# Patient Record
Sex: Female | Born: 1945
Health system: Southern US, Community
[De-identification: ages and names within clinical notes are randomized; demographics above are authoritative.]

## PROBLEM LIST (undated history)

## (undated) DIAGNOSIS — M542 Cervicalgia: Secondary | ICD-10-CM

## (undated) DIAGNOSIS — J449 Chronic obstructive pulmonary disease, unspecified: Secondary | ICD-10-CM

## (undated) DIAGNOSIS — F419 Anxiety disorder, unspecified: Secondary | ICD-10-CM

## (undated) DIAGNOSIS — I2510 Atherosclerotic heart disease of native coronary artery without angina pectoris: Secondary | ICD-10-CM

## (undated) DIAGNOSIS — I639 Cerebral infarction, unspecified: Secondary | ICD-10-CM

## (undated) DIAGNOSIS — R7303 Prediabetes: Secondary | ICD-10-CM

## (undated) DIAGNOSIS — E7800 Pure hypercholesterolemia, unspecified: Secondary | ICD-10-CM

## (undated) DIAGNOSIS — F17200 Nicotine dependence, unspecified, uncomplicated: Secondary | ICD-10-CM

## (undated) DIAGNOSIS — K5909 Other constipation: Secondary | ICD-10-CM

## (undated) DIAGNOSIS — R0902 Hypoxemia: Secondary | ICD-10-CM

## (undated) DIAGNOSIS — Z8673 Personal history of transient ischemic attack (TIA), and cerebral infarction without residual deficits: Secondary | ICD-10-CM

## (undated) DIAGNOSIS — J439 Emphysema, unspecified: Secondary | ICD-10-CM

## (undated) DIAGNOSIS — K219 Gastro-esophageal reflux disease without esophagitis: Secondary | ICD-10-CM

## (undated) DIAGNOSIS — E119 Type 2 diabetes mellitus without complications: Secondary | ICD-10-CM

## (undated) DIAGNOSIS — Z860100 Personal history of colon polyps, unspecified: Secondary | ICD-10-CM

## (undated) DIAGNOSIS — M549 Dorsalgia, unspecified: Secondary | ICD-10-CM

## (undated) DIAGNOSIS — D689 Coagulation defect, unspecified: Secondary | ICD-10-CM

## (undated) DIAGNOSIS — H409 Unspecified glaucoma: Secondary | ICD-10-CM

## (undated) DIAGNOSIS — M810 Age-related osteoporosis without current pathological fracture: Secondary | ICD-10-CM

## (undated) DIAGNOSIS — G709 Myoneural disorder, unspecified: Secondary | ICD-10-CM

## (undated) DIAGNOSIS — K5730 Diverticulosis of large intestine without perforation or abscess without bleeding: Secondary | ICD-10-CM

## (undated) DIAGNOSIS — C3490 Malignant neoplasm of unspecified part of unspecified bronchus or lung: Secondary | ICD-10-CM

## (undated) DIAGNOSIS — M1990 Unspecified osteoarthritis, unspecified site: Secondary | ICD-10-CM

## (undated) DIAGNOSIS — D735 Infarction of spleen: Secondary | ICD-10-CM

## (undated) DIAGNOSIS — H269 Unspecified cataract: Secondary | ICD-10-CM

## (undated) DIAGNOSIS — R0789 Other chest pain: Secondary | ICD-10-CM

## (undated) DIAGNOSIS — K224 Dyskinesia of esophagus: Secondary | ICD-10-CM

## (undated) DIAGNOSIS — Z8619 Personal history of other infectious and parasitic diseases: Secondary | ICD-10-CM

## (undated) HISTORY — DX: Personal history of other infectious and parasitic diseases: Z86.19

## (undated) HISTORY — DX: Atherosclerotic heart disease of native coronary artery without angina pectoris: I25.10

## (undated) HISTORY — DX: Type 2 diabetes mellitus without complications: E11.9

## (undated) HISTORY — PX: TUBAL LIGATION: SHX77

## (undated) HISTORY — DX: Coagulation defect, unspecified: D68.9

## (undated) HISTORY — DX: Personal history of colon polyps, unspecified: Z86.0100

## (undated) HISTORY — DX: Cerebral infarction, unspecified: I63.9

## (undated) HISTORY — DX: Unspecified cataract: H26.9

## (undated) HISTORY — DX: Personal history of colonic polyps: Z86.010

## (undated) HISTORY — DX: Infarction of spleen: D73.5

## (undated) HISTORY — DX: Dorsalgia, unspecified: M54.9

## (undated) HISTORY — DX: Unspecified glaucoma: H40.9

## (undated) HISTORY — DX: Pure hypercholesterolemia, unspecified: E78.00

## (undated) HISTORY — PX: CHOLECYSTECTOMY: SHX55

## (undated) HISTORY — DX: Cervicalgia: M54.2

## (undated) HISTORY — DX: Chronic obstructive pulmonary disease, unspecified: J44.9

## (undated) HISTORY — PX: BACK SURGERY: SHX140

## (undated) HISTORY — DX: Unspecified osteoarthritis, unspecified site: M19.90

## (undated) HISTORY — DX: Malignant neoplasm of unspecified part of unspecified bronchus or lung: C34.90

## (undated) HISTORY — DX: Gastro-esophageal reflux disease without esophagitis: K21.9

## (undated) HISTORY — DX: Age-related osteoporosis without current pathological fracture: M81.0

## (undated) HISTORY — DX: Nicotine dependence, unspecified, uncomplicated: F17.200

## (undated) HISTORY — DX: Myoneural disorder, unspecified: G70.9

## (undated) HISTORY — DX: Anxiety disorder, unspecified: F41.9

## (undated) HISTORY — DX: Emphysema, unspecified: J43.9

## (undated) HISTORY — DX: Hypoxemia: R09.02

## (undated) HISTORY — DX: Personal history of transient ischemic attack (TIA), and cerebral infarction without residual deficits: Z86.73

## (undated) HISTORY — DX: Other chest pain: R07.89

## (undated) HISTORY — DX: Diverticulosis of large intestine without perforation or abscess without bleeding: K57.30

## (undated) HISTORY — DX: Dyskinesia of esophagus: K22.4

## (undated) HISTORY — DX: Other constipation: K59.09

## (undated) HISTORY — DX: Prediabetes: R73.03

---

## 2007-07-28 ENCOUNTER — Observation Stay (HOSPITAL_COMMUNITY): Admission: RE | Admit: 2007-07-28 | Discharge: 2007-07-29 | Payer: Self-pay | Admitting: Neurosurgery

## 2008-05-09 ENCOUNTER — Emergency Department (HOSPITAL_COMMUNITY): Admission: EM | Admit: 2008-05-09 | Discharge: 2008-05-09 | Payer: Self-pay | Admitting: Emergency Medicine

## 2008-05-09 ENCOUNTER — Ambulatory Visit: Payer: Self-pay | Admitting: Vascular Surgery

## 2008-05-17 ENCOUNTER — Ambulatory Visit: Payer: Self-pay | Admitting: Vascular Surgery

## 2008-06-11 ENCOUNTER — Ambulatory Visit (HOSPITAL_COMMUNITY): Admission: RE | Admit: 2008-06-11 | Discharge: 2008-06-11 | Payer: Self-pay | Admitting: Family Medicine

## 2008-06-26 ENCOUNTER — Ambulatory Visit: Payer: Self-pay | Admitting: Vascular Surgery

## 2008-07-02 ENCOUNTER — Ambulatory Visit: Payer: Self-pay | Admitting: Vascular Surgery

## 2008-07-02 ENCOUNTER — Ambulatory Visit (HOSPITAL_COMMUNITY): Admission: RE | Admit: 2008-07-02 | Discharge: 2008-07-02 | Payer: Self-pay | Admitting: Vascular Surgery

## 2010-08-26 LAB — POCT I-STAT, CHEM 8
Chloride: 110 mEq/L (ref 96–112)
Glucose, Bld: 102 mg/dL — ABNORMAL HIGH (ref 70–99)
HCT: 46 % (ref 36.0–46.0)
Hemoglobin: 15.6 g/dL — ABNORMAL HIGH (ref 12.0–15.0)
Potassium: 3.9 mEq/L (ref 3.5–5.1)
Sodium: 142 mEq/L (ref 135–145)

## 2010-09-23 NOTE — Op Note (Signed)
Bailey Nguyen, Bailey Nguyen              ACCOUNT NO.:  [id number]   MEDICAL RECORD NO.:  [id number]          PATIENT TYPE:  AMB   LOCATION:  SDS                          FACILITY:  [facility]   PHYSICIAN:  Di Kindle. Edilia Bo, M.D.DATE OF BIRTH:  1945/07/11   DATE OF PROCEDURE:  07/02/2008  DATE OF DISCHARGE:  07/02/2008                               OPERATIVE REPORT   PREOPERATIVE DIAGNOSIS:  Pain in the left foot.   POSTOPERATIVE DIAGNOSIS:  Pain in the left foot.   PROCEDURE:  1. Ultrasound-guided access to the right common femoral artery.  2. Thoracic aortogram.  3. Aortogram with bilateral iliac arteriogram and bilateral lower      extremity runoff.  4. Selective catheterization of the left external iliac artery.   SURGEON:  Di Kindle. Edilia Bo, MD   ANESTHESIA:  Local with sedation.   INDICATIONS:  This is a pleasant 65 year old woman who developed fairly  sudden onset of left calf pain and then subsequently pain in the dorsum  of her foot in late December 2009.  She had no history of atrial  fibrillation or myocardial infarction or other potential source of  embolization.  She later had presented to the emergency department  because of persistent pain and at that time had good Doppler flow in the  anterior tibial and posterior tibial positions.  Her pain persisted and  she is brought in for diagnostic arteriography to assess for possible  embolic disease to the left leg.  Of note, in January, 2010, she had  ABIs of 100% bilaterally with triphasic Doppler signals noted in the  posterior tibial and anterior tibial positions on the right and biphasic  signals noted on the left.   TECHNIQUE:  The patient was taken to the [facility] and sedated with a  milligram of Versed and 50 mcg of fentanyl.  Both groins were prepped  and draped in the usual sterile fashion.  After the skin was infiltrated  with 1% lidocaine and under ultrasound guidance, the right common  femoral artery was  cannulated and guidewire was introduced into the  infrarenal aorta under fluoroscopic control.  A 5-French sheath was  introduced over the wire.  Pigtail catheter was positioned at the L1  vertebral body and flush aortogram was obtained.  The catheter was then  positioned above the aortic bifurcation after a lateral projection was  obtained and oblique iliac projections were obtained.  Next, the pigtail  catheter was exchanged for an IMA catheter which was positioned into the  left common iliac artery.  The wire was advanced to the common femoral  artery and then the IMA catheter as exchanged for an end-hole catheter  which was advanced down into the external iliac artery on the left.  Selective left external iliac arteriogram was obtained and left lower  extremity runoff.  Next, the end-hole catheter was exchanged for a  pigtail catheter which was positioned in the proximal descending  thoracic aorta and thoracic aortogram was obtained.  The pigtail  catheter was then removed over the wire.  Additional right lower  extremity films were obtained through the  right femoral sheath.   FINDINGS:  The thoracic aorta is widely patent without evidence of  atherosclerotic disease and no evidence of the source for embolization.  There are single renal arteries bilaterally with no significant renal  artery stenosis identified.  The infrarenal aorta, bilateral common  iliac arteries, bilateral hypogastric arteries, and bilateral external  iliac arteries are widely patent without evidence of atherosclerosis.  On the right side, the common femoral, deep femoral, superficial  femoropopliteal, anterior tib, posterior tibial, and peroneal arteries  are all patent with no significant infrainguinal arterial occlusive  disease.   On the left side, the common femoral, superficial femoral, and deep  femoropopliteal vessels are all patent without evidence of  atherosclerotic disease or potential source of  embolization.  The  anterior tibial artery is occluded at the ankle.  The posterior tibial  artery is occluded just above the ankle.  There are collaterals in the  foot.   CONCLUSIONS:  No evidence of potential source for atheroembolic disease  from the descending thoracic aorta down to the ankle on the left.  Abrupt occlusion of the dorsalis pedis artery and posterior tibial  artery at the ankle on the left.  No significant infrainguinal arterial  occlusive disease on the right.  This patient appears to have evidence  of atheroembolic disease to the distal vessels on the left with no  arterial source identified.  A 2-D echo or transesophageal echo would be  recommended to evaluate for potential embolic source.  If this was  unremarkable, the patient should be considered for hypercoagulable  workup.      Di Kindle. Edilia Bo, M.D.  Electronically Signed     CSD/MEDQ  D:  07/02/2008  T:  07/02/2008  Job:  [id number]   cc:   Burnell Blanks, MD  Lonie Peak, PA-C

## 2010-09-23 NOTE — Consult Note (Signed)
Bailey Nguyen, COLTRAIN NO.:  [id number]   MEDICAL RECORD NO.:  [id number]          PATIENT TYPE:  EMS   LOCATION:  MAJO                         FACILITY:  [facility]   PHYSICIAN:  Di Kindle. Edilia Bo, M.D.DATE OF BIRTH:  Dec 19, 1945   DATE OF CONSULTATION:  DATE OF DISCHARGE:                                 CONSULTATION   REASON FOR CONSULTATION:  Left foot pain.   HISTORY:  This is a 65 year old woman who approximately a week ago  developed a fairly sudden onset of left calf pain and then subsequently  the pain migrated to the dorsum of her left foot.  Pain has been  persistent and got to the point where she could not put weight on the  foot.  She went to the emergency department in __________ and the  physician there had discussed the patient with me because he was  concerned he could not palpate pulses.  I asked that the patient be  transferred to [facility]; however, she had signed out against medical advice.  She then drove to [facility] on her own.  She did not want to come by  ambulance.   Prior to developing the sudden onset of pain a week ago, she denied any  history of claudication, rest pain, or previous nonhealing ulcers.  She  has had no history of previous DVT or phlebitis.  She has no history of  atrial fibrillation or recent myocardial infarction that suggest a  source for embolization.  She notes that when she developed the pain in  her left calf, she did not notice any discoloration of her foot or  coolness to the foot.  She did undergo a venous duplex scan 4 days ago  according to the patient and this was reportedly unremarkable.  She  states that they evaluated the thigh and behind the knee it sounds like  they did not evaluate the calf.  She does not remember any specific  injury to the left foot.  She has had no symptoms on the right leg.   Her past medical history is significant for COPD.  She denies any  history of diabetes, hypertension,  hypercholesterolemia, or history of  previous myocardial infarction.   PAST SURGICAL HISTORY:  She has had lumbar back surgery in March 2009.   MEDICATIONS:  1. Nexium 40 mg p.o. b.i.d.  2. Zantac 150 mg p.o. nightly.  3. She also takes eye drops, she did not know the name.   SOCIAL HISTORY:  She is married.  She has four living children.  She  smokes one and a half packs per day of cigarettes and is smoking since  she was 65 year old.   REVIEW OF SYSTEMS:  GENERAL:  She has had no recent weight loss or  weight gain, problem with her appetite or fever.  CARDIAC:  She has had  no chest pain, chest pressure, palpitations, or arrhythmias.  PULMONARY:  She has had no productive cough, bronchitis, asthma, or wheezing.  She  does have a history of COPD.  GI:  She has had no recent change in her  bowel habits and has no history of peptic ulcer disease.  GU:  She has  had no dysuria or frequency.  HEMATOLOGIC:  She does tell me that in  December 2006, she had a splenic infarct presumably embolic.  She has no  clotting disorders that she is aware of.  NEURO:  She had no dizziness,  blackouts, headaches, or seizures.  VASCULAR:  She has had no  claudication, rest pain, or nonhealing ulcers.  She has had no previous  history of DVT or phlebitis.  ENT:  She has had no recent change in her  eyesight or hearing.  PSYCHIATRIC:  She has had no nervousness or  depression.   PHYSICAL EXAMINATION:  GENERAL:  This is a pleasant 65 year old woman  who appears her stated age.  VITAL SIGNS:  Her heart rate is 70, blood pressure is 120/60.  NECK:  Supple.  There is no cervical lymphadenopathy.  I do not detect  any carotid bruits.  LUNGS:  Clear bilaterally to auscultation.  CARDIAC:  She has a regular rate and rhythm without murmur appreciated.  ABDOMEN:  Soft and nontender.  I cannot palpate an aneurysm.  She has  palpable femoral pulses and palpable popliteal pulses.  On the left side  which is the  symptomatic side, she has a palpable posterior tibial  pulse.  I cannot palpate a dorsalis pedis pulse.  She does have Doppler  signals in the left foot and dorsalis pedis, posterior tibial, and  peroneal position.  These are monophasic.  On the right side, she has a  palpable dorsalis pedis and posterior tibial pulse.  Both feet appear  warm and well perfused, are pink in color, with good capillary refill.  There is no modeling or discoloration.  There is no evidence of  atheroembolic disease.  There is some mild cellulitis on the dorsum of  the foot on the left.  There is no significant lower extremity swelling.   This patient presents with left foot pain of an known etiology.  She is  not aware of any trauma to the area.  We will obtain an x-ray to rule  out a fracture or arthritis.  This could simply be arthritis or gout.  However, I have recommended that she obtain a followup duplex scan next  week.  If she had a small tibial clot which was missed on her initial  study, this would allow [place] to reassess this and also be sure there was  no propagation of clot if there was tibial clot, which was missed.  Less  likely she has had an embolic event to her tibials; however, she has a  palpable popliteal and posterior tibial pulse on the left with good  Doppler flow in the peroneal and posterior and dorsalis pedis artery.  If her symptoms persist and no other etiology can be found and her  venous duplex is unremarkable, then we could consider arteriography.  We  discussed potentially keeping her in the hospital and doing at this  admission; however, I felt that the yield was quite low and clearly she  had good perfusion of the foot at this point with a palpable posterior  tibial pulse.  She will follow up with her primary care physician  tomorrow and have given her notes, so that she can arrange the followup  venous duplex and ABIs.  I have also instructed her to take two Aleve  tonight and  one in the morning.  I have also  given her my card, so that  she can call for symptoms persisted.  We could consider arteriography.      Di Kindle. Edilia Bo, M.D.  Electronically Signed     CSD/MEDQ  D:  05/09/2008  T:  05/10/2008  Job:  [id number]

## 2010-09-23 NOTE — Assessment & Plan Note (Signed)
OFFICE VISIT   Bailey Nguyen, Bailey Nguyen  DOB:  1945-07-15                                       06/26/2008  ZOXWR#:[id number]   The patient was last seen in December of 2009.  At that time she stated  that she had developed a fairly sudden onset of left calf pain and then  subsequently pain on the dorsum of her left foot that began in late  December.  The pain was persistent and she was having difficulty putting  weight on her foot.  She denied any history of claudication, rest pain  or history of nonhealing ulcers.  She has had no previous history of DVT  or phlebitis.  She has had no history of atrial fibrillation or recent  myocardial infarction to suggest a source of embolization.  She has had  a venous duplex scan after this pain developed which showed no evidence  of DVT.   The patient's pain had improved somewhat but never resolved completely.  She did have a nuclear medicine study that showed that there was no  evidence of stress fracture.  However, she does complain that the foot  becomes quite cold and numb at times and she is continuing to have pain  on the dorsum of her foot with no real calf pain.   PAST MEDICAL HISTORY:  Significant for COPD.  She denies any history of  diabetes, hypertension, hypercholesterolemia, or history of previous  myocardial infarction.   SOCIAL HISTORY:  She smokes one and a half packs per day of cigarettes  and has been smoking since she was 16.   REVIEW OF SYSTEMS:  She has had no recent chest pain, chest pressure,  palpitations or arrhythmias.  She has had no bronchitis, asthma or  wheezing.   PHYSICAL EXAMINATION:  General:  On physical examination this is a  pleasant 65 year old woman who appears her stated age.  Vital signs:  Blood pressure is 129/76, heart rate is 72.  Neck:  Is supple.  I do not  detect any carotid bruits.  She has palpable femoral and popliteal  pulses bilaterally.  On the right side she has a  palpable dorsalis pedis  and posterior tibial pulse.  On the left side I cannot palpate a  dorsalis pedis or posterior tibial pulse.  She has a monophasic  posterior tibial, anterior tibial and peroneal signal on the left with  the Doppler.  The left was cooler than the right.   Of note, she did have Doppler studies in our office in January on  05/17/2008 which showed ABIs of greater than 100% bilaterally with  triphasic Doppler signals in the posterior tibial and anterior tibial  position on the right and biphasic Doppler signals in the posterior  tibial and anterior tibial positions on the left.   Based on my current exam I think that her exam has changed compared to  her previous exam in December of 2009.  I could palpate a posterior  tibial pulse before which I cannot palpate now.  In addition, she has  monophasic signals in the left foot.  Given that there has been no clear  cut etiology for this pain which has been somewhat puzzling I have  recommended we proceed with arteriography to further assess this.  She  could potentially have distal atherosclerotic disease related to her  smoking or possibly embolic disease although she has had no good reason  to embolize unless there is a proximal lesion.  I have discussed the  indication for arteriography and the potential complications of the  procedure including but not limited to arterial injury, bleeding and  renal insufficiency.  All of her questions were answered.  She is  agreeable to proceed.  The procedure has been scheduled for 07/02/2008.  I explained that if we did find any stenosis amenable to angioplasty and  stenting this could potentially be addressed at the same time.  I will  make further recommendations pending these results.   Di Kindle. Edilia Bo, M.D.  Electronically Signed   CSD/MEDQ  D:  06/26/2008  T:  06/27/2008  Job:  [id number]   cc:   Lonie Peak

## 2010-09-23 NOTE — Op Note (Signed)
NAMEBERNADEAN, SALING NO.:  [id number]   MEDICAL RECORD NO.:  [id number]          PATIENT TYPE:  OBV   LOCATION:  3599                         FACILITY:  [facility]   PHYSICIAN:  Reinaldo Meeker, M.D. DATE OF BIRTH:  1946/01/23   DATE OF PROCEDURE:  07/28/2007  DATE OF DISCHARGE:                               OPERATIVE REPORT   PREOPERATIVE DIAGNOSIS:  Herniated disk L5-S1 left.   POSTOPERATIVE DIAGNOSIS:  Herniated disk L5-S1 left.   PROCEDURES:  1. Left L5-S1 interlaminar laminotomy for excision of herniated disk      with the operating microscope  2. Microdissection L5-S1 disk and S1 nerve root.   SURGEON:  Reinaldo Meeker, M.D.   ASSISTANT:  Donalee Citrin, M.D.   PROCEDURE IN DETAIL:  After being placed in the prone position, the  patient's back was prepped and draped in usual sterile fashion.  Localizing x-ray was taken prior to incision to identify the appropriate  level.  Midline incision was made over the spinous process of L5 and S1.  Using Bovie cutting current, the incision was carried down the spinous  processes.  Subperiosteal dissection was then carried out on left-sided  spinous processes and lamina and self retractor was placed for exposure.  X-ray showed approach of the appropriate level.  Using the high-speed  drill, the inferior half of the L5 lamina, medial third of facet joint  removed.  It was then used to remove the superior one third of the S1  lamina.  Residual bone, ligamentum flavum removed in a piecemeal  fashion.  Microscope was draped, brought in the field used and used  through the remainder of the case.  Using microdissection technique, the  lateral aspect of the thecal sac and S1 nerve root were identified.  Further coagulation was carried down the floor of the canal to identify  the L5-S1 disk which was found to be markedly herniated with the  superior fragment.  Fragment was then removed in the single piece.  The  disk space  entered with a 15 blade.  Using pituitary rongeurs and  curettes thorough disk space clean-out was carried out, while at the  same time great care was taken to avoid injury to the neural elements  and this was successfully done.  At this point, inspection was carried  out in all directions for any evidence of residual compression and none  could be identified.  Large amounts of irrigation was carried out.  Any  bleeding controlled with bipolar coagulation and Gelfoam.  The wound was  then closed in multiple layers of Vicryl on the muscle fascia,  subcutaneous and subcu tissues and staples were placed on the skin.  A  sterile dressing was then applied and the patient was extubated and  taken to the recovery room in stable condition.           ______________________________  Reinaldo Meeker, M.D.     ROK/MEDQ  D:  07/28/2007  T:  07/28/2007  Job:  [id number]

## 2010-09-26 NOTE — Letter (Signed)
August 21, 2008    Re:  Nguyen, Bailey                DOB:  [birth year]    Burnell Blanks, MD  [facility]  [address]  [address] [place], [place] [address]   Dear Dr. Nathanial Rancher,   This is a follow-up letter on Ms. Kittel.  I had seen her in consultation  concerning pain in the right foot, which was fairly sudden in onset.  We  felt that she could have potentially had some embolic disease, and she  underwent diagnostic arteriography on 07/02/08 to evaluate for a  potential of embolization.  There was no area of stenosis or  irregularity from the thoracic aorta down through the tibials  bilaterally to suggest a source of embolization; however, on the left  side where she was having pain, she did have an abrupt occlusion of the  posterior tibial and anterior tibial arteries at the level of the foot  with collaterals in the foot.   As no source of embolization could be found in her arteriogram, if she  has not had a 2D echo, I think this could potentially be useful.  I have  also previously had discussions with her about the importance of tobacco  cessation.  I plan on seeing her back p.r.n.   Di Kindle. Edilia Bo, M.D.  Electronically Signed   CSD/MEDQ  D:  08/21/2008  T:  08/22/2008  Job:  [id number]   cc:   Lonie Peak

## 2011-02-02 LAB — BASIC METABOLIC PANEL
Chloride: 107
GFR calc non Af Amer: >60
Potassium: 3.9
Sodium: 141

## 2011-02-02 LAB — CBC
HCT: 41.6
Hemoglobin: 14.5
MCV: 94.9
RBC: 4.39
WBC: 6.3

## 2011-12-02 ENCOUNTER — Ambulatory Visit: Payer: Self-pay | Admitting: Unknown Physician Specialty

## 2011-12-16 ENCOUNTER — Ambulatory Visit: Payer: Self-pay | Admitting: Unknown Physician Specialty

## 2012-02-25 ENCOUNTER — Other Ambulatory Visit: Payer: Self-pay | Admitting: Oncology

## 2012-06-10 DIAGNOSIS — Z79899 Other long term (current) drug therapy: Secondary | ICD-10-CM | POA: Diagnosis not present

## 2012-06-10 DIAGNOSIS — E7800 Pure hypercholesterolemia, unspecified: Secondary | ICD-10-CM | POA: Diagnosis not present

## 2012-06-10 DIAGNOSIS — M549 Dorsalgia, unspecified: Secondary | ICD-10-CM | POA: Diagnosis not present

## 2012-06-10 DIAGNOSIS — K5900 Constipation, unspecified: Secondary | ICD-10-CM | POA: Diagnosis not present

## 2012-06-28 DIAGNOSIS — Z1211 Encounter for screening for malignant neoplasm of colon: Secondary | ICD-10-CM | POA: Diagnosis not present

## 2012-06-28 DIAGNOSIS — K219 Gastro-esophageal reflux disease without esophagitis: Secondary | ICD-10-CM | POA: Diagnosis not present

## 2012-06-28 DIAGNOSIS — K5900 Constipation, unspecified: Secondary | ICD-10-CM | POA: Diagnosis not present

## 2012-08-09 DIAGNOSIS — N390 Urinary tract infection, site not specified: Secondary | ICD-10-CM | POA: Diagnosis not present

## 2012-08-09 DIAGNOSIS — Z6826 Body mass index (BMI) 26.0-26.9, adult: Secondary | ICD-10-CM | POA: Diagnosis not present

## 2012-08-12 DIAGNOSIS — I10 Essential (primary) hypertension: Secondary | ICD-10-CM | POA: Diagnosis not present

## 2012-08-12 DIAGNOSIS — R1013 Epigastric pain: Secondary | ICD-10-CM | POA: Diagnosis not present

## 2012-08-12 DIAGNOSIS — K5900 Constipation, unspecified: Secondary | ICD-10-CM | POA: Diagnosis not present

## 2012-08-12 DIAGNOSIS — R1012 Left upper quadrant pain: Secondary | ICD-10-CM | POA: Diagnosis not present

## 2012-08-12 DIAGNOSIS — R0602 Shortness of breath: Secondary | ICD-10-CM | POA: Diagnosis not present

## 2012-08-12 DIAGNOSIS — K219 Gastro-esophageal reflux disease without esophagitis: Secondary | ICD-10-CM | POA: Diagnosis not present

## 2012-08-12 DIAGNOSIS — Z79899 Other long term (current) drug therapy: Secondary | ICD-10-CM | POA: Diagnosis not present

## 2012-08-12 DIAGNOSIS — M549 Dorsalgia, unspecified: Secondary | ICD-10-CM | POA: Diagnosis not present

## 2012-08-16 DIAGNOSIS — R1012 Left upper quadrant pain: Secondary | ICD-10-CM | POA: Diagnosis not present

## 2012-08-16 DIAGNOSIS — M546 Pain in thoracic spine: Secondary | ICD-10-CM | POA: Diagnosis not present

## 2012-08-19 ENCOUNTER — Other Ambulatory Visit: Payer: Self-pay | Admitting: Family Medicine

## 2012-08-19 DIAGNOSIS — M546 Pain in thoracic spine: Secondary | ICD-10-CM

## 2012-08-20 ENCOUNTER — Ambulatory Visit
Admission: RE | Admit: 2012-08-20 | Discharge: 2012-08-20 | Disposition: A | Discharge status: Home / Self Care | Payer: Medicare Other | Source: Ambulatory Visit | Attending: Family Medicine | Admitting: Family Medicine

## 2012-08-20 DIAGNOSIS — M546 Pain in thoracic spine: Secondary | ICD-10-CM

## 2012-08-20 DIAGNOSIS — IMO0002 Reserved for concepts with insufficient information to code with codable children: Secondary | ICD-10-CM | POA: Diagnosis not present

## 2012-08-20 DIAGNOSIS — M5124 Other intervertebral disc displacement, thoracic region: Secondary | ICD-10-CM | POA: Diagnosis not present

## 2012-08-24 DIAGNOSIS — R1012 Left upper quadrant pain: Secondary | ICD-10-CM | POA: Diagnosis not present

## 2012-08-24 DIAGNOSIS — Z6826 Body mass index (BMI) 26.0-26.9, adult: Secondary | ICD-10-CM | POA: Diagnosis not present

## 2012-08-24 DIAGNOSIS — R1032 Left lower quadrant pain: Secondary | ICD-10-CM | POA: Diagnosis not present

## 2012-08-24 DIAGNOSIS — Z79899 Other long term (current) drug therapy: Secondary | ICD-10-CM | POA: Diagnosis not present

## 2012-11-21 DIAGNOSIS — J029 Acute pharyngitis, unspecified: Secondary | ICD-10-CM | POA: Diagnosis not present

## 2012-11-23 IMAGING — CT CT NECK WITH CONTRAST
2 series · 10 of 14 positions shown, 12 images · IV contrast (isovue)
Comparison: none

REASON FOR EXAM: abnormal US   cyst  seen on left of midline on neck
COMMENTS:

PROCEDURE:     KCT - KCT NECK WITH CONTRAST  - December 16, 2011 [DATE]
RESULT:     Comparison: Soft tissue ultrasound 12/02/2011
TECHNIQUE: Multiple sequential axial images from the apices of the lungs to
the level of the orbits obtained with 75 mL Isovue 370 IV contrast.

[Series 2: neck 3.0 3 · axial · 0.42mm/px · z∈[-253,-1]mm · 8 of 108 slices shown, 10 images]
[im 12/108  soft-tissue]
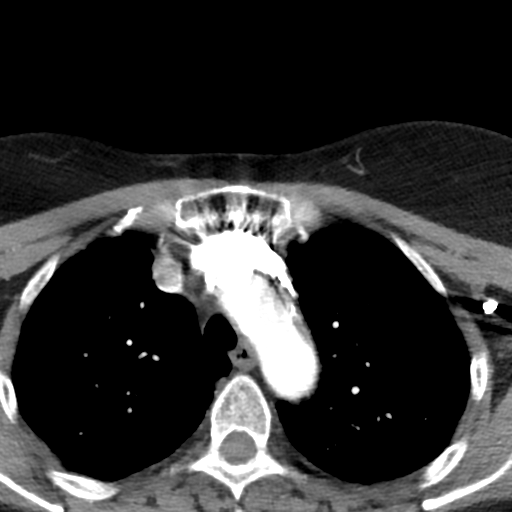
[im 12/108  bone]
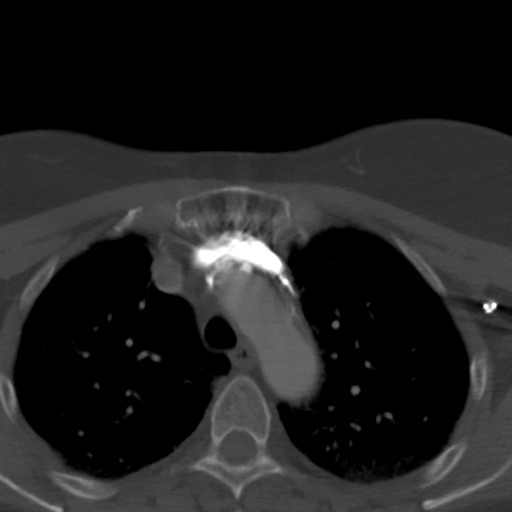
[im 24/108  bone]
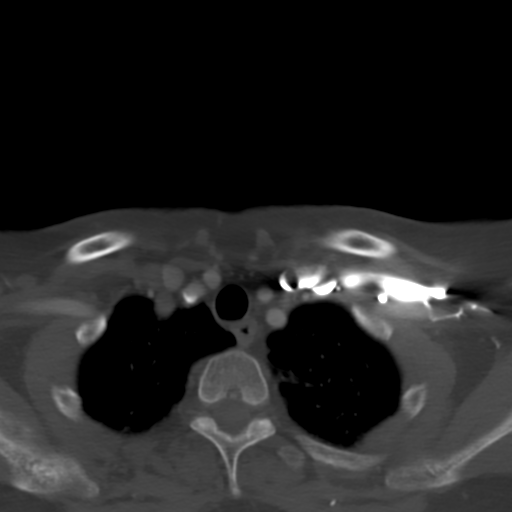
[im 36/108  bone]
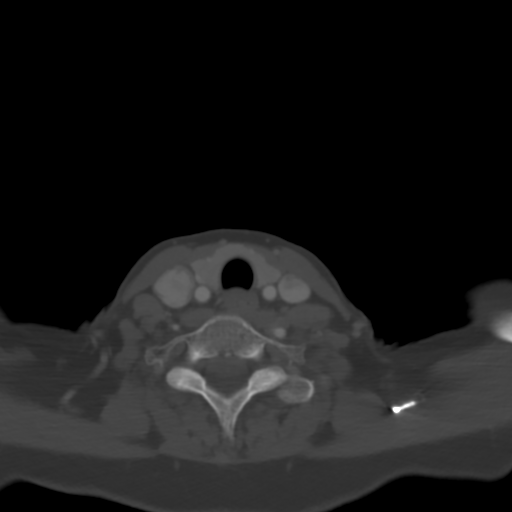
[im 48/108  bone]
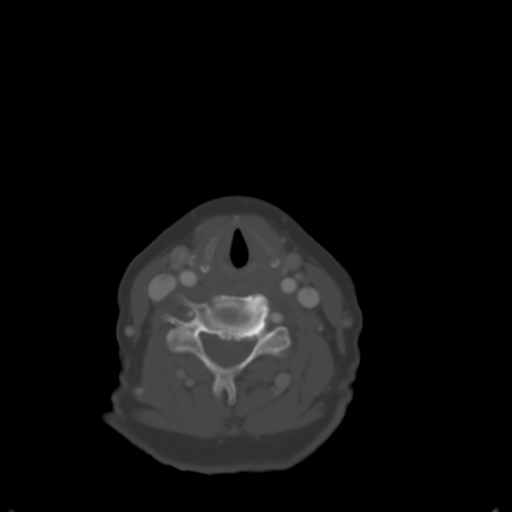
[im 60/108  soft-tissue]
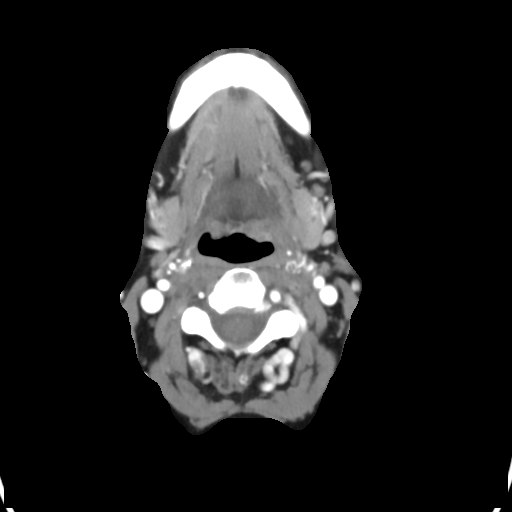
[im 60/108  bone]
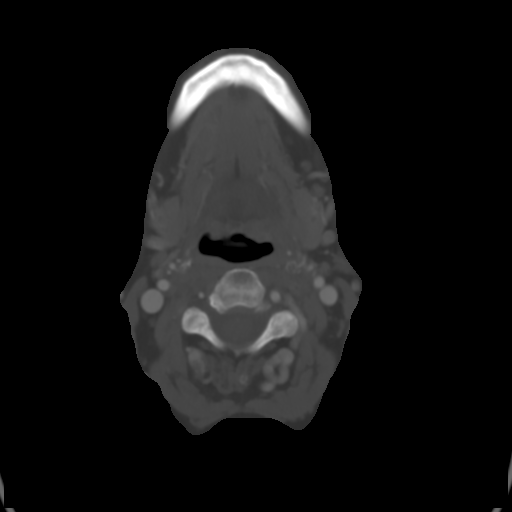
[im 72/108  bone]
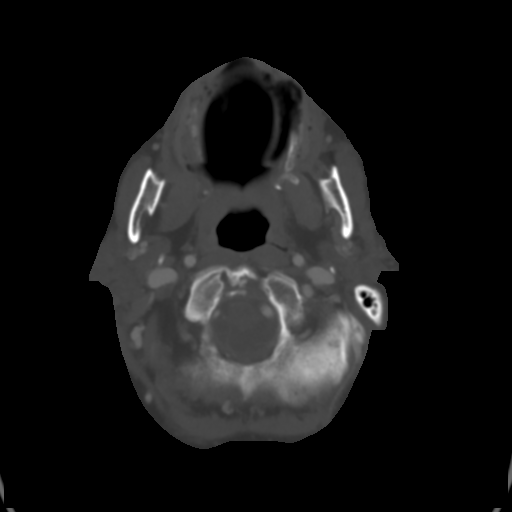
[im 84/108  bone]
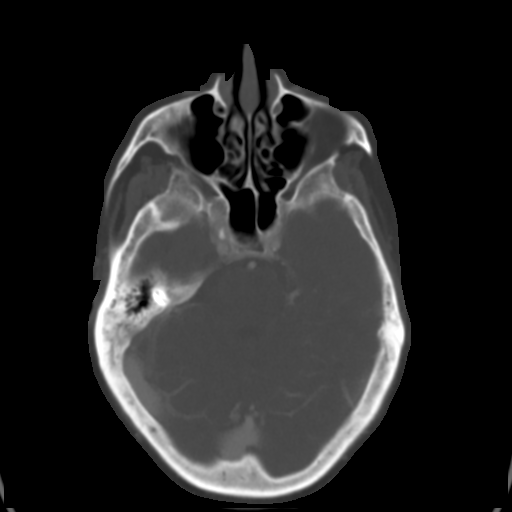
[im 96/108  bone]
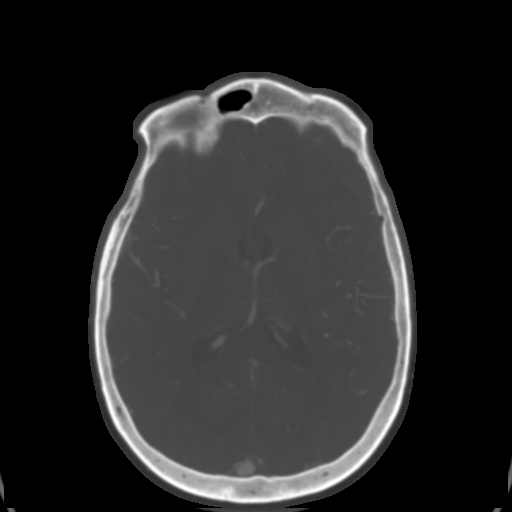

[Series 4: lung · axial · 0.60mm/px · z∈[-247,-208]mm · 2 of 40 slices shown]
[im 14/40  bone]
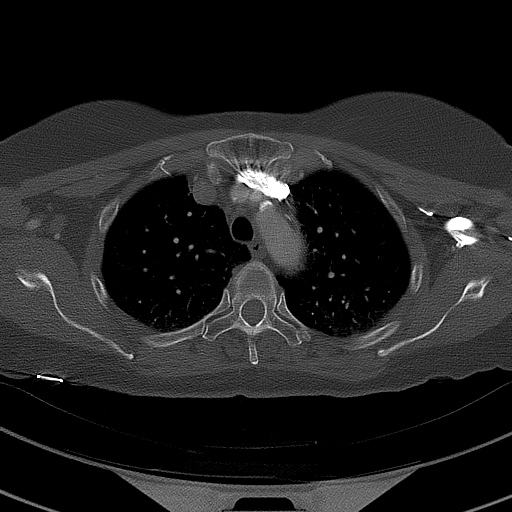
[im 27/40  bone]
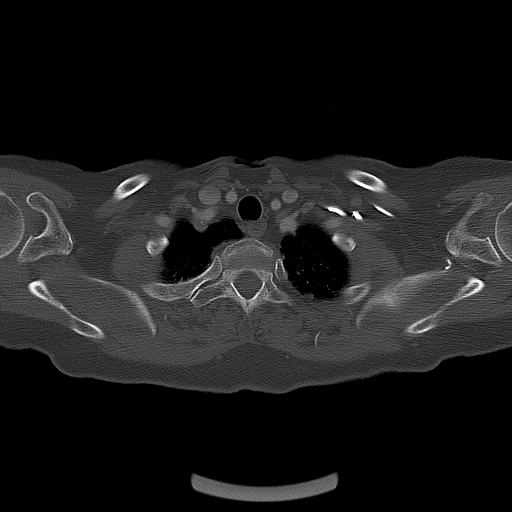

[10 of 14 positions shown; findings below may reference images not displayed]

FINDINGS: There is a small, cystic mass in the soft tissues just anterior to the
epiglottis to the left of midline, as seen on image 56. It measures 0.8 x
0.9 cm in greatest axial dimension. It is unclear if this is the cystic
structure seen on the prior ultrasound, though no other cystic mass is
identified inferior under the chin. The size measurements are similar to the
mass seen on the ultrasound.

No lymphadenopathy identified in the neck. The airway is patent. There are
several borderline enlarged mediastinal lymph nodes. The largest is a
precarinal lymph node which measures 0.9 x 0.5 cm. There is mild
centrilobular emphysema. Dependent ground glass opacities in the upper lobes
likely secondary to atelectasis. There is a small focal area of ground glass
opacity along the periphery of the right lung which measures approximately 9
mm in greatest dimension. This is seen on image 37. There is a 5 mm nodular
density in the medial left lung apex which is likely related to areas
scarring, image 16.

No aggressive lytic or sclerotic osseous lesions are identified.
IMPRESSION: 1. Indeterminate subcentimeter cystic mass just anterior to the base of the
epiglottis, to the left of midline. It is unclear if this is the cystic
structure seen on the prior ultrasound, though no other cystic mass is seen
and the measurements are similar. Further evaluation with direct
visualization is suggested.
2. Borderline enlarged mediastinal nodes which are nonspecific. There is a
small focal subpleural ground glass opacity in the periphery of the right
lung which may be related atelectasis or an infectious or inflammatory
process. Additionally, there is a 5 mm nodular density at the left lung apex
which may be related to scarring. However, a followup noncontrast chest CT
is recommended in 3 months to ensure stability/resolution of the
aforementioned findings.

## 2012-12-20 DIAGNOSIS — R05 Cough: Secondary | ICD-10-CM | POA: Diagnosis not present

## 2012-12-20 DIAGNOSIS — E7800 Pure hypercholesterolemia, unspecified: Secondary | ICD-10-CM | POA: Diagnosis not present

## 2012-12-20 DIAGNOSIS — K219 Gastro-esophageal reflux disease without esophagitis: Secondary | ICD-10-CM | POA: Diagnosis not present

## 2012-12-20 DIAGNOSIS — Z79899 Other long term (current) drug therapy: Secondary | ICD-10-CM | POA: Diagnosis not present

## 2012-12-20 DIAGNOSIS — M549 Dorsalgia, unspecified: Secondary | ICD-10-CM | POA: Diagnosis not present

## 2013-04-19 DIAGNOSIS — M549 Dorsalgia, unspecified: Secondary | ICD-10-CM | POA: Diagnosis not present

## 2013-04-19 DIAGNOSIS — Z79899 Other long term (current) drug therapy: Secondary | ICD-10-CM | POA: Diagnosis not present

## 2013-04-19 DIAGNOSIS — E7800 Pure hypercholesterolemia, unspecified: Secondary | ICD-10-CM | POA: Diagnosis not present

## 2013-04-19 DIAGNOSIS — K219 Gastro-esophageal reflux disease without esophagitis: Secondary | ICD-10-CM | POA: Diagnosis not present

## 2013-04-19 DIAGNOSIS — H6120 Impacted cerumen, unspecified ear: Secondary | ICD-10-CM | POA: Diagnosis not present

## 2013-04-19 DIAGNOSIS — J069 Acute upper respiratory infection, unspecified: Secondary | ICD-10-CM | POA: Diagnosis not present

## 2013-05-08 DIAGNOSIS — L820 Inflamed seborrheic keratosis: Secondary | ICD-10-CM | POA: Diagnosis not present

## 2013-05-08 DIAGNOSIS — L821 Other seborrheic keratosis: Secondary | ICD-10-CM | POA: Diagnosis not present

## 2013-05-24 DIAGNOSIS — T65291A Toxic effect of other tobacco and nicotine, accidental (unintentional), initial encounter: Secondary | ICD-10-CM | POA: Diagnosis not present

## 2013-05-24 DIAGNOSIS — K140 Glossitis: Secondary | ICD-10-CM | POA: Diagnosis not present

## 2013-05-24 DIAGNOSIS — K120 Recurrent oral aphthae: Secondary | ICD-10-CM | POA: Diagnosis not present

## 2013-05-24 DIAGNOSIS — L0201 Cutaneous abscess of face: Secondary | ICD-10-CM | POA: Diagnosis not present

## 2013-05-24 DIAGNOSIS — L03211 Cellulitis of face: Secondary | ICD-10-CM | POA: Diagnosis not present

## 2013-08-29 DIAGNOSIS — K5900 Constipation, unspecified: Secondary | ICD-10-CM | POA: Diagnosis not present

## 2013-08-29 DIAGNOSIS — Z1331 Encounter for screening for depression: Secondary | ICD-10-CM | POA: Diagnosis not present

## 2013-08-29 DIAGNOSIS — Z6825 Body mass index (BMI) 25.0-25.9, adult: Secondary | ICD-10-CM | POA: Diagnosis not present

## 2013-08-29 DIAGNOSIS — Z9181 History of falling: Secondary | ICD-10-CM | POA: Diagnosis not present

## 2013-08-29 DIAGNOSIS — R109 Unspecified abdominal pain: Secondary | ICD-10-CM | POA: Diagnosis not present

## 2013-09-12 DIAGNOSIS — K5730 Diverticulosis of large intestine without perforation or abscess without bleeding: Secondary | ICD-10-CM | POA: Diagnosis not present

## 2013-09-12 DIAGNOSIS — K5900 Constipation, unspecified: Secondary | ICD-10-CM | POA: Diagnosis not present

## 2013-09-12 DIAGNOSIS — R109 Unspecified abdominal pain: Secondary | ICD-10-CM | POA: Diagnosis not present

## 2013-09-12 DIAGNOSIS — R1012 Left upper quadrant pain: Secondary | ICD-10-CM | POA: Diagnosis not present

## 2013-09-12 DIAGNOSIS — R1032 Left lower quadrant pain: Secondary | ICD-10-CM | POA: Diagnosis not present

## 2013-09-22 DIAGNOSIS — K648 Other hemorrhoids: Secondary | ICD-10-CM | POA: Diagnosis not present

## 2013-09-22 DIAGNOSIS — K5730 Diverticulosis of large intestine without perforation or abscess without bleeding: Secondary | ICD-10-CM | POA: Diagnosis not present

## 2013-09-22 DIAGNOSIS — R109 Unspecified abdominal pain: Secondary | ICD-10-CM | POA: Diagnosis not present

## 2013-09-22 DIAGNOSIS — R1032 Left lower quadrant pain: Secondary | ICD-10-CM | POA: Diagnosis not present

## 2013-09-22 DIAGNOSIS — Z8719 Personal history of other diseases of the digestive system: Secondary | ICD-10-CM | POA: Diagnosis not present

## 2013-09-22 DIAGNOSIS — K5900 Constipation, unspecified: Secondary | ICD-10-CM | POA: Diagnosis not present

## 2013-09-22 HISTORY — PX: COLONOSCOPY: SHX174

## 2013-10-18 DIAGNOSIS — R21 Rash and other nonspecific skin eruption: Secondary | ICD-10-CM | POA: Diagnosis not present

## 2013-10-24 DIAGNOSIS — R7309 Other abnormal glucose: Secondary | ICD-10-CM | POA: Diagnosis not present

## 2013-10-24 DIAGNOSIS — E7800 Pure hypercholesterolemia, unspecified: Secondary | ICD-10-CM | POA: Diagnosis not present

## 2013-11-30 DIAGNOSIS — H409 Unspecified glaucoma: Secondary | ICD-10-CM | POA: Diagnosis not present

## 2013-11-30 DIAGNOSIS — H2510 Age-related nuclear cataract, unspecified eye: Secondary | ICD-10-CM | POA: Diagnosis not present

## 2013-12-18 DIAGNOSIS — H2589 Other age-related cataract: Secondary | ICD-10-CM | POA: Diagnosis not present

## 2013-12-18 DIAGNOSIS — H269 Unspecified cataract: Secondary | ICD-10-CM | POA: Diagnosis not present

## 2013-12-18 DIAGNOSIS — H409 Unspecified glaucoma: Secondary | ICD-10-CM | POA: Diagnosis not present

## 2013-12-18 DIAGNOSIS — H4011X0 Primary open-angle glaucoma, stage unspecified: Secondary | ICD-10-CM | POA: Diagnosis not present

## 2013-12-18 DIAGNOSIS — H2510 Age-related nuclear cataract, unspecified eye: Secondary | ICD-10-CM | POA: Diagnosis not present

## 2014-02-05 DIAGNOSIS — H409 Unspecified glaucoma: Secondary | ICD-10-CM | POA: Diagnosis not present

## 2014-02-05 DIAGNOSIS — H2510 Age-related nuclear cataract, unspecified eye: Secondary | ICD-10-CM | POA: Diagnosis not present

## 2014-02-05 DIAGNOSIS — H269 Unspecified cataract: Secondary | ICD-10-CM | POA: Diagnosis not present

## 2014-02-05 DIAGNOSIS — H2589 Other age-related cataract: Secondary | ICD-10-CM | POA: Diagnosis not present

## 2014-02-05 DIAGNOSIS — H4010X0 Unspecified open-angle glaucoma, stage unspecified: Secondary | ICD-10-CM | POA: Diagnosis not present

## 2014-03-20 DIAGNOSIS — M549 Dorsalgia, unspecified: Secondary | ICD-10-CM | POA: Diagnosis not present

## 2014-03-20 DIAGNOSIS — R634 Abnormal weight loss: Secondary | ICD-10-CM | POA: Diagnosis not present

## 2014-03-20 DIAGNOSIS — R143 Flatulence: Secondary | ICD-10-CM | POA: Diagnosis not present

## 2014-04-11 DIAGNOSIS — K121 Other forms of stomatitis: Secondary | ICD-10-CM | POA: Diagnosis not present

## 2014-04-11 DIAGNOSIS — H6121 Impacted cerumen, right ear: Secondary | ICD-10-CM | POA: Diagnosis not present

## 2014-04-11 DIAGNOSIS — F17200 Nicotine dependence, unspecified, uncomplicated: Secondary | ICD-10-CM | POA: Diagnosis not present

## 2014-04-11 DIAGNOSIS — K116 Mucocele of salivary gland: Secondary | ICD-10-CM | POA: Diagnosis not present

## 2014-04-17 DIAGNOSIS — M546 Pain in thoracic spine: Secondary | ICD-10-CM | POA: Diagnosis not present

## 2014-04-17 DIAGNOSIS — M549 Dorsalgia, unspecified: Secondary | ICD-10-CM | POA: Diagnosis not present

## 2014-05-03 DIAGNOSIS — R1032 Left lower quadrant pain: Secondary | ICD-10-CM | POA: Diagnosis not present

## 2014-05-03 DIAGNOSIS — M549 Dorsalgia, unspecified: Secondary | ICD-10-CM | POA: Diagnosis not present

## 2014-05-03 DIAGNOSIS — R3989 Other symptoms and signs involving the genitourinary system: Secondary | ICD-10-CM | POA: Diagnosis not present

## 2014-05-16 DIAGNOSIS — K121 Other forms of stomatitis: Secondary | ICD-10-CM | POA: Diagnosis not present

## 2014-05-16 DIAGNOSIS — H6122 Impacted cerumen, left ear: Secondary | ICD-10-CM | POA: Diagnosis not present

## 2014-05-16 DIAGNOSIS — F17200 Nicotine dependence, unspecified, uncomplicated: Secondary | ICD-10-CM | POA: Diagnosis not present

## 2014-05-22 DIAGNOSIS — M5124 Other intervertebral disc displacement, thoracic region: Secondary | ICD-10-CM | POA: Diagnosis not present

## 2014-05-22 DIAGNOSIS — M47814 Spondylosis without myelopathy or radiculopathy, thoracic region: Secondary | ICD-10-CM | POA: Diagnosis not present

## 2014-05-22 DIAGNOSIS — M5134 Other intervertebral disc degeneration, thoracic region: Secondary | ICD-10-CM | POA: Diagnosis not present

## 2014-05-28 DIAGNOSIS — R7309 Other abnormal glucose: Secondary | ICD-10-CM | POA: Diagnosis not present

## 2014-05-28 DIAGNOSIS — Z23 Encounter for immunization: Secondary | ICD-10-CM | POA: Diagnosis not present

## 2014-05-28 DIAGNOSIS — M461 Sacroiliitis, not elsewhere classified: Secondary | ICD-10-CM | POA: Diagnosis not present

## 2014-05-28 DIAGNOSIS — E780 Pure hypercholesterolemia: Secondary | ICD-10-CM | POA: Diagnosis not present

## 2014-06-05 DIAGNOSIS — R1310 Dysphagia, unspecified: Secondary | ICD-10-CM | POA: Diagnosis not present

## 2014-06-05 DIAGNOSIS — K219 Gastro-esophageal reflux disease without esophagitis: Secondary | ICD-10-CM | POA: Diagnosis not present

## 2014-06-07 DIAGNOSIS — K219 Gastro-esophageal reflux disease without esophagitis: Secondary | ICD-10-CM | POA: Diagnosis not present

## 2014-06-07 DIAGNOSIS — Z8619 Personal history of other infectious and parasitic diseases: Secondary | ICD-10-CM | POA: Diagnosis not present

## 2014-06-07 DIAGNOSIS — K209 Esophagitis, unspecified: Secondary | ICD-10-CM | POA: Diagnosis not present

## 2014-06-07 DIAGNOSIS — R1310 Dysphagia, unspecified: Secondary | ICD-10-CM | POA: Diagnosis not present

## 2014-06-07 DIAGNOSIS — K222 Esophageal obstruction: Secondary | ICD-10-CM | POA: Diagnosis not present

## 2014-06-07 DIAGNOSIS — Z79899 Other long term (current) drug therapy: Secondary | ICD-10-CM | POA: Diagnosis not present

## 2014-06-07 DIAGNOSIS — K589 Irritable bowel syndrome without diarrhea: Secondary | ICD-10-CM | POA: Diagnosis not present

## 2014-06-07 DIAGNOSIS — Z8719 Personal history of other diseases of the digestive system: Secondary | ICD-10-CM | POA: Diagnosis not present

## 2014-06-07 DIAGNOSIS — K449 Diaphragmatic hernia without obstruction or gangrene: Secondary | ICD-10-CM | POA: Diagnosis not present

## 2014-06-07 DIAGNOSIS — F17210 Nicotine dependence, cigarettes, uncomplicated: Secondary | ICD-10-CM | POA: Diagnosis not present

## 2014-06-08 DIAGNOSIS — M5134 Other intervertebral disc degeneration, thoracic region: Secondary | ICD-10-CM | POA: Diagnosis not present

## 2014-06-08 DIAGNOSIS — R1032 Left lower quadrant pain: Secondary | ICD-10-CM | POA: Diagnosis not present

## 2014-06-08 DIAGNOSIS — M47814 Spondylosis without myelopathy or radiculopathy, thoracic region: Secondary | ICD-10-CM | POA: Diagnosis not present

## 2014-06-08 DIAGNOSIS — M2578 Osteophyte, vertebrae: Secondary | ICD-10-CM | POA: Diagnosis not present

## 2014-06-13 DIAGNOSIS — H4011X2 Primary open-angle glaucoma, moderate stage: Secondary | ICD-10-CM | POA: Diagnosis not present

## 2014-06-15 DIAGNOSIS — H5710 Ocular pain, unspecified eye: Secondary | ICD-10-CM | POA: Diagnosis not present

## 2014-10-12 DIAGNOSIS — Z6823 Body mass index (BMI) 23.0-23.9, adult: Secondary | ICD-10-CM | POA: Diagnosis not present

## 2014-10-12 DIAGNOSIS — L255 Unspecified contact dermatitis due to plants, except food: Secondary | ICD-10-CM | POA: Diagnosis not present

## 2014-11-06 DIAGNOSIS — H4011X2 Primary open-angle glaucoma, moderate stage: Secondary | ICD-10-CM | POA: Diagnosis not present

## 2014-12-26 DIAGNOSIS — R05 Cough: Secondary | ICD-10-CM | POA: Diagnosis not present

## 2014-12-26 DIAGNOSIS — Z1231 Encounter for screening mammogram for malignant neoplasm of breast: Secondary | ICD-10-CM | POA: Diagnosis not present

## 2014-12-26 DIAGNOSIS — R7309 Other abnormal glucose: Secondary | ICD-10-CM | POA: Diagnosis not present

## 2014-12-26 DIAGNOSIS — E780 Pure hypercholesterolemia: Secondary | ICD-10-CM | POA: Diagnosis not present

## 2014-12-26 DIAGNOSIS — Z1389 Encounter for screening for other disorder: Secondary | ICD-10-CM | POA: Diagnosis not present

## 2014-12-26 DIAGNOSIS — M549 Dorsalgia, unspecified: Secondary | ICD-10-CM | POA: Diagnosis not present

## 2014-12-26 DIAGNOSIS — Z9181 History of falling: Secondary | ICD-10-CM | POA: Diagnosis not present

## 2014-12-26 DIAGNOSIS — Z6823 Body mass index (BMI) 23.0-23.9, adult: Secondary | ICD-10-CM | POA: Diagnosis not present

## 2014-12-27 DIAGNOSIS — I700 Atherosclerosis of aorta: Secondary | ICD-10-CM | POA: Diagnosis not present

## 2014-12-27 DIAGNOSIS — R079 Chest pain, unspecified: Secondary | ICD-10-CM | POA: Diagnosis not present

## 2014-12-27 DIAGNOSIS — J439 Emphysema, unspecified: Secondary | ICD-10-CM | POA: Diagnosis not present

## 2014-12-27 DIAGNOSIS — R05 Cough: Secondary | ICD-10-CM | POA: Diagnosis not present

## 2015-01-02 DIAGNOSIS — Z780 Asymptomatic menopausal state: Secondary | ICD-10-CM | POA: Diagnosis not present

## 2015-01-02 DIAGNOSIS — Z1231 Encounter for screening mammogram for malignant neoplasm of breast: Secondary | ICD-10-CM | POA: Diagnosis not present

## 2015-01-02 DIAGNOSIS — M810 Age-related osteoporosis without current pathological fracture: Secondary | ICD-10-CM | POA: Diagnosis not present

## 2015-01-03 DIAGNOSIS — R05 Cough: Secondary | ICD-10-CM | POA: Diagnosis not present

## 2015-01-03 DIAGNOSIS — E278 Other specified disorders of adrenal gland: Secondary | ICD-10-CM | POA: Diagnosis not present

## 2015-01-03 DIAGNOSIS — R911 Solitary pulmonary nodule: Secondary | ICD-10-CM | POA: Diagnosis not present

## 2015-01-03 DIAGNOSIS — J432 Centrilobular emphysema: Secondary | ICD-10-CM | POA: Diagnosis not present

## 2015-01-03 DIAGNOSIS — R0602 Shortness of breath: Secondary | ICD-10-CM | POA: Diagnosis not present

## 2015-01-03 DIAGNOSIS — Z87891 Personal history of nicotine dependence: Secondary | ICD-10-CM | POA: Diagnosis not present

## 2015-01-03 DIAGNOSIS — R918 Other nonspecific abnormal finding of lung field: Secondary | ICD-10-CM | POA: Diagnosis not present

## 2015-01-08 DIAGNOSIS — H4011X2 Primary open-angle glaucoma, moderate stage: Secondary | ICD-10-CM | POA: Diagnosis not present

## 2015-01-10 DIAGNOSIS — M810 Age-related osteoporosis without current pathological fracture: Secondary | ICD-10-CM | POA: Diagnosis not present

## 2015-01-10 DIAGNOSIS — Z6824 Body mass index (BMI) 24.0-24.9, adult: Secondary | ICD-10-CM | POA: Diagnosis not present

## 2015-01-10 DIAGNOSIS — R918 Other nonspecific abnormal finding of lung field: Secondary | ICD-10-CM | POA: Diagnosis not present

## 2015-01-10 DIAGNOSIS — R1013 Epigastric pain: Secondary | ICD-10-CM | POA: Diagnosis not present

## 2015-01-10 DIAGNOSIS — M549 Dorsalgia, unspecified: Secondary | ICD-10-CM | POA: Diagnosis not present

## 2015-01-10 HISTORY — PX: LUNG REMOVAL, PARTIAL: SHX233

## 2015-01-11 DIAGNOSIS — F17200 Nicotine dependence, unspecified, uncomplicated: Secondary | ICD-10-CM | POA: Diagnosis not present

## 2015-01-11 DIAGNOSIS — R918 Other nonspecific abnormal finding of lung field: Secondary | ICD-10-CM | POA: Diagnosis not present

## 2015-01-11 DIAGNOSIS — Z720 Tobacco use: Secondary | ICD-10-CM | POA: Diagnosis not present

## 2015-01-15 DIAGNOSIS — Z0183 Encounter for blood typing: Secondary | ICD-10-CM | POA: Diagnosis not present

## 2015-01-15 DIAGNOSIS — Z01812 Encounter for preprocedural laboratory examination: Secondary | ICD-10-CM | POA: Diagnosis not present

## 2015-01-15 DIAGNOSIS — R918 Other nonspecific abnormal finding of lung field: Secondary | ICD-10-CM | POA: Diagnosis not present

## 2015-01-16 DIAGNOSIS — R918 Other nonspecific abnormal finding of lung field: Secondary | ICD-10-CM | POA: Insufficient documentation

## 2015-01-17 ENCOUNTER — Encounter: Payer: Medicare Other | Admitting: Cardiothoracic Surgery

## 2015-01-17 DIAGNOSIS — K449 Diaphragmatic hernia without obstruction or gangrene: Secondary | ICD-10-CM | POA: Diagnosis not present

## 2015-01-17 DIAGNOSIS — K219 Gastro-esophageal reflux disease without esophagitis: Secondary | ICD-10-CM | POA: Diagnosis not present

## 2015-01-17 DIAGNOSIS — R1013 Epigastric pain: Secondary | ICD-10-CM | POA: Diagnosis not present

## 2015-01-22 DIAGNOSIS — R1310 Dysphagia, unspecified: Secondary | ICD-10-CM | POA: Diagnosis not present

## 2015-01-25 DIAGNOSIS — K2900 Acute gastritis without bleeding: Secondary | ICD-10-CM | POA: Diagnosis not present

## 2015-01-25 DIAGNOSIS — R1310 Dysphagia, unspecified: Secondary | ICD-10-CM | POA: Diagnosis not present

## 2015-01-25 DIAGNOSIS — K219 Gastro-esophageal reflux disease without esophagitis: Secondary | ICD-10-CM | POA: Diagnosis not present

## 2015-01-25 DIAGNOSIS — K449 Diaphragmatic hernia without obstruction or gangrene: Secondary | ICD-10-CM | POA: Diagnosis not present

## 2015-01-25 DIAGNOSIS — K2970 Gastritis, unspecified, without bleeding: Secondary | ICD-10-CM | POA: Diagnosis not present

## 2015-01-25 DIAGNOSIS — K228 Other specified diseases of esophagus: Secondary | ICD-10-CM | POA: Diagnosis not present

## 2015-02-01 DIAGNOSIS — F17210 Nicotine dependence, cigarettes, uncomplicated: Secondary | ICD-10-CM | POA: Diagnosis present

## 2015-02-01 DIAGNOSIS — J9811 Atelectasis: Secondary | ICD-10-CM | POA: Diagnosis not present

## 2015-02-01 DIAGNOSIS — J90 Pleural effusion, not elsewhere classified: Secondary | ICD-10-CM | POA: Diagnosis not present

## 2015-02-01 DIAGNOSIS — Z9889 Other specified postprocedural states: Secondary | ICD-10-CM | POA: Diagnosis not present

## 2015-02-01 DIAGNOSIS — Z79899 Other long term (current) drug therapy: Secondary | ICD-10-CM | POA: Diagnosis not present

## 2015-02-01 DIAGNOSIS — J939 Pneumothorax, unspecified: Secondary | ICD-10-CM | POA: Diagnosis not present

## 2015-02-01 DIAGNOSIS — C3491 Malignant neoplasm of unspecified part of right bronchus or lung: Secondary | ICD-10-CM | POA: Diagnosis not present

## 2015-02-01 DIAGNOSIS — Z4682 Encounter for fitting and adjustment of non-vascular catheter: Secondary | ICD-10-CM | POA: Diagnosis not present

## 2015-02-01 DIAGNOSIS — M549 Dorsalgia, unspecified: Secondary | ICD-10-CM | POA: Diagnosis not present

## 2015-02-01 DIAGNOSIS — C3411 Malignant neoplasm of upper lobe, right bronchus or lung: Secondary | ICD-10-CM | POA: Diagnosis not present

## 2015-02-01 DIAGNOSIS — R000 Tachycardia, unspecified: Secondary | ICD-10-CM | POA: Diagnosis not present

## 2015-02-01 DIAGNOSIS — J984 Other disorders of lung: Secondary | ICD-10-CM | POA: Diagnosis not present

## 2015-02-01 DIAGNOSIS — K219 Gastro-esophageal reflux disease without esophagitis: Secondary | ICD-10-CM | POA: Diagnosis present

## 2015-02-01 DIAGNOSIS — J449 Chronic obstructive pulmonary disease, unspecified: Secondary | ICD-10-CM | POA: Diagnosis present

## 2015-02-01 DIAGNOSIS — R918 Other nonspecific abnormal finding of lung field: Secondary | ICD-10-CM | POA: Diagnosis not present

## 2015-02-01 DIAGNOSIS — J9383 Other pneumothorax: Secondary | ICD-10-CM | POA: Diagnosis not present

## 2015-02-01 DIAGNOSIS — I10 Essential (primary) hypertension: Secondary | ICD-10-CM | POA: Diagnosis present

## 2015-02-13 DIAGNOSIS — T814XXA Infection following a procedure, initial encounter: Secondary | ICD-10-CM | POA: Diagnosis not present

## 2015-02-13 DIAGNOSIS — Z6823 Body mass index (BMI) 23.0-23.9, adult: Secondary | ICD-10-CM | POA: Diagnosis not present

## 2015-02-19 DIAGNOSIS — C3411 Malignant neoplasm of upper lobe, right bronchus or lung: Secondary | ICD-10-CM | POA: Diagnosis not present

## 2015-02-19 DIAGNOSIS — Z9889 Other specified postprocedural states: Secondary | ICD-10-CM | POA: Diagnosis not present

## 2015-02-19 DIAGNOSIS — J984 Other disorders of lung: Secondary | ICD-10-CM | POA: Diagnosis not present

## 2015-02-19 DIAGNOSIS — Z09 Encounter for follow-up examination after completed treatment for conditions other than malignant neoplasm: Secondary | ICD-10-CM | POA: Diagnosis not present

## 2015-02-19 DIAGNOSIS — Z902 Acquired absence of lung [part of]: Secondary | ICD-10-CM | POA: Diagnosis not present

## 2015-02-19 DIAGNOSIS — Z01818 Encounter for other preprocedural examination: Secondary | ICD-10-CM | POA: Diagnosis not present

## 2015-03-01 DIAGNOSIS — Z79899 Other long term (current) drug therapy: Secondary | ICD-10-CM | POA: Diagnosis not present

## 2015-03-01 DIAGNOSIS — Z4802 Encounter for removal of sutures: Secondary | ICD-10-CM | POA: Diagnosis not present

## 2015-03-01 DIAGNOSIS — T8130XA Disruption of wound, unspecified, initial encounter: Secondary | ICD-10-CM | POA: Diagnosis not present

## 2015-03-01 DIAGNOSIS — Z6823 Body mass index (BMI) 23.0-23.9, adult: Secondary | ICD-10-CM | POA: Diagnosis not present

## 2015-03-26 DIAGNOSIS — Z6823 Body mass index (BMI) 23.0-23.9, adult: Secondary | ICD-10-CM | POA: Diagnosis not present

## 2015-03-26 DIAGNOSIS — R0609 Other forms of dyspnea: Secondary | ICD-10-CM | POA: Diagnosis not present

## 2015-03-26 DIAGNOSIS — J449 Chronic obstructive pulmonary disease, unspecified: Secondary | ICD-10-CM | POA: Diagnosis not present

## 2015-03-26 DIAGNOSIS — K5900 Constipation, unspecified: Secondary | ICD-10-CM | POA: Diagnosis not present

## 2015-03-27 DIAGNOSIS — R938 Abnormal findings on diagnostic imaging of other specified body structures: Secondary | ICD-10-CM | POA: Diagnosis not present

## 2015-03-27 DIAGNOSIS — R0600 Dyspnea, unspecified: Secondary | ICD-10-CM | POA: Diagnosis not present

## 2015-03-27 DIAGNOSIS — J984 Other disorders of lung: Secondary | ICD-10-CM | POA: Diagnosis not present

## 2015-03-27 DIAGNOSIS — Z85118 Personal history of other malignant neoplasm of bronchus and lung: Secondary | ICD-10-CM | POA: Diagnosis not present

## 2015-05-15 DIAGNOSIS — H401132 Primary open-angle glaucoma, bilateral, moderate stage: Secondary | ICD-10-CM | POA: Diagnosis not present

## 2015-05-23 DIAGNOSIS — C3411 Malignant neoplasm of upper lobe, right bronchus or lung: Secondary | ICD-10-CM | POA: Diagnosis not present

## 2015-05-23 DIAGNOSIS — J439 Emphysema, unspecified: Secondary | ICD-10-CM | POA: Diagnosis not present

## 2015-05-23 DIAGNOSIS — Z85118 Personal history of other malignant neoplasm of bronchus and lung: Secondary | ICD-10-CM | POA: Diagnosis not present

## 2015-07-03 DIAGNOSIS — R7303 Prediabetes: Secondary | ICD-10-CM | POA: Diagnosis not present

## 2015-07-03 DIAGNOSIS — E7800 Pure hypercholesterolemia, unspecified: Secondary | ICD-10-CM | POA: Diagnosis not present

## 2015-07-03 DIAGNOSIS — M810 Age-related osteoporosis without current pathological fracture: Secondary | ICD-10-CM | POA: Diagnosis not present

## 2015-07-03 DIAGNOSIS — M549 Dorsalgia, unspecified: Secondary | ICD-10-CM | POA: Diagnosis not present

## 2015-07-03 DIAGNOSIS — J449 Chronic obstructive pulmonary disease, unspecified: Secondary | ICD-10-CM | POA: Diagnosis not present

## 2015-07-03 DIAGNOSIS — Z23 Encounter for immunization: Secondary | ICD-10-CM | POA: Diagnosis not present

## 2015-07-03 DIAGNOSIS — Z6824 Body mass index (BMI) 24.0-24.9, adult: Secondary | ICD-10-CM | POA: Diagnosis not present

## 2015-07-03 DIAGNOSIS — B370 Candidal stomatitis: Secondary | ICD-10-CM | POA: Diagnosis not present

## 2015-09-02 DIAGNOSIS — Z902 Acquired absence of lung [part of]: Secondary | ICD-10-CM | POA: Diagnosis not present

## 2015-09-02 DIAGNOSIS — Z85118 Personal history of other malignant neoplasm of bronchus and lung: Secondary | ICD-10-CM | POA: Diagnosis not present

## 2015-09-02 DIAGNOSIS — R911 Solitary pulmonary nodule: Secondary | ICD-10-CM | POA: Diagnosis not present

## 2015-09-02 DIAGNOSIS — I700 Atherosclerosis of aorta: Secondary | ICD-10-CM | POA: Diagnosis not present

## 2015-11-01 DIAGNOSIS — L02811 Cutaneous abscess of head [any part, except face]: Secondary | ICD-10-CM | POA: Diagnosis not present

## 2015-11-01 DIAGNOSIS — S0006XA Insect bite (nonvenomous) of scalp, initial encounter: Secondary | ICD-10-CM | POA: Diagnosis not present

## 2015-11-01 DIAGNOSIS — Z6825 Body mass index (BMI) 25.0-25.9, adult: Secondary | ICD-10-CM | POA: Diagnosis not present

## 2015-12-07 DIAGNOSIS — K219 Gastro-esophageal reflux disease without esophagitis: Secondary | ICD-10-CM | POA: Diagnosis not present

## 2015-12-07 DIAGNOSIS — L02214 Cutaneous abscess of groin: Secondary | ICD-10-CM | POA: Diagnosis not present

## 2015-12-07 DIAGNOSIS — Z5329 Procedure and treatment not carried out because of patient's decision for other reasons: Secondary | ICD-10-CM | POA: Diagnosis not present

## 2015-12-07 DIAGNOSIS — Z87891 Personal history of nicotine dependence: Secondary | ICD-10-CM | POA: Diagnosis not present

## 2015-12-07 DIAGNOSIS — I2510 Atherosclerotic heart disease of native coronary artery without angina pectoris: Secondary | ICD-10-CM | POA: Diagnosis not present

## 2015-12-07 DIAGNOSIS — J449 Chronic obstructive pulmonary disease, unspecified: Secondary | ICD-10-CM | POA: Diagnosis not present

## 2015-12-19 DIAGNOSIS — C3490 Malignant neoplasm of unspecified part of unspecified bronchus or lung: Secondary | ICD-10-CM | POA: Diagnosis not present

## 2015-12-19 DIAGNOSIS — E7800 Pure hypercholesterolemia, unspecified: Secondary | ICD-10-CM | POA: Diagnosis not present

## 2015-12-19 DIAGNOSIS — K219 Gastro-esophageal reflux disease without esophagitis: Secondary | ICD-10-CM | POA: Diagnosis not present

## 2015-12-19 DIAGNOSIS — R7303 Prediabetes: Secondary | ICD-10-CM | POA: Diagnosis not present

## 2015-12-19 DIAGNOSIS — M549 Dorsalgia, unspecified: Secondary | ICD-10-CM | POA: Diagnosis not present

## 2015-12-19 DIAGNOSIS — E559 Vitamin D deficiency, unspecified: Secondary | ICD-10-CM | POA: Diagnosis not present

## 2015-12-19 DIAGNOSIS — Z6824 Body mass index (BMI) 24.0-24.9, adult: Secondary | ICD-10-CM | POA: Diagnosis not present

## 2015-12-23 DIAGNOSIS — J439 Emphysema, unspecified: Secondary | ICD-10-CM | POA: Diagnosis not present

## 2015-12-23 DIAGNOSIS — R911 Solitary pulmonary nodule: Secondary | ICD-10-CM | POA: Diagnosis not present

## 2015-12-27 ENCOUNTER — Telehealth: Payer: Self-pay | Admitting: Cardiology

## 2015-12-27 NOTE — Telephone Encounter (Signed)
12/27/2015 Received faxed referral from [facility] for upcoming appointment with Dr. Martin[place].  Records given to [facility].  cbr

## 2015-12-31 ENCOUNTER — Encounter: Payer: Self-pay | Admitting: Cardiology

## 2016-01-01 ENCOUNTER — Encounter (INDEPENDENT_AMBULATORY_CARE_PROVIDER_SITE_OTHER): Payer: Self-pay

## 2016-01-01 ENCOUNTER — Encounter: Payer: Self-pay | Admitting: Cardiology

## 2016-01-01 ENCOUNTER — Ambulatory Visit (INDEPENDENT_AMBULATORY_CARE_PROVIDER_SITE_OTHER): Payer: Medicare Other | Admitting: Cardiology

## 2016-01-01 DIAGNOSIS — E7800 Pure hypercholesterolemia, unspecified: Secondary | ICD-10-CM | POA: Diagnosis not present

## 2016-01-01 DIAGNOSIS — R0789 Other chest pain: Secondary | ICD-10-CM | POA: Diagnosis not present

## 2016-01-01 DIAGNOSIS — I2510 Atherosclerotic heart disease of native coronary artery without angina pectoris: Secondary | ICD-10-CM

## 2016-01-01 DIAGNOSIS — Z720 Tobacco use: Secondary | ICD-10-CM | POA: Diagnosis not present

## 2016-01-01 HISTORY — DX: Atherosclerotic heart disease of native coronary artery without angina pectoris: I25.10

## 2016-01-01 NOTE — Addendum Note (Signed)
Addended by: Kathyrn Lass on: 01/01/2016 01:50 PM   Modules accepted: Orders

## 2016-01-01 NOTE — Patient Instructions (Signed)
We will schedule you for a nuclear stress test.  Continue your current therapy  No smoking

## 2016-01-01 NOTE — Progress Notes (Signed)
Cardiology Office Note    Date:  01/01/2016   ID:  Ronnette Rump, DOB 12-Jan-1946, MRN [id number]  PCP:  Leonides Sake, MD  Cardiologist:  Peter Martinique, MD    History of Present Illness:  Bailey Nguyen is a 70 y.o. female seen at the request of Dr. Lisbeth Ply for evaluation of atypical chest pain and abnormal coronary calcification on CT. She has a history of hypercholesterolemia, tobacco abuse and family history of premature CAD. She has a history of adenocarcinoma of the lung and is s/p right upper lobectomy in September. At that time she was noted on CT to have coronary calcifications. She reports she quit smoking for 5 months post op but now smokes an occasional cigarette. She does complain of intermittent pain in her left axilla and under her left breast. This is not exertional. She does note some SOB with exertion. She reports a remote stress test in the past. Also reports a remote splenic infarct and had an Echo done at that time. She is able to do her own housework but is fairly sedentary.    Past Medical History:  Diagnosis Date  . Anxiety   . Back ache   . Chest wall pain   . Chronic constipation   . COPD (chronic obstructive pulmonary disease) ([place])   . Coronary artery calcification seen on CT scan 01/01/2016  . Diverticula of intestine    sigmond  . Dyskinesia of esophagus   . GERD (gastroesophageal reflux disease)   . Glaucoma   . Hx of colonic polyps   . Hx of Lyme disease   . Hx of TIA (transient ischemic attack) and stroke   . Hypercholesteremia   . Neck pain   . Osteoporosis   . Prediabetes   . Splenic infarct   . Tobacco use disorder     Past Surgical History:  Procedure Laterality Date  . BACK SURGERY    . CHOLECYSTECTOMY    . LUNG REMOVAL, PARTIAL Right 01/2015  . TUBAL LIGATION      Current Medications: No outpatient prescriptions prior to visit.   No facility-administered medications prior to visit.      Allergies:   Review of patient's  allergies indicates no known allergies.   Social History   Social History  . Marital status: Married    Spouse name: N/A  . Number of children: 5  . Years of education: N/A   Social History Main Topics  . Smoking status: Former Smoker    Packs/day: 1.00    Types: Cigarettes    Quit date: 02/01/2015  . Smokeless tobacco: Never Used  . Alcohol use No  . Drug use: No  . Sexual activity: Not Asked   Other Topics Concern  . None   Social History Narrative  . None     Family History:  The patient's family history includes Cancer in her father; Coronary artery disease (age of onset: 30) in her mother; Diabetes in her brother, father, and sister; Lung cancer in her brother.   ROS:   Please see the history of present illness.    ROS All other systems reviewed and are negative.   PHYSICAL EXAM:   VS:  BP 110/62 (BP Location: Right Arm) Comment: 104/64 Left Arm  Pulse (!) 101   Ht '5\' 6"'$  (1.676 m)   Wt 152 lb 9.6 oz (69.2 kg)   BMI 24.63 kg/m    GEN: Well nourished, well developed, in no acute distress  HEENT: normal  Neck: no JVD, carotid bruits, or masses Cardiac: RRR; no murmurs, rubs, or gallops,no edema  Respiratory:  clear to auscultation bilaterally, normal work of breathing GI: soft, nontender, nondistended, + BS MS: no deformity or atrophy  Skin: warm and dry, no rash Neuro:  Alert and Oriented x 3, Strength and sensation are intact Psych: euthymic mood, full affect  Wt Readings from Last 3 Encounters:  01/01/16 152 lb 9.6 oz (69.2 kg)      Studies/Labs Reviewed:   EKG:  EKG is ordered today.  The ekg ordered today demonstrates NSR rate 101. Normal Ecg. I have personally reviewed and interpreted this study.   Recent Labs: No results found for requested labs within last 8760 hours.   Lipid Panel No results found for: CHOL, TRIG, HDL, CHOLHDL, VLDL, LDLCALC, LDLDIRECT  Additional studies/ records that were reviewed today include:  Office records from  primary care. Lipid panel showed cholesterol- 114, triglycerides 76, HDL 52, LDL 47. Multiple CT scans reviewed.     ASSESSMENT:    1. Coronary artery calcification seen on CT scan   2. Hypercholesterolemia   3. Tobacco abuse   4. Atypical chest pain      PLAN:  In order of problems listed above:  1. Given her atypical chest pain, multiple risk factors, and coronary calcification noted on CT I would recommend a Lexiscan myoview study. If normal I would continue risk factor modification with statin and complete smoking cessation. If abnormal would need to consider coronary angiography.    Medication Adjustments/Labs and Tests Ordered: Current medicines are reviewed at length with the patient today.  Concerns regarding medicines are outlined above.  Medication changes, Labs and Tests ordered today are listed in the Patient Instructions below. Patient Instructions  We will schedule you for a nuclear stress test.  Continue your current therapy  No smoking     Signed, Peter Martinique, MD  01/01/2016 1:09 PM    [facility] [address], [place], [place], [address] [phone]

## 2016-01-10 ENCOUNTER — Inpatient Hospital Stay (HOSPITAL_COMMUNITY): Admission: RE | Admit: 2016-01-10 | Payer: Medicare Other | Source: Ambulatory Visit

## 2016-01-15 ENCOUNTER — Telehealth (HOSPITAL_COMMUNITY): Payer: Self-pay

## 2016-01-15 NOTE — Telephone Encounter (Signed)
Encounter complete. 

## 2016-01-17 ENCOUNTER — Ambulatory Visit (HOSPITAL_COMMUNITY)
Admission: RE | Admit: 2016-01-17 | Discharge: 2016-01-17 | Disposition: A | Discharge status: Home / Self Care | Payer: Medicare Other | Source: Ambulatory Visit | Attending: Cardiology | Admitting: Cardiology

## 2016-01-17 DIAGNOSIS — R0789 Other chest pain: Secondary | ICD-10-CM

## 2016-01-17 DIAGNOSIS — Z720 Tobacco use: Secondary | ICD-10-CM

## 2016-01-17 DIAGNOSIS — J449 Chronic obstructive pulmonary disease, unspecified: Secondary | ICD-10-CM | POA: Insufficient documentation

## 2016-01-17 DIAGNOSIS — R5383 Other fatigue: Secondary | ICD-10-CM | POA: Diagnosis not present

## 2016-01-17 DIAGNOSIS — R0609 Other forms of dyspnea: Secondary | ICD-10-CM | POA: Diagnosis not present

## 2016-01-17 DIAGNOSIS — E7800 Pure hypercholesterolemia, unspecified: Secondary | ICD-10-CM | POA: Diagnosis not present

## 2016-01-17 DIAGNOSIS — Z8249 Family history of ischemic heart disease and other diseases of the circulatory system: Secondary | ICD-10-CM | POA: Insufficient documentation

## 2016-01-17 DIAGNOSIS — R0602 Shortness of breath: Secondary | ICD-10-CM | POA: Insufficient documentation

## 2016-01-17 DIAGNOSIS — I2510 Atherosclerotic heart disease of native coronary artery without angina pectoris: Secondary | ICD-10-CM

## 2016-01-17 LAB — MYOCARDIAL PERFUSION IMAGING
CHL CUP NUCLEAR SDS: 4
CHL CUP STRESS STAGE 1 DBP: 67 mmHg
CHL CUP STRESS STAGE 1 GRADE: 0 %
CHL CUP STRESS STAGE 1 HR: 71 {beats}/min
CHL CUP STRESS STAGE 1 SBP: 114 mmHg
CHL CUP STRESS STAGE 1 SPEED: 0 mph
CHL CUP STRESS STAGE 2 GRADE: 0 %
CHL CUP STRESS STAGE 2 HR: 71 {beats}/min
CHL CUP STRESS STAGE 3 DBP: 63 mmHg
CHL CUP STRESS STAGE 3 GRADE: 0 %
CHL CUP STRESS STAGE 3 HR: 95 {beats}/min
CHL CUP STRESS STAGE 3 SBP: 132 mmHg
CHL CUP STRESS STAGE 3 SPEED: 0 mph
CHL CUP STRESS STAGE 4 GRADE: 0 %
CHL CUP STRESS STAGE 4 HR: 77 {beats}/min
CHL CUP STRESS STAGE 4 SBP: 108 mmHg
CSEPEW: 1 METS
CSEPPMHR: 62 %
LV dias vol: 50 mL (ref 46–106)
LV sys vol: 14 mL
Peak BP: 132 mmHg
Peak HR: 95 {beats}/min
Rest HR: 68 {beats}/min
SRS: 1
SSS: 5
Stage 2 Speed: 0 mph
Stage 4 DBP: 63 mmHg
Stage 4 Speed: 0 mph
TID: 1.24

## 2016-01-17 MED ORDER — TECHNETIUM TC 99M TETROFOSMIN IV KIT
31.2000 | PACK | Freq: Once | INTRAVENOUS | Status: AC | PRN
  Administered 2016-01-17: 31.2 via INTRAVENOUS
  Filled 2016-01-17: qty 31

## 2016-01-17 MED ORDER — TECHNETIUM TC 99M TETROFOSMIN IV KIT
10.8000 | PACK | Freq: Once | INTRAVENOUS | Status: AC | PRN
  Administered 2016-01-17: 10.8 via INTRAVENOUS
  Filled 2016-01-17: qty 11

## 2016-01-17 MED ORDER — AMINOPHYLLINE 25 MG/ML IV SOLN
75.0000 mg | Freq: Once | INTRAVENOUS | Status: AC
  Administered 2016-01-17: 75 mg via INTRAVENOUS

## 2016-01-17 MED ORDER — REGADENOSON 0.4 MG/5ML IV SOLN
0.4000 mg | Freq: Once | INTRAVENOUS | Status: AC
  Administered 2016-01-17: 0.4 mg via INTRAVENOUS

## 2016-01-22 ENCOUNTER — Telehealth: Payer: Self-pay | Admitting: Cardiology

## 2016-01-22 NOTE — Telephone Encounter (Signed)
Returned call, results given - see result note documentation.

## 2016-01-22 NOTE — Telephone Encounter (Signed)
New Message  Pt call requesting to speak with RN about Myo results. Please call back to discuss

## 2016-03-17 DIAGNOSIS — C3490 Malignant neoplasm of unspecified part of unspecified bronchus or lung: Secondary | ICD-10-CM | POA: Diagnosis not present

## 2016-03-17 DIAGNOSIS — E7800 Pure hypercholesterolemia, unspecified: Secondary | ICD-10-CM | POA: Diagnosis not present

## 2016-03-17 DIAGNOSIS — E559 Vitamin D deficiency, unspecified: Secondary | ICD-10-CM | POA: Diagnosis not present

## 2016-03-17 DIAGNOSIS — R7303 Prediabetes: Secondary | ICD-10-CM | POA: Diagnosis not present

## 2016-03-17 DIAGNOSIS — E663 Overweight: Secondary | ICD-10-CM | POA: Diagnosis not present

## 2016-03-17 DIAGNOSIS — Z9181 History of falling: Secondary | ICD-10-CM | POA: Diagnosis not present

## 2016-03-17 DIAGNOSIS — Z1389 Encounter for screening for other disorder: Secondary | ICD-10-CM | POA: Diagnosis not present

## 2016-03-17 DIAGNOSIS — Z2821 Immunization not carried out because of patient refusal: Secondary | ICD-10-CM | POA: Diagnosis not present

## 2016-03-17 DIAGNOSIS — Z6825 Body mass index (BMI) 25.0-25.9, adult: Secondary | ICD-10-CM | POA: Diagnosis not present

## 2016-03-17 DIAGNOSIS — J449 Chronic obstructive pulmonary disease, unspecified: Secondary | ICD-10-CM | POA: Diagnosis not present

## 2016-03-24 DIAGNOSIS — C069 Malignant neoplasm of mouth, unspecified: Secondary | ICD-10-CM | POA: Diagnosis not present

## 2016-03-24 DIAGNOSIS — C3490 Malignant neoplasm of unspecified part of unspecified bronchus or lung: Secondary | ICD-10-CM | POA: Diagnosis not present

## 2016-05-18 DIAGNOSIS — K1120 Sialoadenitis, unspecified: Secondary | ICD-10-CM | POA: Diagnosis not present

## 2016-05-18 DIAGNOSIS — H6123 Impacted cerumen, bilateral: Secondary | ICD-10-CM | POA: Diagnosis not present

## 2016-06-05 DIAGNOSIS — R079 Chest pain, unspecified: Secondary | ICD-10-CM | POA: Diagnosis not present

## 2016-06-05 DIAGNOSIS — R509 Fever, unspecified: Secondary | ICD-10-CM | POA: Diagnosis not present

## 2016-06-05 DIAGNOSIS — I2510 Atherosclerotic heart disease of native coronary artery without angina pectoris: Secondary | ICD-10-CM | POA: Diagnosis not present

## 2016-06-05 DIAGNOSIS — Z79899 Other long term (current) drug therapy: Secondary | ICD-10-CM | POA: Diagnosis not present

## 2016-06-05 DIAGNOSIS — R0602 Shortness of breath: Secondary | ICD-10-CM | POA: Diagnosis not present

## 2016-06-05 DIAGNOSIS — R05 Cough: Secondary | ICD-10-CM | POA: Diagnosis not present

## 2016-06-05 DIAGNOSIS — K219 Gastro-esophageal reflux disease without esophagitis: Secondary | ICD-10-CM | POA: Diagnosis not present

## 2016-06-05 DIAGNOSIS — Z87891 Personal history of nicotine dependence: Secondary | ICD-10-CM | POA: Diagnosis not present

## 2016-06-05 DIAGNOSIS — J441 Chronic obstructive pulmonary disease with (acute) exacerbation: Secondary | ICD-10-CM | POA: Diagnosis not present

## 2016-06-05 DIAGNOSIS — R0982 Postnasal drip: Secondary | ICD-10-CM | POA: Diagnosis not present

## 2016-06-12 DIAGNOSIS — Z79899 Other long term (current) drug therapy: Secondary | ICD-10-CM | POA: Diagnosis not present

## 2016-06-12 DIAGNOSIS — D72829 Elevated white blood cell count, unspecified: Secondary | ICD-10-CM | POA: Diagnosis not present

## 2016-06-12 DIAGNOSIS — Z6825 Body mass index (BMI) 25.0-25.9, adult: Secondary | ICD-10-CM | POA: Diagnosis not present

## 2016-06-12 DIAGNOSIS — J441 Chronic obstructive pulmonary disease with (acute) exacerbation: Secondary | ICD-10-CM | POA: Diagnosis not present

## 2016-08-05 DIAGNOSIS — H401132 Primary open-angle glaucoma, bilateral, moderate stage: Secondary | ICD-10-CM | POA: Diagnosis not present

## 2016-08-05 DIAGNOSIS — H26499 Other secondary cataract, unspecified eye: Secondary | ICD-10-CM | POA: Diagnosis not present

## 2016-08-11 DIAGNOSIS — Z6825 Body mass index (BMI) 25.0-25.9, adult: Secondary | ICD-10-CM | POA: Diagnosis not present

## 2016-08-11 DIAGNOSIS — J449 Chronic obstructive pulmonary disease, unspecified: Secondary | ICD-10-CM | POA: Diagnosis not present

## 2016-08-11 DIAGNOSIS — K219 Gastro-esophageal reflux disease without esophagitis: Secondary | ICD-10-CM | POA: Diagnosis not present

## 2016-08-11 DIAGNOSIS — E7800 Pure hypercholesterolemia, unspecified: Secondary | ICD-10-CM | POA: Diagnosis not present

## 2016-08-11 DIAGNOSIS — M549 Dorsalgia, unspecified: Secondary | ICD-10-CM | POA: Diagnosis not present

## 2016-08-11 DIAGNOSIS — K5909 Other constipation: Secondary | ICD-10-CM | POA: Diagnosis not present

## 2016-08-11 DIAGNOSIS — E559 Vitamin D deficiency, unspecified: Secondary | ICD-10-CM | POA: Diagnosis not present

## 2016-08-11 DIAGNOSIS — L719 Rosacea, unspecified: Secondary | ICD-10-CM | POA: Diagnosis not present

## 2016-08-11 DIAGNOSIS — C3490 Malignant neoplasm of unspecified part of unspecified bronchus or lung: Secondary | ICD-10-CM | POA: Diagnosis not present

## 2016-09-10 DIAGNOSIS — J449 Chronic obstructive pulmonary disease, unspecified: Secondary | ICD-10-CM | POA: Diagnosis not present

## 2016-09-10 DIAGNOSIS — C3490 Malignant neoplasm of unspecified part of unspecified bronchus or lung: Secondary | ICD-10-CM | POA: Diagnosis not present

## 2016-10-21 DIAGNOSIS — H401132 Primary open-angle glaucoma, bilateral, moderate stage: Secondary | ICD-10-CM | POA: Diagnosis not present

## 2016-10-21 DIAGNOSIS — H16223 Keratoconjunctivitis sicca, not specified as Sjogren's, bilateral: Secondary | ICD-10-CM | POA: Diagnosis not present

## 2016-10-21 DIAGNOSIS — H26493 Other secondary cataract, bilateral: Secondary | ICD-10-CM | POA: Diagnosis not present

## 2016-11-27 DIAGNOSIS — H401132 Primary open-angle glaucoma, bilateral, moderate stage: Secondary | ICD-10-CM | POA: Diagnosis not present

## 2017-01-14 DIAGNOSIS — J441 Chronic obstructive pulmonary disease with (acute) exacerbation: Secondary | ICD-10-CM | POA: Diagnosis not present

## 2017-01-14 DIAGNOSIS — Z6825 Body mass index (BMI) 25.0-25.9, adult: Secondary | ICD-10-CM | POA: Diagnosis not present

## 2017-01-14 DIAGNOSIS — R0789 Other chest pain: Secondary | ICD-10-CM | POA: Diagnosis not present

## 2017-01-14 DIAGNOSIS — E663 Overweight: Secondary | ICD-10-CM | POA: Diagnosis not present

## 2017-01-27 DIAGNOSIS — Z1231 Encounter for screening mammogram for malignant neoplasm of breast: Secondary | ICD-10-CM | POA: Diagnosis not present

## 2017-02-23 DIAGNOSIS — Z1211 Encounter for screening for malignant neoplasm of colon: Secondary | ICD-10-CM | POA: Diagnosis not present

## 2017-02-23 DIAGNOSIS — R1310 Dysphagia, unspecified: Secondary | ICD-10-CM | POA: Diagnosis not present

## 2017-03-05 DIAGNOSIS — R1310 Dysphagia, unspecified: Secondary | ICD-10-CM | POA: Diagnosis not present

## 2017-03-05 DIAGNOSIS — K222 Esophageal obstruction: Secondary | ICD-10-CM | POA: Diagnosis not present

## 2017-03-05 DIAGNOSIS — K2900 Acute gastritis without bleeding: Secondary | ICD-10-CM | POA: Diagnosis not present

## 2017-03-05 DIAGNOSIS — K2940 Chronic atrophic gastritis without bleeding: Secondary | ICD-10-CM | POA: Diagnosis not present

## 2017-03-05 HISTORY — PX: ESOPHAGOGASTRODUODENOSCOPY: SHX1529

## 2017-03-08 DIAGNOSIS — I2510 Atherosclerotic heart disease of native coronary artery without angina pectoris: Secondary | ICD-10-CM | POA: Diagnosis not present

## 2017-03-08 DIAGNOSIS — C3411 Malignant neoplasm of upper lobe, right bronchus or lung: Secondary | ICD-10-CM | POA: Diagnosis not present

## 2017-03-08 DIAGNOSIS — C3490 Malignant neoplasm of unspecified part of unspecified bronchus or lung: Secondary | ICD-10-CM | POA: Diagnosis not present

## 2017-03-08 DIAGNOSIS — Z9889 Other specified postprocedural states: Secondary | ICD-10-CM | POA: Diagnosis not present

## 2017-03-08 DIAGNOSIS — I700 Atherosclerosis of aorta: Secondary | ICD-10-CM | POA: Diagnosis not present

## 2017-03-08 DIAGNOSIS — J439 Emphysema, unspecified: Secondary | ICD-10-CM | POA: Diagnosis not present

## 2017-03-10 DIAGNOSIS — Z2821 Immunization not carried out because of patient refusal: Secondary | ICD-10-CM | POA: Diagnosis not present

## 2017-03-10 DIAGNOSIS — E7800 Pure hypercholesterolemia, unspecified: Secondary | ICD-10-CM | POA: Diagnosis not present

## 2017-03-10 DIAGNOSIS — K219 Gastro-esophageal reflux disease without esophagitis: Secondary | ICD-10-CM | POA: Diagnosis not present

## 2017-03-10 DIAGNOSIS — E559 Vitamin D deficiency, unspecified: Secondary | ICD-10-CM | POA: Diagnosis not present

## 2017-03-10 DIAGNOSIS — J449 Chronic obstructive pulmonary disease, unspecified: Secondary | ICD-10-CM | POA: Diagnosis not present

## 2017-03-10 DIAGNOSIS — Z6825 Body mass index (BMI) 25.0-25.9, adult: Secondary | ICD-10-CM | POA: Diagnosis not present

## 2017-03-10 DIAGNOSIS — Z85118 Personal history of other malignant neoplasm of bronchus and lung: Secondary | ICD-10-CM | POA: Diagnosis not present

## 2017-03-10 DIAGNOSIS — K5909 Other constipation: Secondary | ICD-10-CM | POA: Diagnosis not present

## 2017-08-18 DIAGNOSIS — H401132 Primary open-angle glaucoma, bilateral, moderate stage: Secondary | ICD-10-CM | POA: Diagnosis not present

## 2017-08-18 DIAGNOSIS — H26493 Other secondary cataract, bilateral: Secondary | ICD-10-CM | POA: Diagnosis not present

## 2017-09-03 DIAGNOSIS — J439 Emphysema, unspecified: Secondary | ICD-10-CM | POA: Diagnosis not present

## 2017-09-03 DIAGNOSIS — Z85118 Personal history of other malignant neoplasm of bronchus and lung: Secondary | ICD-10-CM | POA: Diagnosis not present

## 2017-09-03 DIAGNOSIS — J432 Centrilobular emphysema: Secondary | ICD-10-CM | POA: Diagnosis not present

## 2017-09-03 DIAGNOSIS — I700 Atherosclerosis of aorta: Secondary | ICD-10-CM | POA: Diagnosis not present

## 2017-09-03 DIAGNOSIS — Z902 Acquired absence of lung [part of]: Secondary | ICD-10-CM | POA: Diagnosis not present

## 2017-09-07 ENCOUNTER — Encounter: Payer: Self-pay | Admitting: Gastroenterology

## 2017-09-08 DIAGNOSIS — M549 Dorsalgia, unspecified: Secondary | ICD-10-CM | POA: Diagnosis not present

## 2017-09-08 DIAGNOSIS — E559 Vitamin D deficiency, unspecified: Secondary | ICD-10-CM | POA: Diagnosis not present

## 2017-09-08 DIAGNOSIS — R7303 Prediabetes: Secondary | ICD-10-CM | POA: Diagnosis not present

## 2017-09-08 DIAGNOSIS — Z9181 History of falling: Secondary | ICD-10-CM | POA: Diagnosis not present

## 2017-09-08 DIAGNOSIS — J449 Chronic obstructive pulmonary disease, unspecified: Secondary | ICD-10-CM | POA: Diagnosis not present

## 2017-09-08 DIAGNOSIS — Z85118 Personal history of other malignant neoplasm of bronchus and lung: Secondary | ICD-10-CM | POA: Diagnosis not present

## 2017-09-08 DIAGNOSIS — Z1331 Encounter for screening for depression: Secondary | ICD-10-CM | POA: Diagnosis not present

## 2017-09-08 DIAGNOSIS — K219 Gastro-esophageal reflux disease without esophagitis: Secondary | ICD-10-CM | POA: Diagnosis not present

## 2017-09-08 DIAGNOSIS — E7800 Pure hypercholesterolemia, unspecified: Secondary | ICD-10-CM | POA: Diagnosis not present

## 2017-09-17 DIAGNOSIS — H401132 Primary open-angle glaucoma, bilateral, moderate stage: Secondary | ICD-10-CM | POA: Diagnosis not present

## 2017-09-30 DIAGNOSIS — H26493 Other secondary cataract, bilateral: Secondary | ICD-10-CM | POA: Diagnosis not present

## 2017-09-30 DIAGNOSIS — H401132 Primary open-angle glaucoma, bilateral, moderate stage: Secondary | ICD-10-CM | POA: Diagnosis not present

## 2017-10-08 ENCOUNTER — Ambulatory Visit: Payer: Medicare Other | Admitting: Gastroenterology

## 2017-10-28 DIAGNOSIS — H401132 Primary open-angle glaucoma, bilateral, moderate stage: Secondary | ICD-10-CM | POA: Diagnosis not present

## 2017-12-02 DIAGNOSIS — Z6825 Body mass index (BMI) 25.0-25.9, adult: Secondary | ICD-10-CM | POA: Diagnosis not present

## 2017-12-02 DIAGNOSIS — R1012 Left upper quadrant pain: Secondary | ICD-10-CM | POA: Diagnosis not present

## 2017-12-02 DIAGNOSIS — K449 Diaphragmatic hernia without obstruction or gangrene: Secondary | ICD-10-CM | POA: Diagnosis not present

## 2018-01-07 DIAGNOSIS — K5730 Diverticulosis of large intestine without perforation or abscess without bleeding: Secondary | ICD-10-CM | POA: Diagnosis not present

## 2018-01-07 DIAGNOSIS — R1012 Left upper quadrant pain: Secondary | ICD-10-CM | POA: Diagnosis not present

## 2018-02-10 DIAGNOSIS — I2510 Atherosclerotic heart disease of native coronary artery without angina pectoris: Secondary | ICD-10-CM | POA: Diagnosis not present

## 2018-02-10 DIAGNOSIS — R200 Anesthesia of skin: Secondary | ICD-10-CM | POA: Diagnosis not present

## 2018-02-10 DIAGNOSIS — J449 Chronic obstructive pulmonary disease, unspecified: Secondary | ICD-10-CM | POA: Diagnosis not present

## 2018-02-10 DIAGNOSIS — Z8673 Personal history of transient ischemic attack (TIA), and cerebral infarction without residual deficits: Secondary | ICD-10-CM | POA: Diagnosis not present

## 2018-02-19 DIAGNOSIS — M5412 Radiculopathy, cervical region: Secondary | ICD-10-CM | POA: Diagnosis not present

## 2018-02-19 DIAGNOSIS — M4802 Spinal stenosis, cervical region: Secondary | ICD-10-CM | POA: Diagnosis not present

## 2018-02-22 DIAGNOSIS — H401132 Primary open-angle glaucoma, bilateral, moderate stage: Secondary | ICD-10-CM | POA: Diagnosis not present

## 2018-03-05 DIAGNOSIS — J439 Emphysema, unspecified: Secondary | ICD-10-CM | POA: Diagnosis not present

## 2018-03-05 DIAGNOSIS — Z85118 Personal history of other malignant neoplasm of bronchus and lung: Secondary | ICD-10-CM | POA: Diagnosis not present

## 2018-03-14 DIAGNOSIS — E7800 Pure hypercholesterolemia, unspecified: Secondary | ICD-10-CM | POA: Diagnosis not present

## 2018-03-14 DIAGNOSIS — R911 Solitary pulmonary nodule: Secondary | ICD-10-CM | POA: Diagnosis not present

## 2018-03-14 DIAGNOSIS — Z1339 Encounter for screening examination for other mental health and behavioral disorders: Secondary | ICD-10-CM | POA: Diagnosis not present

## 2018-03-14 DIAGNOSIS — R7303 Prediabetes: Secondary | ICD-10-CM | POA: Diagnosis not present

## 2018-03-14 DIAGNOSIS — K219 Gastro-esophageal reflux disease without esophagitis: Secondary | ICD-10-CM | POA: Diagnosis not present

## 2018-03-14 DIAGNOSIS — E559 Vitamin D deficiency, unspecified: Secondary | ICD-10-CM | POA: Diagnosis not present

## 2018-03-14 DIAGNOSIS — J449 Chronic obstructive pulmonary disease, unspecified: Secondary | ICD-10-CM | POA: Diagnosis not present

## 2018-03-14 DIAGNOSIS — Z85118 Personal history of other malignant neoplasm of bronchus and lung: Secondary | ICD-10-CM | POA: Diagnosis not present

## 2018-03-22 DIAGNOSIS — C3411 Malignant neoplasm of upper lobe, right bronchus or lung: Secondary | ICD-10-CM | POA: Diagnosis not present

## 2018-03-30 ENCOUNTER — Encounter: Payer: Self-pay | Admitting: Gastroenterology

## 2018-04-04 ENCOUNTER — Ambulatory Visit (INDEPENDENT_AMBULATORY_CARE_PROVIDER_SITE_OTHER): Payer: Medicare Other | Admitting: Gastroenterology

## 2018-04-04 ENCOUNTER — Encounter: Payer: Self-pay | Admitting: Gastroenterology

## 2018-04-04 VITALS — BP 124/76 | HR 65 | Ht 65.25 in | Wt 153.2 lb

## 2018-04-04 DIAGNOSIS — K219 Gastro-esophageal reflux disease without esophagitis: Secondary | ICD-10-CM | POA: Diagnosis not present

## 2018-04-04 DIAGNOSIS — R1310 Dysphagia, unspecified: Secondary | ICD-10-CM

## 2018-04-04 MED ORDER — DEXLANSOPRAZOLE 60 MG PO CPDR: 60.0000 mg | DELAYED_RELEASE_CAPSULE | Freq: Every day | ORAL | 3 refills | Status: DC

## 2018-04-04 NOTE — Progress Notes (Addendum)
Chief Complaint: FU  Referring Provider:  Leonides Sake, MD      ASSESSMENT AND PLAN;   #1. GERD with dysphagia. No H/O XRT. Pt history of NSCLC s/p right upper lobectomy 2016  #2. Epigastric pain - neg CT abd/pelvis 12/2017.  Plan: - Ba Swallow with Ba tablet.  Wants to get it done at [facility] due to distance of travel. - Change protonix to dexilant 60mg  po qd x 2 weeks.  Then, can restart Protonix. - EGD with dil at [facility]. - I have instructed patient that she needs to chew foods especially meats and breads well and eat slowly.  Addendum  barium swallow on 04/28/2018 at [facility] hiatal hernia, reflux to upper esophagus which empties slowly, occasional tertiary contractions.  13 mm tablet passed freely.  No masses or ulcerations. HPI:    Bailey Nguyen is a 72 y.o. female  Follow-up Recurrent dysphagia mostly to solids, no problems with liquids, mid chest Status post EGDs with dilatations in 2013, 2016 and 2018 with good relief Still with heartburn despite of Protonix 40 mg p.o. once a day Has history of right upper lobectomy due to non-small cell lung cancer.  No radiation. Most recent EGD 02/2017-distal esophageal stricture status post esophageal dilatation 63 F4 Maloney, gastritis Had colonoscopy 09/2013-limited due to preparation, grossly negative except for moderate sigmoid diverticulosis and internal hemorrhoids. Had epigastric pain in August 2019, went to [facility], underwent CT scan of the abdomen pelvis which was unremarkable.  The abdominal pain has resolved. No melena or hematochezia No weight loss No fever or chills. Does not want repeat colonoscopy.  Past Medical History:  Diagnosis Date  . Anxiety   . Back ache   . Chest wall pain   . Chronic constipation   . COPD (chronic obstructive pulmonary disease) ([place])   . Coronary artery calcification seen on CT scan 01/01/2016  . Diverticula of intestine    sigmond  . Dyskinesia of  esophagus   . GERD (gastroesophageal reflux disease)   . Glaucoma   . Hx of colonic polyps   . Hx of Lyme disease   . Hx of TIA (transient ischemic attack) and stroke   . Hypercholesteremia   . Lung cancer ([place])    Removed upper right portion of lung   . Neck pain   . Osteoporosis   . Prediabetes   . Splenic infarct   . Tobacco use disorder     Past Surgical History:  Procedure Laterality Date  . BACK SURGERY    . CHOLECYSTECTOMY    . COLONOSCOPY  09/22/2013   Moderate predominantly sigmoid diverticulosis. Moderate internal hemorrhoids. Otherwise normal colonoscopy. Limited due to qualityt of prep/.  . ESOPHAGOGASTRODUODENOSCOPY  03/05/2017   Peptic esophageal stricture. Gastritis.   Marland Kitchen LUNG REMOVAL, PARTIAL Right 01/2015  . TUBAL LIGATION      Family History  Problem Relation Age of Onset  . Diabetes Sister   . Diabetes Brother   . Lung cancer Brother   . Diabetes Father   . Pancreatic cancer Father   . Coronary artery disease Mother 40  . Thyroid cancer Daughter   . Colon cancer Neg Hx   . Esophageal cancer Neg Hx     Social History   Tobacco Use  . Smoking status: Former Smoker    Packs/day: 1.00    Types: Cigarettes    Last attempt to quit: 02/01/2015    Years since quitting: 3.1  . Smokeless tobacco: Never  Used  Substance Use Topics  . Alcohol use: No    Alcohol/week: 0.0 standard drinks  . Drug use: No    Current Outpatient Medications  Medication Sig Dispense Refill  . ALBUTEROL IN Inhale into the lungs as needed.    Marland Kitchen aspirin 325 MG tablet Take by mouth as needed.    . cyclobenzaprine (FLEXERIL) 5 MG tablet Take 5 mg by mouth 3 (three) times daily as needed. (back muscle pain)  1  . ibuprofen (ADVIL,MOTRIN) 800 MG tablet Take 800 mg by mouth as needed.    . nystatin (MYCOSTATIN) 100000 UNIT/ML suspension as needed.    . pantoprazole (PROTONIX) 40 MG tablet Take 40 mg by mouth daily.    . polyethylene glycol (MIRALAX / GLYCOLAX) packet Take 17 g by  mouth daily as needed for constipation.    . rosuvastatin (CRESTOR) 10 MG tablet Take 10 mg by mouth at bedtime.    . travoprost, benzalkonium, (TRAVATAN) 0.004 % ophthalmic solution Place 1 drop into both eyes at bedtime.     No current facility-administered medications for this visit.     No Known Allergies  Review of Systems:  Negative except for HPI.     Physical Exam:    BP 124/76   Pulse 65   Ht 5' 5.25" (1.657 m)   Wt 153 lb 4 oz (69.5 kg)   BMI 25.31 kg/m  Filed Weights   04/04/18 1459  Weight: 153 lb 4 oz (69.5 kg)   Constitutional:  Well-developed, in no acute distress. Psychiatric: Normal mood and affect. Behavior is normal. HEENT: Pupils normal.  Conjunctivae are normal. No scleral icterus. Neck supple.  Cardiovascular: Normal rate, regular rhythm. No edema Pulmonary/chest: Bilateral decreased breath sounds.  No wheezing or rhonchi. Abdominal: Soft, nondistended. Nontender. Bowel sounds active throughout. There are no masses palpable. No hepatomegaly. Rectal:  defered Neurological: Alert and oriented to person place and time. Skin: Skin is warm and dry. No rashes noted.  Data Reviewed: I have personally reviewed following labs and imaging studies  CBC: CBC Latest Ref Rng & Units 07/02/2008 07/22/2007  WBC - - 6.3  Hemoglobin 12.0 - 15.0 g/dL 15.6(H) 14.5  Hematocrit 36.0 - 46.0 % 46.0 41.6  Platelets - - 277    CMP: CMP Latest Ref Rng & Units 07/02/2008 07/22/2007  Glucose 70 - 99 mg/dL 102(H) 74  BUN 6 - 23 mg/dL 17 14  Creatinine 0.4 - 1.2 mg/dL 0.6 0.85  Sodium 135 - 145 mEq/L 142 141  Potassium 3.5 - 5.1 mEq/L 3.9 3.9  Chloride 96 - 112 mEq/L 110 107  CO2 - - 26  Calcium - - 9.0  I spent 25 minutes of face-to-face time with the patient. Greater than 50% of the time was spent counseling and coordinating care.    Carmell Austria, MD 04/04/2018, 3:25 PM  Cc: Leonides Sake, MD

## 2018-04-04 NOTE — Patient Instructions (Addendum)
If you are age 70 or older, your body mass index should be between 23-30. Your Body mass index is 25.31 kg/m. If this is out of the aforementioned range listed, please consider follow up with your Primary Care Provider.  If you are age 47 or younger, your body mass index should be between 19-25. Your Body mass index is 25.31 kg/m. If this is out of the aformentioned range listed, please consider follow up with your Primary Care Provider.   Per your request we have sent your order for the Barium Swallow to [facility], you will receive a call from [facility] with your appointment for this.   You have been scheduled for an endoscopy. Please follow written instructions given to you at your visit today. If you use inhalers (even only as needed), please bring them with you on the day of your procedure. Your physician has requested that you go to www.startemmi.com and enter the access code given to you at your visit today. This web site gives a general overview about your procedure. However, you should still follow specific instructions given to you by our office regarding your preparation for the procedure.  We have given you samples of the following medication to take: Dexilant 60 mg once daily x 2 weeks  Thank you,  Dr. Jackquline Denmark

## 2018-04-14 DIAGNOSIS — H20011 Primary iridocyclitis, right eye: Secondary | ICD-10-CM | POA: Diagnosis not present

## 2018-04-14 DIAGNOSIS — H401132 Primary open-angle glaucoma, bilateral, moderate stage: Secondary | ICD-10-CM | POA: Diagnosis not present

## 2018-04-15 DIAGNOSIS — Z6825 Body mass index (BMI) 25.0-25.9, adult: Secondary | ICD-10-CM | POA: Diagnosis not present

## 2018-04-15 DIAGNOSIS — J441 Chronic obstructive pulmonary disease with (acute) exacerbation: Secondary | ICD-10-CM | POA: Diagnosis not present

## 2018-04-28 DIAGNOSIS — R1310 Dysphagia, unspecified: Secondary | ICD-10-CM | POA: Diagnosis not present

## 2018-04-28 DIAGNOSIS — K449 Diaphragmatic hernia without obstruction or gangrene: Secondary | ICD-10-CM | POA: Diagnosis not present

## 2018-04-28 DIAGNOSIS — K219 Gastro-esophageal reflux disease without esophagitis: Secondary | ICD-10-CM | POA: Diagnosis not present

## 2018-05-17 ENCOUNTER — Encounter: Payer: Self-pay | Admitting: Gastroenterology

## 2018-05-17 ENCOUNTER — Ambulatory Visit (AMBULATORY_SURGERY_CENTER): Payer: Medicare Other | Admitting: Gastroenterology

## 2018-05-17 VITALS — BP 140/66 | HR 75 | Temp 96.6°F | Resp 15 | Ht 65.0 in | Wt 153.0 lb

## 2018-05-17 DIAGNOSIS — Z8673 Personal history of transient ischemic attack (TIA), and cerebral infarction without residual deficits: Secondary | ICD-10-CM | POA: Diagnosis not present

## 2018-05-17 DIAGNOSIS — R1310 Dysphagia, unspecified: Secondary | ICD-10-CM

## 2018-05-17 DIAGNOSIS — F419 Anxiety disorder, unspecified: Secondary | ICD-10-CM | POA: Diagnosis not present

## 2018-05-17 DIAGNOSIS — K219 Gastro-esophageal reflux disease without esophagitis: Secondary | ICD-10-CM | POA: Diagnosis not present

## 2018-05-17 DIAGNOSIS — J449 Chronic obstructive pulmonary disease, unspecified: Secondary | ICD-10-CM | POA: Diagnosis not present

## 2018-05-17 DIAGNOSIS — K2970 Gastritis, unspecified, without bleeding: Secondary | ICD-10-CM | POA: Diagnosis not present

## 2018-05-17 DIAGNOSIS — E7800 Pure hypercholesterolemia, unspecified: Secondary | ICD-10-CM | POA: Diagnosis not present

## 2018-05-17 DIAGNOSIS — K2950 Unspecified chronic gastritis without bleeding: Secondary | ICD-10-CM | POA: Diagnosis not present

## 2018-05-17 MED ORDER — SODIUM CHLORIDE 0.9 % IV SOLN: 500.0000 mL | Freq: Once | INTRAVENOUS | Status: DC

## 2018-05-17 NOTE — Patient Instructions (Signed)
YOU HAD AN ENDOSCOPIC PROCEDURE TODAY AT [facility]:   Refer to the procedure report that was given to you for any specific questions about what was found during the examination.  If the procedure report does not answer your questions, please call your gastroenterologist to clarify.  If you requested that your care partner not be given the details of your procedure findings, then the procedure report has been included in a sealed envelope for you to review at your convenience later.  YOU SHOULD EXPECT: Some feelings of bloating in the abdomen. Passage of more gas than usual.  Walking can help get rid of the air that was put into your GI tract during the procedure and reduce the bloating.  Please Note:  You might notice some irritation and congestion in your nose or some drainage.  This is from the oxygen used during your procedure.  There is no need for concern and it should clear up in a day or so.  SYMPTOMS TO REPORT IMMEDIATELY:    Following upper endoscopy (EGD)  Vomiting of blood or coffee ground material  New chest pain or pain under the shoulder blades  Painful or persistently difficult swallowing  New shortness of breath  Fever of 100F or higher  Black, tarry-looking stools  For urgent or emergent issues, a gastroenterologist can be reached at any hour by calling [phone].   DIET:  We do recommend  Nothing by mouth until 12, then clear liquids until 1pm.  You may have a soft diet for the rest of today.  Then you may proceed to your regular diet tomorrow.  Drink plenty of fluids but you should avoid alcoholic beverages for 24 hours.  ACTIVITY:  You should plan to take it easy for the rest of today and you should NOT DRIVE or use heavy machinery until tomorrow (because of the sedation medicines used during the test).    FOLLOW UP: Our staff will call the number listed on your records the next business day following your procedure to check on you and address  any questions or concerns that you may have regarding the information given to you following your procedure. If we do not reach you, we will leave a message.  However, if you are feeling well and you are not experiencing any problems, there is no need to return our call.  We will assume that you have returned to your regular daily activities without incident.  If any biopsies were taken you will be contacted by phone or by letter within the next 1-3 weeks.  Please call us at [phone] if you have not heard about the biopsies in 3 weeks.    SIGNATURES/CONFIDENTIALITY: You and/or your care partner have signed paperwork which will be entered into your electronic medical record.  These signatures attest to the fact that that the information above on your After Visit Summary has been reviewed and is understood.  Full responsibility of the confidentiality of this discharge information lies with you and/or your care-partner.  Read all handouts given to you by your recovery room nurse.

## 2018-05-17 NOTE — Progress Notes (Signed)
Called to room to assist during endoscopic procedure.  Patient ID and intended procedure confirmed with present staff. Received instructions for my participation in the procedure from the performing physician.  

## 2018-05-17 NOTE — Progress Notes (Signed)
PT taken to PACU. Monitors in place. VSS. Report given to RN. 

## 2018-05-17 NOTE — Op Note (Addendum)
[facility] Patient Name: Bailey Nguyen Procedure Date: 05/17/2018 10:38 AM MRN: [id number] Endoscopist: Jackquline Denmark , MD Age: 73 Referring MD:  Date of Birth: 08-28-1945 Gender: Female Account #: [id number] Procedure:                Upper GI endoscopy Indications:              Dysphagia, GERD Medicines:                Monitored Anesthesia Care Procedure:                Pre-Anesthesia Assessment:                           - Prior to the procedure, a History and Physical                            was performed, and patient medications and                            allergies were reviewed. The patient's tolerance of                            previous anesthesia was also reviewed. The risks                            and benefits of the procedure and the sedation                            options and risks were discussed with the patient.                            All questions were answered, and informed consent                            was obtained. Prior Anticoagulants: The patient has                            taken no previous anticoagulant or antiplatelet                            agents. ASA Grade Assessment: III - A patient with                            severe systemic disease. After reviewing the risks                            and benefits, the patient was deemed in                            satisfactory condition to undergo the procedure.                           After obtaining informed consent, the endoscope was  passed under direct vision. Throughout the                            procedure, the patient's blood pressure, pulse, and                            oxygen saturations were monitored continuously. The                            Model GIF-HQ190 [id number]) scope was introduced                            through the mouth, and advanced to the second part                            of duodenum. The upper GI endoscopy  was                            accomplished without difficulty. The patient                            tolerated the procedure well. Scope In: Scope Out: Findings:                 The examined esophagus was mildly tortuous.                            Biopsies were taken with a cold forceps for                            histology to r/o EoE. The scope was withdrawn.                            Dilation was performed with a Maloney dilator with                            no resistance at 50 Fr. No hiatal hernia.                           Localized mild inflammation characterized by                            erythema was found in the gastric body and in the                            gastric antrum. Biopsies were taken with a cold                            forceps for histology.                           The examined duodenum was normal. Estimated blood  loss: none. Complications:            No immediate complications. Estimated Blood Loss:     Estimated blood loss: none. Impression:               - Presbyesophagus s/p esophageal dilatation.                           - Mild gastritis. Recommendation:           - Patient has a contact number available for                            emergencies. The signs and symptoms of potential                            delayed complications were discussed with the                            patient. Return to normal activities tomorrow.                            Written discharge instructions were provided to the                            patient.                           - Post dilatation diet.                           - Continue Protonix 40 mg p.o. once a day.                           - Chew food especially meats and breads well and                            eat slowly. Do not lie down for 3 hours after                            eating. All pills in sitting or standing position.                           - Await  pathology results.                           - Return to GI clinic in 12 weeks. If still with                            problems, would consider esophageal manometry. Jackquline Denmark, MD 05/17/2018 10:57:43 AM This report has been signed electronically.

## 2018-05-18 ENCOUNTER — Telehealth: Payer: Self-pay

## 2018-05-18 NOTE — Telephone Encounter (Signed)
  Follow up Call-  Call back number 05/17/2018  Post procedure Call Back phone  # [phone]  Permission to leave phone message Yes  Some recent data might be hidden     Patient questions:  Do you have a fever, pain , or abdominal swelling? No. Pain Score  0 *  Have you tolerated food without any problems? Yes.    Have you been able to return to your normal activities? Yes.    Do you have any questions about your discharge instructions: Diet   No. Medications  No. Follow up visit  No.  Do you have questions or concerns about your Care? No.  Actions: * If pain score is 4 or above: No action needed, pain <4.

## 2018-05-25 ENCOUNTER — Encounter: Payer: Self-pay | Admitting: Gastroenterology

## 2018-05-31 DIAGNOSIS — H401132 Primary open-angle glaucoma, bilateral, moderate stage: Secondary | ICD-10-CM | POA: Diagnosis not present

## 2018-06-20 DIAGNOSIS — J439 Emphysema, unspecified: Secondary | ICD-10-CM | POA: Diagnosis not present

## 2018-06-20 DIAGNOSIS — R911 Solitary pulmonary nodule: Secondary | ICD-10-CM | POA: Diagnosis not present

## 2018-06-20 DIAGNOSIS — C3411 Malignant neoplasm of upper lobe, right bronchus or lung: Secondary | ICD-10-CM | POA: Diagnosis not present

## 2018-06-20 DIAGNOSIS — I700 Atherosclerosis of aorta: Secondary | ICD-10-CM | POA: Diagnosis not present

## 2018-06-28 DIAGNOSIS — C3432 Malignant neoplasm of lower lobe, left bronchus or lung: Secondary | ICD-10-CM | POA: Diagnosis not present

## 2018-07-06 DIAGNOSIS — R590 Localized enlarged lymph nodes: Secondary | ICD-10-CM | POA: Diagnosis not present

## 2018-07-06 DIAGNOSIS — C3432 Malignant neoplasm of lower lobe, left bronchus or lung: Secondary | ICD-10-CM | POA: Diagnosis not present

## 2018-07-12 DIAGNOSIS — Z85118 Personal history of other malignant neoplasm of bronchus and lung: Secondary | ICD-10-CM | POA: Diagnosis not present

## 2018-07-12 DIAGNOSIS — R0600 Dyspnea, unspecified: Secondary | ICD-10-CM | POA: Diagnosis not present

## 2018-07-12 DIAGNOSIS — C3432 Malignant neoplasm of lower lobe, left bronchus or lung: Secondary | ICD-10-CM | POA: Diagnosis not present

## 2018-07-12 DIAGNOSIS — Z87891 Personal history of nicotine dependence: Secondary | ICD-10-CM | POA: Diagnosis not present

## 2018-07-12 DIAGNOSIS — R918 Other nonspecific abnormal finding of lung field: Secondary | ICD-10-CM | POA: Diagnosis not present

## 2018-07-12 DIAGNOSIS — Z902 Acquired absence of lung [part of]: Secondary | ICD-10-CM | POA: Diagnosis not present

## 2018-07-19 DIAGNOSIS — C3432 Malignant neoplasm of lower lobe, left bronchus or lung: Secondary | ICD-10-CM | POA: Diagnosis not present

## 2018-07-20 DIAGNOSIS — C3432 Malignant neoplasm of lower lobe, left bronchus or lung: Secondary | ICD-10-CM | POA: Diagnosis not present

## 2018-07-22 DIAGNOSIS — R911 Solitary pulmonary nodule: Secondary | ICD-10-CM | POA: Diagnosis not present

## 2018-07-22 DIAGNOSIS — J439 Emphysema, unspecified: Secondary | ICD-10-CM | POA: Diagnosis not present

## 2018-07-24 HISTORY — PX: LUNG SURGERY: SHX703

## 2018-07-25 DIAGNOSIS — C771 Secondary and unspecified malignant neoplasm of intrathoracic lymph nodes: Secondary | ICD-10-CM | POA: Diagnosis not present

## 2018-07-25 DIAGNOSIS — R911 Solitary pulmonary nodule: Secondary | ICD-10-CM | POA: Diagnosis not present

## 2018-07-25 DIAGNOSIS — Z87891 Personal history of nicotine dependence: Secondary | ICD-10-CM | POA: Diagnosis not present

## 2018-07-25 DIAGNOSIS — T797XXA Traumatic subcutaneous emphysema, initial encounter: Secondary | ICD-10-CM | POA: Diagnosis not present

## 2018-07-25 DIAGNOSIS — J939 Pneumothorax, unspecified: Secondary | ICD-10-CM | POA: Diagnosis not present

## 2018-07-25 DIAGNOSIS — Z4682 Encounter for fitting and adjustment of non-vascular catheter: Secondary | ICD-10-CM | POA: Diagnosis not present

## 2018-07-25 DIAGNOSIS — C3490 Malignant neoplasm of unspecified part of unspecified bronchus or lung: Secondary | ICD-10-CM | POA: Diagnosis not present

## 2018-07-25 DIAGNOSIS — Z85118 Personal history of other malignant neoplasm of bronchus and lung: Secondary | ICD-10-CM | POA: Diagnosis not present

## 2018-07-25 DIAGNOSIS — R918 Other nonspecific abnormal finding of lung field: Secondary | ICD-10-CM | POA: Diagnosis not present

## 2018-07-25 DIAGNOSIS — C3432 Malignant neoplasm of lower lobe, left bronchus or lung: Secondary | ICD-10-CM | POA: Diagnosis present

## 2018-07-25 DIAGNOSIS — Z7982 Long term (current) use of aspirin: Secondary | ICD-10-CM | POA: Diagnosis not present

## 2018-07-25 DIAGNOSIS — J449 Chronic obstructive pulmonary disease, unspecified: Secondary | ICD-10-CM | POA: Diagnosis present

## 2018-07-25 DIAGNOSIS — T8182XA Emphysema (subcutaneous) resulting from a procedure, initial encounter: Secondary | ICD-10-CM | POA: Diagnosis not present

## 2018-07-25 DIAGNOSIS — I2510 Atherosclerotic heart disease of native coronary artery without angina pectoris: Secondary | ICD-10-CM | POA: Diagnosis present

## 2018-08-01 DIAGNOSIS — R0602 Shortness of breath: Secondary | ICD-10-CM | POA: Diagnosis not present

## 2018-08-01 DIAGNOSIS — Z87891 Personal history of nicotine dependence: Secondary | ICD-10-CM | POA: Diagnosis not present

## 2018-08-01 DIAGNOSIS — J449 Chronic obstructive pulmonary disease, unspecified: Secondary | ICD-10-CM | POA: Diagnosis not present

## 2018-08-01 DIAGNOSIS — K219 Gastro-esophageal reflux disease without esophagitis: Secondary | ICD-10-CM | POA: Diagnosis not present

## 2018-08-01 DIAGNOSIS — Z7982 Long term (current) use of aspirin: Secondary | ICD-10-CM | POA: Diagnosis not present

## 2018-08-01 DIAGNOSIS — J9383 Other pneumothorax: Secondary | ICD-10-CM | POA: Diagnosis not present

## 2018-08-01 DIAGNOSIS — I2510 Atherosclerotic heart disease of native coronary artery without angina pectoris: Secondary | ICD-10-CM | POA: Diagnosis not present

## 2018-08-01 DIAGNOSIS — Z79899 Other long term (current) drug therapy: Secondary | ICD-10-CM | POA: Diagnosis not present

## 2018-08-01 DIAGNOSIS — J939 Pneumothorax, unspecified: Secondary | ICD-10-CM | POA: Diagnosis not present

## 2018-08-02 DIAGNOSIS — C3432 Malignant neoplasm of lower lobe, left bronchus or lung: Secondary | ICD-10-CM | POA: Diagnosis not present

## 2018-08-02 DIAGNOSIS — J948 Other specified pleural conditions: Secondary | ICD-10-CM | POA: Diagnosis not present

## 2018-08-02 DIAGNOSIS — R911 Solitary pulmonary nodule: Secondary | ICD-10-CM | POA: Diagnosis not present

## 2018-08-02 DIAGNOSIS — Z85118 Personal history of other malignant neoplasm of bronchus and lung: Secondary | ICD-10-CM | POA: Diagnosis not present

## 2018-08-02 DIAGNOSIS — J939 Pneumothorax, unspecified: Secondary | ICD-10-CM | POA: Diagnosis not present

## 2018-08-07 DIAGNOSIS — J439 Emphysema, unspecified: Secondary | ICD-10-CM | POA: Diagnosis not present

## 2018-08-07 DIAGNOSIS — R0602 Shortness of breath: Secondary | ICD-10-CM | POA: Diagnosis not present

## 2018-08-07 DIAGNOSIS — J948 Other specified pleural conditions: Secondary | ICD-10-CM | POA: Diagnosis not present

## 2018-08-07 DIAGNOSIS — T8141XA Infection following a procedure, superficial incisional surgical site, initial encounter: Secondary | ICD-10-CM | POA: Diagnosis not present

## 2018-08-07 DIAGNOSIS — J9589 Other postprocedural complications and disorders of respiratory system, not elsewhere classified: Secondary | ICD-10-CM | POA: Diagnosis not present

## 2018-08-07 DIAGNOSIS — R05 Cough: Secondary | ICD-10-CM | POA: Diagnosis not present

## 2018-08-07 DIAGNOSIS — R0789 Other chest pain: Secondary | ICD-10-CM | POA: Diagnosis not present

## 2018-08-07 DIAGNOSIS — T8149XA Infection following a procedure, other surgical site, initial encounter: Secondary | ICD-10-CM | POA: Diagnosis not present

## 2018-08-07 DIAGNOSIS — J939 Pneumothorax, unspecified: Secondary | ICD-10-CM | POA: Diagnosis not present

## 2018-08-07 DIAGNOSIS — J90 Pleural effusion, not elsewhere classified: Secondary | ICD-10-CM | POA: Diagnosis not present

## 2018-08-07 DIAGNOSIS — J449 Chronic obstructive pulmonary disease, unspecified: Secondary | ICD-10-CM | POA: Diagnosis not present

## 2018-08-07 DIAGNOSIS — L03313 Cellulitis of chest wall: Secondary | ICD-10-CM | POA: Diagnosis not present

## 2018-08-08 DIAGNOSIS — J449 Chronic obstructive pulmonary disease, unspecified: Secondary | ICD-10-CM | POA: Diagnosis present

## 2018-08-08 DIAGNOSIS — B9561 Methicillin susceptible Staphylococcus aureus infection as the cause of diseases classified elsewhere: Secondary | ICD-10-CM | POA: Diagnosis present

## 2018-08-08 DIAGNOSIS — I2510 Atherosclerotic heart disease of native coronary artery without angina pectoris: Secondary | ICD-10-CM | POA: Diagnosis present

## 2018-08-08 DIAGNOSIS — K219 Gastro-esophageal reflux disease without esophagitis: Secondary | ICD-10-CM | POA: Diagnosis present

## 2018-08-08 DIAGNOSIS — J90 Pleural effusion, not elsewhere classified: Secondary | ICD-10-CM | POA: Diagnosis present

## 2018-08-08 DIAGNOSIS — Z20828 Contact with and (suspected) exposure to other viral communicable diseases: Secondary | ICD-10-CM | POA: Diagnosis present

## 2018-08-08 DIAGNOSIS — T8141XA Infection following a procedure, superficial incisional surgical site, initial encounter: Secondary | ICD-10-CM | POA: Diagnosis present

## 2018-08-08 DIAGNOSIS — J939 Pneumothorax, unspecified: Secondary | ICD-10-CM | POA: Diagnosis present

## 2018-08-08 DIAGNOSIS — Z87891 Personal history of nicotine dependence: Secondary | ICD-10-CM | POA: Diagnosis not present

## 2018-08-08 DIAGNOSIS — E785 Hyperlipidemia, unspecified: Secondary | ICD-10-CM | POA: Diagnosis present

## 2018-08-08 DIAGNOSIS — J948 Other specified pleural conditions: Secondary | ICD-10-CM | POA: Diagnosis not present

## 2018-08-08 DIAGNOSIS — Z7982 Long term (current) use of aspirin: Secondary | ICD-10-CM | POA: Diagnosis not present

## 2018-08-08 DIAGNOSIS — J9589 Other postprocedural complications and disorders of respiratory system, not elsewhere classified: Secondary | ICD-10-CM | POA: Diagnosis present

## 2018-08-08 DIAGNOSIS — R000 Tachycardia, unspecified: Secondary | ICD-10-CM | POA: Diagnosis present

## 2018-08-08 DIAGNOSIS — L03313 Cellulitis of chest wall: Secondary | ICD-10-CM | POA: Diagnosis present

## 2018-08-08 DIAGNOSIS — Z9049 Acquired absence of other specified parts of digestive tract: Secondary | ICD-10-CM | POA: Diagnosis not present

## 2018-08-08 DIAGNOSIS — Z85118 Personal history of other malignant neoplasm of bronchus and lung: Secondary | ICD-10-CM | POA: Diagnosis not present

## 2018-08-08 DIAGNOSIS — R918 Other nonspecific abnormal finding of lung field: Secondary | ICD-10-CM | POA: Diagnosis not present

## 2018-08-13 DIAGNOSIS — Z792 Long term (current) use of antibiotics: Secondary | ICD-10-CM | POA: Diagnosis not present

## 2018-08-13 DIAGNOSIS — J449 Chronic obstructive pulmonary disease, unspecified: Secondary | ICD-10-CM | POA: Diagnosis not present

## 2018-08-13 DIAGNOSIS — T8141XA Infection following a procedure, superficial incisional surgical site, initial encounter: Secondary | ICD-10-CM | POA: Diagnosis not present

## 2018-08-13 DIAGNOSIS — L03313 Cellulitis of chest wall: Secondary | ICD-10-CM | POA: Diagnosis not present

## 2018-08-13 DIAGNOSIS — Z7982 Long term (current) use of aspirin: Secondary | ICD-10-CM | POA: Diagnosis not present

## 2018-08-13 DIAGNOSIS — B9561 Methicillin susceptible Staphylococcus aureus infection as the cause of diseases classified elsewhere: Secondary | ICD-10-CM | POA: Diagnosis not present

## 2018-08-13 DIAGNOSIS — Z9181 History of falling: Secondary | ICD-10-CM | POA: Diagnosis not present

## 2018-08-13 DIAGNOSIS — I2510 Atherosclerotic heart disease of native coronary artery without angina pectoris: Secondary | ICD-10-CM | POA: Diagnosis not present

## 2018-08-13 DIAGNOSIS — C3492 Malignant neoplasm of unspecified part of left bronchus or lung: Secondary | ICD-10-CM | POA: Diagnosis not present

## 2018-08-13 DIAGNOSIS — K219 Gastro-esophageal reflux disease without esophagitis: Secondary | ICD-10-CM | POA: Diagnosis not present

## 2018-08-13 DIAGNOSIS — Z9981 Dependence on supplemental oxygen: Secondary | ICD-10-CM | POA: Diagnosis not present

## 2018-08-13 DIAGNOSIS — Z4803 Encounter for change or removal of drains: Secondary | ICD-10-CM | POA: Diagnosis not present

## 2018-08-14 DIAGNOSIS — J449 Chronic obstructive pulmonary disease, unspecified: Secondary | ICD-10-CM | POA: Diagnosis not present

## 2018-08-14 DIAGNOSIS — B9561 Methicillin susceptible Staphylococcus aureus infection as the cause of diseases classified elsewhere: Secondary | ICD-10-CM | POA: Diagnosis not present

## 2018-08-14 DIAGNOSIS — L03313 Cellulitis of chest wall: Secondary | ICD-10-CM | POA: Diagnosis not present

## 2018-08-14 DIAGNOSIS — C3492 Malignant neoplasm of unspecified part of left bronchus or lung: Secondary | ICD-10-CM | POA: Diagnosis not present

## 2018-08-14 DIAGNOSIS — I2510 Atherosclerotic heart disease of native coronary artery without angina pectoris: Secondary | ICD-10-CM | POA: Diagnosis not present

## 2018-08-14 DIAGNOSIS — T8141XA Infection following a procedure, superficial incisional surgical site, initial encounter: Secondary | ICD-10-CM | POA: Diagnosis not present

## 2018-08-17 DIAGNOSIS — J449 Chronic obstructive pulmonary disease, unspecified: Secondary | ICD-10-CM | POA: Diagnosis not present

## 2018-08-17 DIAGNOSIS — B9561 Methicillin susceptible Staphylococcus aureus infection as the cause of diseases classified elsewhere: Secondary | ICD-10-CM | POA: Diagnosis not present

## 2018-08-17 DIAGNOSIS — L03313 Cellulitis of chest wall: Secondary | ICD-10-CM | POA: Diagnosis not present

## 2018-08-17 DIAGNOSIS — I2510 Atherosclerotic heart disease of native coronary artery without angina pectoris: Secondary | ICD-10-CM | POA: Diagnosis not present

## 2018-08-17 DIAGNOSIS — C3492 Malignant neoplasm of unspecified part of left bronchus or lung: Secondary | ICD-10-CM | POA: Diagnosis not present

## 2018-08-17 DIAGNOSIS — T8141XA Infection following a procedure, superficial incisional surgical site, initial encounter: Secondary | ICD-10-CM | POA: Diagnosis not present

## 2018-08-23 DIAGNOSIS — J90 Pleural effusion, not elsewhere classified: Secondary | ICD-10-CM | POA: Diagnosis not present

## 2018-08-23 DIAGNOSIS — C3432 Malignant neoplasm of lower lobe, left bronchus or lung: Secondary | ICD-10-CM | POA: Diagnosis not present

## 2018-10-24 DIAGNOSIS — J439 Emphysema, unspecified: Secondary | ICD-10-CM | POA: Diagnosis not present

## 2018-10-24 DIAGNOSIS — C3432 Malignant neoplasm of lower lobe, left bronchus or lung: Secondary | ICD-10-CM | POA: Diagnosis not present

## 2018-10-24 DIAGNOSIS — I700 Atherosclerosis of aorta: Secondary | ICD-10-CM | POA: Diagnosis not present

## 2018-10-24 DIAGNOSIS — J438 Other emphysema: Secondary | ICD-10-CM | POA: Diagnosis not present

## 2018-10-24 DIAGNOSIS — I2510 Atherosclerotic heart disease of native coronary artery without angina pectoris: Secondary | ICD-10-CM | POA: Diagnosis not present

## 2018-10-24 DIAGNOSIS — Z902 Acquired absence of lung [part of]: Secondary | ICD-10-CM | POA: Diagnosis not present

## 2018-10-24 DIAGNOSIS — J948 Other specified pleural conditions: Secondary | ICD-10-CM | POA: Diagnosis not present

## 2018-10-24 DIAGNOSIS — J432 Centrilobular emphysema: Secondary | ICD-10-CM | POA: Diagnosis not present

## 2018-10-24 DIAGNOSIS — Z85118 Personal history of other malignant neoplasm of bronchus and lung: Secondary | ICD-10-CM | POA: Diagnosis not present

## 2018-10-24 DIAGNOSIS — Z9889 Other specified postprocedural states: Secondary | ICD-10-CM | POA: Diagnosis not present

## 2018-10-25 DIAGNOSIS — C3480 Malignant neoplasm of overlapping sites of unspecified bronchus and lung: Secondary | ICD-10-CM | POA: Diagnosis not present

## 2018-11-08 DIAGNOSIS — Z1331 Encounter for screening for depression: Secondary | ICD-10-CM | POA: Diagnosis not present

## 2018-11-08 DIAGNOSIS — Z9181 History of falling: Secondary | ICD-10-CM | POA: Diagnosis not present

## 2018-11-08 DIAGNOSIS — R7303 Prediabetes: Secondary | ICD-10-CM | POA: Diagnosis not present

## 2018-11-08 DIAGNOSIS — E559 Vitamin D deficiency, unspecified: Secondary | ICD-10-CM | POA: Diagnosis not present

## 2018-11-08 DIAGNOSIS — K5909 Other constipation: Secondary | ICD-10-CM | POA: Diagnosis not present

## 2018-11-08 DIAGNOSIS — K219 Gastro-esophageal reflux disease without esophagitis: Secondary | ICD-10-CM | POA: Diagnosis not present

## 2018-11-08 DIAGNOSIS — E7800 Pure hypercholesterolemia, unspecified: Secondary | ICD-10-CM | POA: Diagnosis not present

## 2018-11-08 DIAGNOSIS — J449 Chronic obstructive pulmonary disease, unspecified: Secondary | ICD-10-CM | POA: Diagnosis not present

## 2018-11-10 DIAGNOSIS — H401132 Primary open-angle glaucoma, bilateral, moderate stage: Secondary | ICD-10-CM | POA: Diagnosis not present

## 2019-01-23 DIAGNOSIS — C3490 Malignant neoplasm of unspecified part of unspecified bronchus or lung: Secondary | ICD-10-CM | POA: Diagnosis not present

## 2019-01-23 DIAGNOSIS — J439 Emphysema, unspecified: Secondary | ICD-10-CM | POA: Diagnosis not present

## 2019-01-23 DIAGNOSIS — Z902 Acquired absence of lung [part of]: Secondary | ICD-10-CM | POA: Diagnosis not present

## 2019-01-23 DIAGNOSIS — I2510 Atherosclerotic heart disease of native coronary artery without angina pectoris: Secondary | ICD-10-CM | POA: Diagnosis not present

## 2019-01-23 DIAGNOSIS — C3480 Malignant neoplasm of overlapping sites of unspecified bronchus and lung: Secondary | ICD-10-CM | POA: Diagnosis not present

## 2019-01-23 DIAGNOSIS — I700 Atherosclerosis of aorta: Secondary | ICD-10-CM | POA: Diagnosis not present

## 2019-01-24 DIAGNOSIS — C3432 Malignant neoplasm of lower lobe, left bronchus or lung: Secondary | ICD-10-CM | POA: Diagnosis not present

## 2019-01-30 DIAGNOSIS — Z87891 Personal history of nicotine dependence: Secondary | ICD-10-CM | POA: Diagnosis not present

## 2019-01-30 DIAGNOSIS — Z85118 Personal history of other malignant neoplasm of bronchus and lung: Secondary | ICD-10-CM | POA: Diagnosis not present

## 2019-01-30 DIAGNOSIS — Z6826 Body mass index (BMI) 26.0-26.9, adult: Secondary | ICD-10-CM | POA: Diagnosis not present

## 2019-01-30 DIAGNOSIS — J441 Chronic obstructive pulmonary disease with (acute) exacerbation: Secondary | ICD-10-CM | POA: Diagnosis not present

## 2019-02-02 ENCOUNTER — Telehealth: Payer: Self-pay | Admitting: Internal Medicine

## 2019-02-02 NOTE — Telephone Encounter (Signed)
Call returned to patient, she reports [facility] sent over a referral and she was wanting to go ahead and schedule the consult. Consult appointment made. Patient aware to bring CT Discs with her to the appointment. Aware to get copy of D/C summary. Nothing further needed at this time.

## 2019-02-09 ENCOUNTER — Encounter: Payer: Self-pay | Admitting: Pulmonary Disease

## 2019-02-09 ENCOUNTER — Other Ambulatory Visit: Payer: Self-pay

## 2019-02-09 ENCOUNTER — Ambulatory Visit (INDEPENDENT_AMBULATORY_CARE_PROVIDER_SITE_OTHER): Payer: Medicare Other | Admitting: Pulmonary Disease

## 2019-02-09 VITALS — BP 124/66 | HR 90 | Ht 65.0 in | Wt 159.8 lb

## 2019-02-09 DIAGNOSIS — R0602 Shortness of breath: Secondary | ICD-10-CM | POA: Diagnosis not present

## 2019-02-09 MED ORDER — TRELEGY ELLIPTA 100-62.5-25 MCG/INH IN AEPB: 1.0000 | INHALATION_SPRAY | Freq: Every day | RESPIRATORY_TRACT | 2 refills | Status: DC

## 2019-02-09 MED ORDER — TRELEGY ELLIPTA 100-62.5-25 MCG/INH IN AEPB: 1.0000 | INHALATION_SPRAY | Freq: Every day | RESPIRATORY_TRACT | 5 refills | Status: DC

## 2019-02-09 MED ORDER — TRELEGY ELLIPTA 100-62.5-25 MCG/INH IN AEPB: 1.0000 | INHALATION_SPRAY | Freq: Every day | RESPIRATORY_TRACT | 0 refills | Status: DC

## 2019-02-09 NOTE — Progress Notes (Signed)
Subjective:     Patient ID: Bailey Nguyen, female   DOB: [birth year], 73 y.o.   MRN: [id number]  Patient being seen for COPD evaluation and follow-up  Longstanding history of COPD/emphysema History of lung cancer for which he had upper right lobectomy in 2016 March 2020 did have left lower lobe, left upper lobe wedge resection She did receive some radiation treatments as well  Uses oxygen as needed  Was recently treated for bronchitic exacerbation of COPD with a course of steroids and doxycycline She is feeling better  Usually able to maintain normal activity  Does have some pain and discomfort around the site of surgery  Past Medical History:  Diagnosis Date  . Anxiety   . Back ache   . Chest wall pain   . Chronic constipation   . COPD (chronic obstructive pulmonary disease) ([place])   . Coronary artery calcification seen on CT scan 01/01/2016  . Diverticula of intestine    sigmond  . Dyskinesia of esophagus   . GERD (gastroesophageal reflux disease)   . Glaucoma   . Hx of colonic polyps   . Hx of Lyme disease   . Hx of TIA (transient ischemic attack) and stroke   . Hypercholesteremia   . Lung cancer ([place])    Removed upper right portion of lung   . Neck pain   . Osteoporosis   . Prediabetes   . Splenic infarct   . Tobacco use disorder    Social History   Socioeconomic History  . Marital status: Married    Spouse name: Not on file  . Number of children: 5  . Years of education: Not on file  . Highest education level: Not on file  Occupational History  . Not on file  Social Needs  . Financial resource strain: Not on file  . Food insecurity    Worry: Not on file    Inability: Not on file  . Transportation needs    Medical: Not on file    Non-medical: Not on file  Tobacco Use  . Smoking status: Former Smoker    Packs/day: 1.00    Types: Cigarettes    Quit date: 02/01/2015    Years since quitting: 4.0  . Smokeless tobacco: Never Used  Substance and Sexual  Activity  . Alcohol use: No    Alcohol/week: 0.0 standard drinks  . Drug use: No  . Sexual activity: Not on file  Lifestyle  . Physical activity    Days per week: Not on file    Minutes per session: Not on file  . Stress: Not on file  Relationships  . Social Herbalist on phone: Not on file    Gets together: Not on file    Attends religious service: Not on file    Active member of club or organization: Not on file    Attends meetings of clubs or organizations: Not on file    Relationship status: Not on file  . Intimate partner violence    Fear of current or ex partner: Not on file    Emotionally abused: Not on file    Physically abused: Not on file    Forced sexual activity: Not on file  Other Topics Concern  . Not on file  Social History Narrative  . Not on file   Family History  Problem Relation Age of Onset  . Diabetes Sister   . Diabetes Brother   . Lung cancer Brother   . Diabetes  Father   . Pancreatic cancer Father   . Coronary artery disease Mother 21  . Thyroid cancer Daughter   . Colon cancer Neg Hx   . Esophageal cancer Neg Hx   . Stomach cancer Neg Hx   . Rectal cancer Neg Hx     Review of Systems  Constitutional: Negative for fever and unexpected weight change.  HENT: Positive for postnasal drip and trouble swallowing. Negative for congestion, dental problem, ear pain, nosebleeds, rhinorrhea, sinus pressure, sneezing and sore throat.   Eyes: Negative for redness and itching.  Respiratory: Positive for cough, chest tightness, shortness of breath and wheezing.   Cardiovascular: Negative for palpitations and leg swelling.  Gastrointestinal: Negative for nausea and vomiting.  Genitourinary: Negative for dysuria.  Musculoskeletal: Negative for joint swelling.  Skin: Negative for rash.  Allergic/Immunologic: Negative.  Negative for environmental allergies, food allergies and immunocompromised state.  Neurological: Negative for headaches.   Hematological: Does not bruise/bleed easily.  Psychiatric/Behavioral: Negative for dysphoric mood. The patient is not nervous/anxious.        Objective:   Physical Exam HENT:     Head: Normocephalic and atraumatic.     Nose: No congestion.  Eyes:     General:        Right eye: No discharge.        Left eye: No discharge.     Pupils: Pupils are equal, round, and reactive to light.  Neck:     Musculoskeletal: Normal range of motion and neck supple. No neck rigidity.  Cardiovascular:     Rate and Rhythm: Normal rate and regular rhythm.     Pulses: Normal pulses.     Heart sounds: Normal heart sounds. No murmur.  Pulmonary:     Effort: Pulmonary effort is normal. No respiratory distress.     Breath sounds: Rhonchi present.  Abdominal:     General: Abdomen is flat. There is no distension.  Musculoskeletal: Normal range of motion.        General: No swelling.  Skin:    General: Skin is warm and dry.     Coloration: Skin is not jaundiced.  Neurological:     Mental Status: She is alert. Mental status is at baseline.  Psychiatric:        Mood and Affect: Mood normal.    Vitals:   02/09/19 1433  BP: 124/66  Pulse: 90  SpO2: 98%   CT scan of the chest reviewed September 2020-CT reviewed by myself Changes of recent surgery, no lung nodules, extensive emphysema    Assessment:     Advanced chronic obstructive pulmonary disease Extensive emphysema on CT scan of the chest History of lung cancer status post resection Recent bronchitis    Plan:     Obtain a pulmonary function test  Encouraged the patient start on a regular basis  Regular graded exercises was encouraged  We will see her back in the office in about 3 months  We will start on Trelegy to be used daily Encouraged to continue using rescue inhalers as needed  Encouraged to bring [place] a CD of CT scheduled for December

## 2019-02-09 NOTE — Patient Instructions (Signed)
History of COPD Wedge resection 2 lung surgeries in the past Recent bronchitis  We will start you on Trelegy-provide you with a sample  I will see you back in about 3 months We will get a breathing study prior to next visit  Call with significant concerns

## 2019-02-10 ENCOUNTER — Telehealth: Payer: Self-pay | Admitting: Pulmonary Disease

## 2019-02-10 NOTE — Telephone Encounter (Signed)
Called and spoke with Patient.  Trelegy prescription sent to [facility] 02/09/19. Patient made aware prescription was sent to [facility].  Nothing further at this time.

## 2019-02-15 DIAGNOSIS — J441 Chronic obstructive pulmonary disease with (acute) exacerbation: Secondary | ICD-10-CM | POA: Diagnosis not present

## 2019-02-15 DIAGNOSIS — Z7982 Long term (current) use of aspirin: Secondary | ICD-10-CM | POA: Diagnosis not present

## 2019-02-15 DIAGNOSIS — R630 Anorexia: Secondary | ICD-10-CM | POA: Diagnosis not present

## 2019-02-15 DIAGNOSIS — I2510 Atherosclerotic heart disease of native coronary artery without angina pectoris: Secondary | ICD-10-CM | POA: Diagnosis not present

## 2019-02-15 DIAGNOSIS — C3490 Malignant neoplasm of unspecified part of unspecified bronchus or lung: Secondary | ICD-10-CM | POA: Diagnosis not present

## 2019-02-15 DIAGNOSIS — Z87891 Personal history of nicotine dependence: Secondary | ICD-10-CM | POA: Diagnosis not present

## 2019-02-15 DIAGNOSIS — Z20828 Contact with and (suspected) exposure to other viral communicable diseases: Secondary | ICD-10-CM | POA: Diagnosis not present

## 2019-02-15 DIAGNOSIS — Z79899 Other long term (current) drug therapy: Secondary | ICD-10-CM | POA: Diagnosis not present

## 2019-02-15 DIAGNOSIS — K219 Gastro-esophageal reflux disease without esophagitis: Secondary | ICD-10-CM | POA: Diagnosis not present

## 2019-02-15 DIAGNOSIS — R0602 Shortness of breath: Secondary | ICD-10-CM | POA: Diagnosis not present

## 2019-03-01 DIAGNOSIS — H04123 Dry eye syndrome of bilateral lacrimal glands: Secondary | ICD-10-CM | POA: Diagnosis not present

## 2019-03-01 DIAGNOSIS — H401132 Primary open-angle glaucoma, bilateral, moderate stage: Secondary | ICD-10-CM | POA: Diagnosis not present

## 2019-03-07 DIAGNOSIS — J441 Chronic obstructive pulmonary disease with (acute) exacerbation: Secondary | ICD-10-CM | POA: Diagnosis not present

## 2019-04-05 DIAGNOSIS — H538 Other visual disturbances: Secondary | ICD-10-CM | POA: Diagnosis not present

## 2019-04-05 DIAGNOSIS — Z5321 Procedure and treatment not carried out due to patient leaving prior to being seen by health care provider: Secondary | ICD-10-CM | POA: Diagnosis not present

## 2019-05-01 ENCOUNTER — Ambulatory Visit: Payer: Medicare Other | Admitting: Pulmonary Disease

## 2019-05-02 DIAGNOSIS — I2510 Atherosclerotic heart disease of native coronary artery without angina pectoris: Secondary | ICD-10-CM | POA: Diagnosis not present

## 2019-05-02 DIAGNOSIS — R9082 White matter disease, unspecified: Secondary | ICD-10-CM | POA: Diagnosis not present

## 2019-05-02 DIAGNOSIS — J449 Chronic obstructive pulmonary disease, unspecified: Secondary | ICD-10-CM | POA: Diagnosis not present

## 2019-05-02 DIAGNOSIS — R200 Anesthesia of skin: Secondary | ICD-10-CM | POA: Diagnosis not present

## 2019-05-02 DIAGNOSIS — Z85118 Personal history of other malignant neoplasm of bronchus and lung: Secondary | ICD-10-CM | POA: Diagnosis not present

## 2019-05-02 DIAGNOSIS — K219 Gastro-esophageal reflux disease without esophagitis: Secondary | ICD-10-CM | POA: Diagnosis not present

## 2019-05-02 DIAGNOSIS — Z79899 Other long term (current) drug therapy: Secondary | ICD-10-CM | POA: Diagnosis not present

## 2019-05-02 DIAGNOSIS — I639 Cerebral infarction, unspecified: Secondary | ICD-10-CM | POA: Diagnosis not present

## 2019-05-02 DIAGNOSIS — H538 Other visual disturbances: Secondary | ICD-10-CM | POA: Diagnosis not present

## 2019-05-02 DIAGNOSIS — Z87891 Personal history of nicotine dependence: Secondary | ICD-10-CM | POA: Diagnosis not present

## 2019-05-02 DIAGNOSIS — Z7982 Long term (current) use of aspirin: Secondary | ICD-10-CM | POA: Diagnosis not present

## 2019-05-29 DIAGNOSIS — R0600 Dyspnea, unspecified: Secondary | ICD-10-CM | POA: Diagnosis not present

## 2019-05-29 DIAGNOSIS — R911 Solitary pulmonary nodule: Secondary | ICD-10-CM | POA: Diagnosis not present

## 2019-05-30 DIAGNOSIS — C3432 Malignant neoplasm of lower lobe, left bronchus or lung: Secondary | ICD-10-CM | POA: Diagnosis not present

## 2019-05-30 DIAGNOSIS — I2510 Atherosclerotic heart disease of native coronary artery without angina pectoris: Secondary | ICD-10-CM | POA: Diagnosis not present

## 2019-06-07 DIAGNOSIS — I2510 Atherosclerotic heart disease of native coronary artery without angina pectoris: Secondary | ICD-10-CM | POA: Diagnosis not present

## 2019-06-07 DIAGNOSIS — Z6826 Body mass index (BMI) 26.0-26.9, adult: Secondary | ICD-10-CM | POA: Diagnosis not present

## 2019-06-07 DIAGNOSIS — Z85118 Personal history of other malignant neoplasm of bronchus and lung: Secondary | ICD-10-CM | POA: Diagnosis not present

## 2019-06-07 DIAGNOSIS — J441 Chronic obstructive pulmonary disease with (acute) exacerbation: Secondary | ICD-10-CM | POA: Diagnosis not present

## 2019-06-07 DIAGNOSIS — Z139 Encounter for screening, unspecified: Secondary | ICD-10-CM | POA: Diagnosis not present

## 2019-06-09 ENCOUNTER — Ambulatory Visit: Payer: Medicare Other | Admitting: Gastroenterology

## 2019-06-13 DIAGNOSIS — H401132 Primary open-angle glaucoma, bilateral, moderate stage: Secondary | ICD-10-CM | POA: Diagnosis not present

## 2019-06-16 ENCOUNTER — Ambulatory Visit: Payer: Medicare Other | Admitting: Gastroenterology

## 2019-06-21 ENCOUNTER — Encounter: Payer: Self-pay | Admitting: Gastroenterology

## 2019-06-21 ENCOUNTER — Ambulatory Visit (INDEPENDENT_AMBULATORY_CARE_PROVIDER_SITE_OTHER): Payer: Medicare Other | Admitting: Gastroenterology

## 2019-06-21 ENCOUNTER — Other Ambulatory Visit: Payer: Self-pay

## 2019-06-21 VITALS — BP 120/76 | HR 90 | Temp 97.1°F | Ht 65.0 in | Wt 162.4 lb

## 2019-06-21 DIAGNOSIS — R1310 Dysphagia, unspecified: Secondary | ICD-10-CM

## 2019-06-21 DIAGNOSIS — R1013 Epigastric pain: Secondary | ICD-10-CM | POA: Diagnosis not present

## 2019-06-21 MED ORDER — PANTOPRAZOLE SODIUM 40 MG PO TBEC: 40.0000 mg | DELAYED_RELEASE_TABLET | Freq: Two times a day (BID) | ORAL | 6 refills | Status: DC

## 2019-06-21 NOTE — Patient Instructions (Signed)
If you are age 72 or older, your body mass index should be between 23-30. Your Body mass index is 27.02 kg/m. If this is out of the aforementioned range listed, please consider follow up with your Primary Care Provider.  If you are age 74 or younger, your body mass index should be between 19-25. Your Body mass index is 27.02 kg/m. If this is out of the aformentioned range listed, please consider follow up with your Primary Care Provider.   We have sent the following medications to your pharmacy for you to pick up at your convenience: Protonix   You will be contacted with an appointment for your Ultrasound.   Thank you,  Dr. Jackquline Denmark

## 2019-06-21 NOTE — Progress Notes (Signed)
Chief Complaint: FU  Referring Provider:  Philmore Pali, NP      ASSESSMENT AND PLAN;   #1. Epigastric pain - neg CT abd/pelvis 12/2017, neg EGD 05/2018 #2. GERD with dysphagia. No H/O XRT.  Resolved after EGD with dil 05/2018 #3. Associated COPD, 3V CAD, chronic neck and low back pain, HLD, anxiety. #4. H/O Lung AdenoCa s/p RUL lobectomy 01/2015, another lung adeno CA  LLL lobe s/p wedge resection 07/2018 followed by XRT.  Plan: - Protonix 40mg  po bid. #60, 6 refills. - CBC, CMP and lipase - [place] abdo (she would like it to be done in [place]) - She has Cardio appt (on 07/17/2019). She will call and find out if it can be moved earlier.  Addendum  barium swallow on 04/28/2018 at [facility] hiatal hernia, reflux to upper esophagus which empties slowly, occasional tertiary contractions.  13 mm tablet passed freely.  No masses or ulcerations. HPI:    Bailey Nguyen is a 74 y.o. female  Follow-up Recurrent epi pain, radiating to back Occ SOB (but has underlying COPD and lung surgery as above) Has appt with cardiology as pulmonary was concerned about three-vessel disease found on lung CT.  She continues to have occasional heartburn.  Did feel better when she was on twice daily Protonix.  Minimum dysphagia.  Certainly better after esophageal dilatation.  No fever chills or night sweats.  No weight loss.   From previous records: -dysphagia mostly to solids, no problems with liquids, mid chest. S/P EGDs with dil in 2013, 2016 and 2018, 2020 with good relief Had colonoscopy 09/2013-limited due to preparation, grossly negative except for moderate sigmoid diverticulosis and internal hemorrhoids. Had epigastric pain in August 2019, went to [facility], underwent CT scan of the abdomen pelvis which was unremarkable.  Does not want repeat colonoscopy.  CT Chest 04/2019 1. No evidence of local tumor recurrence status post right upper lobectomy and medial left lower lobe wedge  resection. 2. No findings of metastatic disease in the chest. 3. Three-vessel coronary atherosclerosis.  PET 06/2018 Abdomen/Pelvis: - Liver: Multiple subcentimeter hepatic hypodensities are unchanged from 2016 activity and exhibit no FDG uptake. Scattered parenchymal calcifications are noted. - Gallbladder: Surgically absent. - Spleen: No splenomegaly. No focal abnormalities. - Pancreas: No focal abnormality - Adrenal glands: Unremarkable - Kidneys: Unremarkable - GI Tract: Colonic diverticulosis. - GU Tract: Unremarkable - Adenopathy: None  Past Medical History:  Diagnosis Date  . Anxiety   . Back ache   . Chest wall pain   . Chronic constipation   . COPD (chronic obstructive pulmonary disease) ([place])   . Coronary artery calcification seen on CT scan 01/01/2016  . Diverticula of intestine    sigmond  . Dyskinesia of esophagus   . GERD (gastroesophageal reflux disease)   . Glaucoma   . Hx of colonic polyps   . Hx of Lyme disease   . Hx of TIA (transient ischemic attack) and stroke   . Hypercholesteremia   . Lung cancer ([place])    Removed upper right portion of lung   . Neck pain   . Osteoporosis   . Prediabetes   . Splenic infarct   . Tobacco use disorder     Past Surgical History:  Procedure Laterality Date  . BACK SURGERY    . CHOLECYSTECTOMY    . COLONOSCOPY  09/22/2013   Moderate predominantly sigmoid diverticulosis. Moderate internal hemorrhoids. Otherwise normal colonoscopy. Limited due to qualityt of prep/.  . ESOPHAGOGASTRODUODENOSCOPY  03/05/2017   Peptic esophageal stricture. Gastritis.   Marland Kitchen LUNG REMOVAL, PARTIAL Right 01/2015  . TUBAL LIGATION      Family History  Problem Relation Age of Onset  . Diabetes Sister   . Diabetes Brother   . Lung cancer Brother   . Diabetes Father   . Pancreatic cancer Father   . Coronary artery disease Mother 53  . Thyroid cancer Daughter   . Colon cancer Neg Hx   . Esophageal cancer Neg Hx   . Stomach cancer Neg Hx    . Rectal cancer Neg Hx     Social History   Tobacco Use  . Smoking status: Former Smoker    Packs/day: 1.00    Types: Cigarettes    Quit date: 02/01/2015    Years since quitting: 4.3  . Smokeless tobacco: Never Used  Substance Use Topics  . Alcohol use: No    Alcohol/week: 0.0 standard drinks  . Drug use: No    Current Outpatient Medications  Medication Sig Dispense Refill  . ALBUTEROL IN Inhale into the lungs as needed.    Marland Kitchen aspirin 325 MG tablet Take by mouth as needed.    . brinzolamide (AZOPT) 1 % ophthalmic suspension Place 1 drop into the right eye 2 (two) times daily.    . cyclobenzaprine (FLEXERIL) 5 MG tablet Take 5 mg by mouth 3 (three) times daily as needed. (back muscle pain)  1  . Fluticasone-Umeclidin-Vilant (TRELEGY ELLIPTA) 100-62.5-25 MCG/INH AEPB Inhale 1 puff into the lungs daily. 60 each 5  . ibuprofen (ADVIL,MOTRIN) 800 MG tablet Take 800 mg by mouth as needed.    . nystatin (MYCOSTATIN) 100000 UNIT/ML suspension as needed.    . pantoprazole (PROTONIX) 40 MG tablet Take 40 mg by mouth daily.    . polyethylene glycol (MIRALAX / GLYCOLAX) packet Take 17 g by mouth daily as needed for constipation.    . rosuvastatin (CRESTOR) 40 MG tablet Take 40 mg by mouth daily.     No current facility-administered medications for this visit.    No Known Allergies  Review of Systems:  Negative except for HPI.     Physical Exam:    BP 120/76   Pulse 90   Temp (!) 97.1 F (36.2 C)   Ht 5\' 5"  (1.651 m)   Wt 162 lb 6 oz (73.7 kg)   BMI 27.02 kg/m  Filed Weights   06/21/19 1333  Weight: 162 lb 6 oz (73.7 kg)   Constitutional:  Well-developed, in no acute distress. Psychiatric: Normal mood and affect. Behavior is normal. HEENT: Pupils normal.  Conjunctivae are normal. No scleral icterus. Neck supple.  Cardiovascular: Normal rate, regular rhythm. No edema Pulmonary/chest: Bilateral decreased breath sounds.  No wheezing or rhonchi. Abdominal: Soft,  nondistended. Nontender. Bowel sounds active throughout. There are no masses palpable. No hepatomegaly. Rectal:  defered Neurological: Alert and oriented to person place and time. Skin: Skin is warm and dry. No rashes noted.  Data Reviewed: I have personally reviewed following labs and imaging studies  CBC: CBC Latest Ref Rng & Units 07/02/2008 07/22/2007  WBC - - 6.3  Hemoglobin 12.0 - 15.0 g/dL 15.6(H) 14.5  Hematocrit 36.0 - 46.0 % 46.0 41.6  Platelets - - 277    CMP: CMP Latest Ref Rng & Units 07/02/2008 07/22/2007  Glucose 70 - 99 mg/dL 102(H) 74  BUN 6 - 23 mg/dL 17 14  Creatinine 0.4 - 1.2 mg/dL 0.6 0.85  Sodium 135 - 145 mEq/L 142 141  Potassium 3.5 - 5.1 mEq/L 3.9 3.9  Chloride 96 - 112 mEq/L 110 107  CO2 - - 26  Calcium - - 9.0  I spent 25 minutes of face-to-face time with the patient. Greater than 50% of the time was spent counseling and coordinating care.    Carmell Austria, MD 06/21/2019, 1:43 PM  Cc: Philmore Pali, NP

## 2019-06-23 NOTE — Addendum Note (Signed)
Addended by: Karena Addison on: 06/23/2019 09:55 AM   Modules accepted: Orders

## 2019-06-25 DIAGNOSIS — Z9049 Acquired absence of other specified parts of digestive tract: Secondary | ICD-10-CM | POA: Diagnosis not present

## 2019-06-25 DIAGNOSIS — R1013 Epigastric pain: Secondary | ICD-10-CM | POA: Diagnosis not present

## 2019-06-25 DIAGNOSIS — K7689 Other specified diseases of liver: Secondary | ICD-10-CM | POA: Diagnosis not present

## 2019-06-27 ENCOUNTER — Telehealth: Payer: Self-pay | Admitting: Gastroenterology

## 2019-06-27 NOTE — Telephone Encounter (Signed)
US-looks good. S/p cholecystectomy Liver cysts-which are benign.  RG

## 2019-06-27 NOTE — Telephone Encounter (Signed)
Please review previous message and advise (results in [place])

## 2019-06-27 NOTE — Telephone Encounter (Signed)
Pt inquired about results of US.  

## 2019-06-28 NOTE — Telephone Encounter (Signed)
Left message for patient to call back to the office;  

## 2019-06-29 DIAGNOSIS — Z1231 Encounter for screening mammogram for malignant neoplasm of breast: Secondary | ICD-10-CM | POA: Diagnosis not present

## 2019-06-30 NOTE — Telephone Encounter (Signed)
Called and spoke with Bailey Nguyen-Bailey Nguyen informed of result note and MD recommendations; Bailey Nguyen is agreeable with plan of care and verified PCP; result note routed to PCP per Bailey Nguyen request; Bailey Nguyen verbalized understanding of information/instructions;  Bailey Nguyen was advised to call the office at [phone] if questions/concerns arise;  Bailey Nguyen is requesting a call back from Dr. Nyra Market is requesting to know when she is due for her EGD-please advise

## 2019-07-01 NOTE — Telephone Encounter (Signed)
Had EGD with dilatation 05/2018 Repeat EGD only if she starts having dysphagia again Otherwise hold off.   RG

## 2019-07-03 NOTE — Telephone Encounter (Signed)
Attempted to reach patient-unable to leave a VM-will attempt to reach patient at a later date/time; 

## 2019-07-04 NOTE — Telephone Encounter (Signed)
Attempted to reach patient-unable to leave a VM-will attempt to reach patient at a later date/time; 

## 2019-07-07 NOTE — Telephone Encounter (Signed)
Left message for patient to call back to the office;  

## 2019-07-11 NOTE — Telephone Encounter (Signed)
Unable to contact letter sent to patient;

## 2019-07-11 NOTE — Telephone Encounter (Signed)
Left message for patient to call back to the office;  

## 2019-07-20 DIAGNOSIS — J324 Chronic pansinusitis: Secondary | ICD-10-CM | POA: Diagnosis not present

## 2019-07-20 DIAGNOSIS — R0982 Postnasal drip: Secondary | ICD-10-CM | POA: Diagnosis not present

## 2019-07-24 DIAGNOSIS — J439 Emphysema, unspecified: Secondary | ICD-10-CM | POA: Diagnosis not present

## 2019-07-24 DIAGNOSIS — I700 Atherosclerosis of aorta: Secondary | ICD-10-CM | POA: Diagnosis not present

## 2019-07-24 DIAGNOSIS — C3480 Malignant neoplasm of overlapping sites of unspecified bronchus and lung: Secondary | ICD-10-CM | POA: Diagnosis not present

## 2019-07-24 DIAGNOSIS — C3490 Malignant neoplasm of unspecified part of unspecified bronchus or lung: Secondary | ICD-10-CM | POA: Diagnosis not present

## 2019-08-01 DIAGNOSIS — R079 Chest pain, unspecified: Secondary | ICD-10-CM | POA: Diagnosis not present

## 2019-08-03 DIAGNOSIS — K1379 Other lesions of oral mucosa: Secondary | ICD-10-CM | POA: Diagnosis not present

## 2019-08-03 DIAGNOSIS — I700 Atherosclerosis of aorta: Secondary | ICD-10-CM | POA: Diagnosis not present

## 2019-08-03 DIAGNOSIS — Z85118 Personal history of other malignant neoplasm of bronchus and lung: Secondary | ICD-10-CM | POA: Diagnosis not present

## 2019-08-03 DIAGNOSIS — J449 Chronic obstructive pulmonary disease, unspecified: Secondary | ICD-10-CM | POA: Diagnosis not present

## 2019-08-11 DIAGNOSIS — J329 Chronic sinusitis, unspecified: Secondary | ICD-10-CM | POA: Diagnosis not present

## 2019-08-11 DIAGNOSIS — R0982 Postnasal drip: Secondary | ICD-10-CM | POA: Diagnosis not present

## 2019-08-24 DIAGNOSIS — R490 Dysphonia: Secondary | ICD-10-CM | POA: Diagnosis not present

## 2019-08-24 DIAGNOSIS — R0981 Nasal congestion: Secondary | ICD-10-CM | POA: Diagnosis not present

## 2019-08-29 ENCOUNTER — Encounter: Payer: Self-pay | Admitting: Neurology

## 2019-08-29 ENCOUNTER — Ambulatory Visit (INDEPENDENT_AMBULATORY_CARE_PROVIDER_SITE_OTHER): Payer: Medicare Other | Admitting: Neurology

## 2019-08-29 ENCOUNTER — Other Ambulatory Visit: Payer: Self-pay

## 2019-08-29 VITALS — BP 150/80 | HR 86 | Temp 97.0°F | Ht 65.5 in | Wt 164.3 lb

## 2019-08-29 DIAGNOSIS — M47812 Spondylosis without myelopathy or radiculopathy, cervical region: Secondary | ICD-10-CM

## 2019-08-29 DIAGNOSIS — H5319 Other subjective visual disturbances: Secondary | ICD-10-CM

## 2019-08-29 DIAGNOSIS — M542 Cervicalgia: Secondary | ICD-10-CM

## 2019-08-29 DIAGNOSIS — G4486 Cervicogenic headache: Secondary | ICD-10-CM

## 2019-08-29 DIAGNOSIS — R202 Paresthesia of skin: Secondary | ICD-10-CM | POA: Diagnosis not present

## 2019-08-29 DIAGNOSIS — R519 Headache, unspecified: Secondary | ICD-10-CM | POA: Diagnosis not present

## 2019-08-29 DIAGNOSIS — G8929 Other chronic pain: Secondary | ICD-10-CM | POA: Diagnosis not present

## 2019-08-29 DIAGNOSIS — G43119 Migraine with aura, intractable, without status migrainosus: Secondary | ICD-10-CM

## 2019-08-29 MED ORDER — GABAPENTIN 100 MG PO CAPS: 100.0000 mg | ORAL_CAPSULE | Freq: Every day | ORAL | 3 refills | Status: DC

## 2019-08-29 NOTE — Patient Instructions (Signed)
You may have a migraine like headache and migraine symptoms with visual disturbances and distortions.  Please make sure that your eye doctor has done a full, dilated eye examination to make sure there is no eye related reason for you to have blurry vision and visual distortions.  We will proceed with a brain MRI with and without contrast.  You also have a longstanding history of neck pain with some pain radiating upwards in the back of your head.  You may have a combination type headache.  You also may have stress related headaches.  Please try to hydrate well with water, 6 to 8 cups of water per day are recommended.  Please try to get enough rest, about 7 to 8 hours of sleep are recommended.  Please limit your caffeine to 2 servings per day if possible.  We will also proceed with a neck MRI with and without contrast.  You may benefit from seeing a spine specialist.  As discussed, your neurological exam is benign.  Nevertheless, for your intermittent headache and facial numbness and tingling we can try low-dose Neurontin/gabapentin. Start Neurontin (gabapentin) 100 mg strength: Take 1 pill nightly at bedtime. We can increase this if needed. The most common side effects reported are sedation or sleepiness. Rare side effects include balance problems, confusion.   We will draw blood today to make sure your kidney and liver functions are okay to proceed with these MRIs.  We will call you with the MRI results and follow-up in about 3 months.  Please see one of our nurse practitioners.

## 2019-08-29 NOTE — Progress Notes (Signed)
Subjective:    Patient ID: Bailey Nguyen is a 74 y.o. female.  HPI     Star Age, MD, PhD [facility] [address] [place], [place] [address]  Dear Dr. Lisbeth Ply,  I saw your patient, Bailey Nguyen, upon your kind request in my neurologic clinic today for initial consultation of her facial paresthesias.  The patient is unaccompanied today.  As you know, Bailey Nguyen is a 74 year old right-handed woman with an underlying medical history of lung cancer, glaucoma, reflux disease, COPD, smoking, chronic constipation, anxiety, osteoporosis, hyperlipidemia, TIA, Lyme disease, and borderline overweight state, who developed left lower face paresthesias and visual blurring in December 2020.  She reports that she has had lower facial tingling, particularly on the left side with some numbness around the face and lips for probably  longer than December 2020.  Symptoms are intermittent, may last a few minutes to a few hours.  The longest lasting symptoms made her go to the emergency room at [facility].  She denies any one-sided weakness or numbness in the hemibody.  She has a longstanding history of neck pain and sometimes the pain radiates up her neck and into the back of her head.  Recently, she had a severe right-sided headache.  She reports a history of migraines and visual auras preceding her migraines when she was much younger.  This was when she was in her 43s and 30s most likely and she has not had any significant migraines until recently.  She does endorse as a trigger possible stress and sleep deprivation.  She does not sleep very well.  She does not snore or wake up gasping or has pauses in her breathing.  She does not keep a very set schedule.  She may go to bed around 2 or 3 AM and sleep till 9 AM.  Sometimes she tosses and turns all night.  She tries to hydrate well with water, drinks caffeine in the form of coffee about 3 to 4 cups/day.  She does not  drink any alcohol and quit smoking over a year ago. She reports intermittent blurry vision which actually improved after her eye doctor started her on Restasis.  She reports visual distortions, including wavy lines, similar to her migraine auras but she does not always have a headache with it.  Her symptoms of facial paresthesias and numbness may happen every other day, she is not sure about the length or frequency of her symptoms in general.  It seems like her neck pain gets worse when she is driving or working at the computer.  She did have a neck MRI in 2019 which did show severe degenerative changes.  She reports that she has not seen a spine specialist.  She did have lower back surgery some years ago and has stable numbness in her left leg in the distal lateral portion including the lateral aspect of her left foot.  I reviewed the office note from 05/08/2019.  She presented to the emergency room on 05/02/2019 after an ophthalmology appointment which did not include a dilated eye exam.  She had a routine check on her glaucoma at the time.  She had a CT head without contrast on 05/02/2019 and I reviewed the results: Impression: White matter hypodensity primarily in the parietal lobes probably from chronic ischemic microvascular white matter disease, although if the patient has seizures and her hypertension and the possibility of posterior reversible encephalopathy syndrome might be considered.  Atherosclerosis.  Otherwise  unremarkable. I reviewed the emergency room records from 05/02/2019.  She declined a CT angiogram and a neurology consultation.  She was advised to start taking a baby aspirin and increase her Crestor to 40 mg daily.  She did have a subsequent she did have a subsequent CT angiogram of the head and neck with and without contrast as an outpatient only 05/03/2019 and I reviewed the results:   No intracranial arterial occlusion or high-grade stenosis. 2. No cervical carotid stenosis. 3.  Aortic Atherosclerosis (ICD10-I70.0) and Emphysema (ICD10-J43.9). Her Past Medical History Is Significant For: Past Medical History:  Diagnosis Date  . Anxiety   . Back ache   . Chest wall pain   . Chronic constipation   . COPD (chronic obstructive pulmonary disease) ([place])   . Coronary artery calcification seen on CT scan 01/01/2016  . Diverticula of intestine    [place]  . Dyskinesia of esophagus   . GERD (gastroesophageal reflux disease)   . Glaucoma   . Hx of colonic polyps   . Hx of Lyme disease   . Hx of TIA (transient ischemic attack) and stroke   . Hypercholesteremia   . Lung cancer ([place])    Removed upper right portion of lung   . Neck pain   . Osteoporosis   . Prediabetes   . Splenic infarct   . Tobacco use disorder     Her Past Surgical History Is Significant For: Past Surgical History:  Procedure Laterality Date  . BACK SURGERY    . CHOLECYSTECTOMY    . COLONOSCOPY  09/22/2013   Moderate predominantly sigmoid diverticulosis. Moderate internal hemorrhoids. Otherwise normal colonoscopy. Limited due to qualityt of prep/.  . ESOPHAGOGASTRODUODENOSCOPY  03/05/2017   Peptic esophageal stricture. Gastritis.   Marland Kitchen LUNG REMOVAL, PARTIAL Right 01/2015  . TUBAL LIGATION      Her Family History Is Significant For: Family History  Problem Relation Age of Onset  . Diabetes Sister   . Diabetes Brother   . Lung cancer Brother   . Diabetes Father   . Pancreatic cancer Father   . Coronary artery disease Mother 52  . Thyroid cancer Daughter   . Colon cancer Neg Hx   . Esophageal cancer Neg Hx   . Stomach cancer Neg Hx   . Rectal cancer Neg Hx     Her Social History Is Significant For: Social History   Socioeconomic History  . Marital status: Married    Spouse name: Not on file  . Number of children: 5  . Years of education: Not on file  . Highest education level: Not on file  Occupational History  . Not on file  Tobacco Use  . Smoking status: Former Smoker     Packs/day: 1.00    Types: Cigarettes    Quit date: 02/01/2015    Years since quitting: 4.5  . Smokeless tobacco: Never Used  Substance and Sexual Activity  . Alcohol use: No    Alcohol/week: 0.0 standard drinks  . Drug use: No  . Sexual activity: Not on file  Other Topics Concern  . Not on file  Social History Narrative  . Not on file   Social Determinants of Health   Financial Resource Strain:   . Difficulty of Paying Living Expenses:   Food Insecurity:   . Worried About Charity fundraiser in the Last Year:   . Arboriculturist in the Last Year:   Transportation Needs:   . Film/video editor (Medical):   Marland Kitchen  Lack of Transportation (Non-Medical):   Physical Activity:   . Days of Exercise per Week:   . Minutes of Exercise per Session:   Stress:   . Feeling of Stress :   Social Connections:   . Frequency of Communication with Friends and Family:   . Frequency of Social Gatherings with Friends and Family:   . Attends Religious Services:   . Active Member of Clubs or Organizations:   . Attends Archivist Meetings:   Marland Kitchen Marital Status:     Her Allergies Are:  No Known Allergies:   Her Current Medications Are:  Outpatient Encounter Medications as of 08/29/2019  Medication Sig  . ALBUTEROL IN Inhale into the lungs as needed.  Marland Kitchen aspirin 325 MG tablet Take by mouth as needed.  . brinzolamide (AZOPT) 1 % ophthalmic suspension Place 1 drop into the right eye 2 (two) times daily.  . cyclobenzaprine (FLEXERIL) 5 MG tablet Take 5 mg by mouth 3 (three) times daily as needed. (back muscle pain)  . cycloSPORINE (RESTASIS OP) Apply to eye. 2x day every 12 hours.  . Fluticasone-Umeclidin-Vilant (TRELEGY ELLIPTA) 100-62.5-25 MCG/INH AEPB Inhale 1 puff into the lungs daily.  Marland Kitchen ibuprofen (ADVIL,MOTRIN) 800 MG tablet Take 800 mg by mouth as needed.  Marland Kitchen ipratropium-albuterol (DUONEB) 0.5-2.5 (3) MG/3ML SOLN Take 3 mLs by nebulization.  Marland Kitchen nystatin (MYCOSTATIN) 100000 UNIT/ML  suspension as needed.  . pantoprazole (PROTONIX) 40 MG tablet Take 1 tablet (40 mg total) by mouth 2 (two) times daily.  . polyethylene glycol (MIRALAX / GLYCOLAX) packet Take 17 g by mouth daily as needed for constipation.  . rosuvastatin (CRESTOR) 40 MG tablet Take 40 mg by mouth daily.  Marland Kitchen triamcinolone cream (KENALOG) 0.5 % Apply 1 application topically 3 (three) times daily.  Marland Kitchen gabapentin (NEURONTIN) 100 MG capsule Take 1 capsule (100 mg total) by mouth at bedtime.   No facility-administered encounter medications on file as of 08/29/2019.  :   Review of Systems:  Out of a complete 14 point review of systems, all are reviewed and negative with the exception of these symptoms as listed below:  Review of Systems  Neurological:       Pt reports she is here to discuss numbness in her face, pain in her upper neck and burry vision. Pt reports sx have been stable since a week ago Monday.    Objective:  Neurological Exam  Physical Exam Physical Examination:   Vitals:   08/29/19 0816  BP: (!) 150/80  Pulse: 86  Temp: (!) 97 F (36.1 C)  SpO2: 97%   General Examination: The patient is a very pleasant 74 y.o. female in no acute distress. She appears well-developed and well-nourished and well groomed.   HEENT: Normocephalic, atraumatic, pupils are equal, round and reactive to light and accommodation. Funduscopic exam is normal with sharp disc margins noted. S/p cataract extractions. Extraocular tracking is good without limitation to gaze excursion or nystagmus noted. Normal smooth pursuit is noted. Visual fields are full by finger perimetry.  Hearing is grossly intact. Tympanic membranes are clear bilaterally. Face is symmetric with normal facial animation and normal facial sensation. Speech is clear with no dysarthria noted. There is no hypophonia. There is no lip, neck/head, jaw or voice tremor. Neck is supple with full range of passive and active motion. There are no carotid bruits on  auscultation. Oropharynx exam reveals: moderate mouth dryness, adequate dental hygiene. Tongue protrudes centrally and palate elevates symmetrically. Some crepitation noted with neck motion.  Chest: Clear to auscultation but intermittent exp wheezing noted.  Heart: S1+S2+0, regular and normal without murmurs, rubs or gallops noted.   Abdomen: Soft, non-tender and non-distended with normal bowel sounds appreciated on auscultation.  Extremities: There is no pitting edema in the distal lower extremities bilaterally. Pedal pulses are intact.  Skin: Warm and dry without trophic changes noted.  Musculoskeletal: exam reveals no obvious joint deformities, L toe #4 with callus like lesion.   Neurologically:  Mental status: The patient is awake, alert and oriented in all 4 spheres. Her immediate and remote memory, attention, language skills and fund of knowledge are appropriate. There is no evidence of aphasia, agnosia, apraxia or anomia. Speech is clear with normal prosody and enunciation. Thought process is linear. Mood is normal and affect is normal.  Cranial nerves II - XII are as described above under HEENT exam. In addition: shoulder shrug is normal with equal shoulder height noted. Motor exam: Normal bulk, strength and tone is noted. There is no drift, tremor or rebound. Reflexes are 1+ throughout. Babinski: Toes are flexor bilaterally. Fine motor skills and coordination: intact with normal finger taps, normal hand movements, normal rapid alternating patting, normal foot taps and normal foot agility.  Cerebellar testing: No dysmetria or intention tremor on finger to nose testing. Heel to shin is unremarkable bilaterally. There is no truncal or gait ataxia.  Sensory exam: intact to light touch, pinprick, vibration, temperature sense with the exception of decreased sensation to pinprick and temperature in the lateral aspect of the distal left lower extremity, from kneeling down to the lateral aspect  of the foot, not new per patient.    Gait, station and balance: She stands easily. No veering to one side is noted. No leaning to one side is noted. Posture is age-appropriate and stance is narrow based. Gait shows normal stride length and normal pace. No problems turning are noted.   Assessment and plan:  Assessment and Plan:  In summary, Bailey Nguyen is a very pleasant 74 y.o.-year old female with an underlying medical history of lung cancer, glaucoma, reflux disease, COPD, prior smoking, chronic constipation, anxiety, osteoporosis, hyperlipidemia, TIA, Lyme disease, and borderline overweight state, who presents for evaluation of various symptoms including paresthesias intermittently affecting the lower facial area bilaterally, probably worse on the left side per her report, intermittent neck discomfort and intermittent headaches, intermittent visual blurriness and visual distortions such as migrainous auras or ocular migraines even.  She does have intermittent headaches which could be a combination including tension headaches, migraine headaches, and cervicogenic headaches.  Triggers may be stress, sleep deprivation, fluctuating caffeine intake and possible suboptimal hydration.  She reports that she has not had any water today and her mouth was quite dry.  She reports that her blurry vision has improved since she has been placed on Restasis eyedrops.  Nevertheless she still has these visual auras/visual distortions.  She is advised to make sure that she has an formal dilated eye examination with her eye doctor.  She has a history of degenerative cervical spine disease but reports she has not seen a spine specialist.  She may benefit from seeing a spine specialist such as orthopedic surgeon or neurosurgeon.  She is encouraged to talk to you about this.  For her facial paresthesias and intermittent headaches I suggested a trial of low-dose gabapentin.  I talked to her about expectations and potential side  effects.  Amitriptyline would be a less good choice as it can cause blurry vision and  also mouth dryness.  She is encouraged to stay better hydrated with water, well rested, and limit her caffeine if possible to up to 2 servings per day.  Her neurological exam is nonfocal thankfully.  She has stable numbness in her left leg.  She is advised to proceed with a brain MRI with and without contrast to rule out a structural cause of her symptoms as well as a neck MRI with and without contrast to reevaluate her degenerative cervical spine disease.  She is advised to follow-up routinely to see one of our nurse practitioners in 3 months, sooner if needed.  We will keep her posted as to her MRI results in the interim by phone call.  I answered all her questions today and she was in agreement.    Thank you very much for allowing me to participate in the care of this nice patient. If I can be of any further assistance to you please do not hesitate to call me at [phone].  Sincerely,   Star Age, MD, PhD

## 2019-08-30 DIAGNOSIS — E785 Hyperlipidemia, unspecified: Secondary | ICD-10-CM | POA: Diagnosis not present

## 2019-08-30 DIAGNOSIS — R079 Chest pain, unspecified: Secondary | ICD-10-CM | POA: Diagnosis not present

## 2019-08-30 DIAGNOSIS — R0789 Other chest pain: Secondary | ICD-10-CM | POA: Diagnosis not present

## 2019-08-30 LAB — COMPREHENSIVE METABOLIC PANEL
ALT: 13 IU/L (ref 0–32)
AST: 13 IU/L (ref 0–40)
Albumin/Globulin Ratio: 1.6 (ref 1.2–2.2)
Albumin: 4.6 g/dL (ref 3.7–4.7)
Alkaline Phosphatase: 88 IU/L (ref 39–117)
BUN/Creatinine Ratio: 15 (ref 12–28)
BUN: 14 mg/dL (ref 8–27)
Bilirubin Total: 0.2 mg/dL (ref 0.0–1.2)
CO2: 22 mmol/L (ref 20–29)
Calcium: 9.6 mg/dL (ref 8.7–10.3)
Chloride: 105 mmol/L (ref 96–106)
Creatinine, Ser: 0.96 mg/dL (ref 0.57–1.00)
GFR calc Af Amer: 68 mL/min/{1.73_m2} (ref >59)
GFR calc non Af Amer: 59 mL/min/{1.73_m2} — ABNORMAL LOW (ref >59)
Globulin, Total: 2.8 g/dL (ref 1.5–4.5)
Glucose: 94 mg/dL (ref 65–99)
Potassium: 4.4 mmol/L (ref 3.5–5.2)
Sodium: 142 mmol/L (ref 134–144)
Total Protein: 7.4 g/dL (ref 6.0–8.5)

## 2019-08-31 ENCOUNTER — Telehealth: Payer: Self-pay | Admitting: Neurology

## 2019-08-31 NOTE — Telephone Encounter (Signed)
Medicare/[place] no auth order faxed to [facility] hospital they will reach out to the patient to schedule. Phone number [phone] & fax # [phone]. They will reach out to the patient to schedule.

## 2019-09-19 ENCOUNTER — Other Ambulatory Visit: Payer: Self-pay | Admitting: Gastroenterology

## 2019-09-19 DIAGNOSIS — R062 Wheezing: Secondary | ICD-10-CM | POA: Diagnosis not present

## 2019-09-19 DIAGNOSIS — Z9049 Acquired absence of other specified parts of digestive tract: Secondary | ICD-10-CM | POA: Diagnosis not present

## 2019-09-19 DIAGNOSIS — K219 Gastro-esophageal reflux disease without esophagitis: Secondary | ICD-10-CM | POA: Diagnosis not present

## 2019-09-19 DIAGNOSIS — B348 Other viral infections of unspecified site: Secondary | ICD-10-CM | POA: Diagnosis not present

## 2019-09-19 DIAGNOSIS — Z87891 Personal history of nicotine dependence: Secondary | ICD-10-CM | POA: Diagnosis not present

## 2019-09-19 DIAGNOSIS — J441 Chronic obstructive pulmonary disease with (acute) exacerbation: Secondary | ICD-10-CM | POA: Diagnosis not present

## 2019-09-19 DIAGNOSIS — Z20822 Contact with and (suspected) exposure to covid-19: Secondary | ICD-10-CM | POA: Diagnosis not present

## 2019-09-19 DIAGNOSIS — I2510 Atherosclerotic heart disease of native coronary artery without angina pectoris: Secondary | ICD-10-CM | POA: Diagnosis not present

## 2019-09-19 DIAGNOSIS — R05 Cough: Secondary | ICD-10-CM | POA: Diagnosis not present

## 2019-09-19 DIAGNOSIS — R0602 Shortness of breath: Secondary | ICD-10-CM | POA: Diagnosis not present

## 2019-09-19 DIAGNOSIS — R1013 Epigastric pain: Secondary | ICD-10-CM

## 2019-09-19 DIAGNOSIS — R1310 Dysphagia, unspecified: Secondary | ICD-10-CM

## 2019-09-27 DIAGNOSIS — J449 Chronic obstructive pulmonary disease, unspecified: Secondary | ICD-10-CM | POA: Diagnosis not present

## 2019-10-03 ENCOUNTER — Telehealth: Payer: Self-pay | Admitting: Gastroenterology

## 2019-10-04 NOTE — Telephone Encounter (Signed)
Patient called to follow up on previous message

## 2019-10-05 NOTE — Telephone Encounter (Signed)
LMOM for patient to call back.

## 2019-10-17 DIAGNOSIS — H401132 Primary open-angle glaucoma, bilateral, moderate stage: Secondary | ICD-10-CM | POA: Diagnosis not present

## 2019-10-19 DIAGNOSIS — R202 Paresthesia of skin: Secondary | ICD-10-CM | POA: Diagnosis not present

## 2019-10-19 DIAGNOSIS — Z6826 Body mass index (BMI) 26.0-26.9, adult: Secondary | ICD-10-CM | POA: Diagnosis not present

## 2019-11-02 ENCOUNTER — Ambulatory Visit: Payer: Medicare Other | Admitting: Gastroenterology

## 2019-11-09 ENCOUNTER — Telehealth: Payer: Self-pay | Admitting: Neurology

## 2019-11-09 NOTE — Telephone Encounter (Signed)
Please call patient and advise her that her neck MRI showed significant multilevel degenerative changes, no acute changes, no herniated disc.   Her brain MRI showed no acute findings and no explanation for her facial numbness and tingling.  I recommend that she make a follow-up appointment to see her spine specialist for ongoing neck pain.   The patient had a cervical spine MRI with and without contrast as well as a brain MRI with and without contrast through [facility] in [place] on 11/07/2019 and I reviewed the results:  IMPRESSION:  1. No acute osseous abnormality in the cervical spine.  2. Widespread cervical disc and endplate degeneration. Mild spinal  stenosis at C4-C5 and C5-C6, with up to mild cord mass effect but no  cord signal abnormality.  3. Moderate or severe degenerative neural foraminal stenosis at the  left C4, bilateral C5, bilateral C6 and left C7 nerve levels.    IMPRESSION:  No evidence of recent infarction, hemorrhage, mass, or abnormal  enhancement.   Mild to moderate chronic microvascular ischemic changes.

## 2019-11-14 NOTE — Telephone Encounter (Signed)
I contacted the pt and advised of result. Pt verbalized understanding and had no questions/concerns.

## 2019-11-30 ENCOUNTER — Ambulatory Visit: Payer: Medicare Other | Admitting: Family Medicine

## 2019-12-20 DIAGNOSIS — J449 Chronic obstructive pulmonary disease, unspecified: Secondary | ICD-10-CM | POA: Diagnosis not present

## 2019-12-20 DIAGNOSIS — C3432 Malignant neoplasm of lower lobe, left bronchus or lung: Secondary | ICD-10-CM | POA: Diagnosis not present

## 2019-12-22 DIAGNOSIS — J984 Other disorders of lung: Secondary | ICD-10-CM | POA: Diagnosis not present

## 2019-12-22 DIAGNOSIS — I2510 Atherosclerotic heart disease of native coronary artery without angina pectoris: Secondary | ICD-10-CM | POA: Diagnosis not present

## 2019-12-22 DIAGNOSIS — I700 Atherosclerosis of aorta: Secondary | ICD-10-CM | POA: Diagnosis not present

## 2019-12-22 DIAGNOSIS — Z85118 Personal history of other malignant neoplasm of bronchus and lung: Secondary | ICD-10-CM | POA: Diagnosis not present

## 2019-12-22 DIAGNOSIS — J439 Emphysema, unspecified: Secondary | ICD-10-CM | POA: Diagnosis not present

## 2020-01-11 DIAGNOSIS — C3490 Malignant neoplasm of unspecified part of unspecified bronchus or lung: Secondary | ICD-10-CM | POA: Diagnosis not present

## 2020-01-11 DIAGNOSIS — J449 Chronic obstructive pulmonary disease, unspecified: Secondary | ICD-10-CM | POA: Diagnosis not present

## 2020-01-11 DIAGNOSIS — J432 Centrilobular emphysema: Secondary | ICD-10-CM | POA: Diagnosis not present

## 2020-01-11 DIAGNOSIS — Z87891 Personal history of nicotine dependence: Secondary | ICD-10-CM | POA: Diagnosis not present

## 2020-02-21 DIAGNOSIS — Z9049 Acquired absence of other specified parts of digestive tract: Secondary | ICD-10-CM | POA: Diagnosis not present

## 2020-02-21 DIAGNOSIS — Z79899 Other long term (current) drug therapy: Secondary | ICD-10-CM | POA: Diagnosis not present

## 2020-02-21 DIAGNOSIS — Z87891 Personal history of nicotine dependence: Secondary | ICD-10-CM | POA: Diagnosis not present

## 2020-02-21 DIAGNOSIS — I2510 Atherosclerotic heart disease of native coronary artery without angina pectoris: Secondary | ICD-10-CM | POA: Diagnosis not present

## 2020-02-21 DIAGNOSIS — J449 Chronic obstructive pulmonary disease, unspecified: Secondary | ICD-10-CM | POA: Diagnosis not present

## 2020-02-21 DIAGNOSIS — L509 Urticaria, unspecified: Secondary | ICD-10-CM | POA: Diagnosis not present

## 2020-02-21 DIAGNOSIS — R200 Anesthesia of skin: Secondary | ICD-10-CM | POA: Diagnosis not present

## 2020-02-28 ENCOUNTER — Ambulatory Visit (INDEPENDENT_AMBULATORY_CARE_PROVIDER_SITE_OTHER): Payer: Medicare Other | Admitting: Gastroenterology

## 2020-02-28 ENCOUNTER — Encounter: Payer: Self-pay | Admitting: Gastroenterology

## 2020-02-28 VITALS — BP 130/78 | HR 68 | Ht 65.5 in | Wt 162.0 lb

## 2020-02-28 DIAGNOSIS — R1013 Epigastric pain: Secondary | ICD-10-CM | POA: Diagnosis not present

## 2020-02-28 DIAGNOSIS — K219 Gastro-esophageal reflux disease without esophagitis: Secondary | ICD-10-CM | POA: Diagnosis not present

## 2020-02-28 MED ORDER — AMBULATORY NON FORMULARY MEDICATION: 1 refills | Status: DC

## 2020-02-28 NOTE — Patient Instructions (Signed)
If you are age 72 or older, your body mass index should be between 23-30. Your Body mass index is 26.55 kg/m. If this is out of the aforementioned range listed, please consider follow up with your Primary Care Provider.  If you are age 89 or younger, your body mass index should be between 19-25. Your Body mass index is 26.55 kg/m. If this is out of the aformentioned range listed, please consider follow up with your Primary Care Provider.   Your provider has ordered Cologuard testing as an option for colon cancer screening. This is performed by [employer] and may be out of network with your insurance. PRIOR to completing the test, it is YOUR responsibility to contact your insurance about covered benefits for this test. Your out of pocket expense could be anywhere from $0.00 to $649.00.   When you call to check coverage with your insurer, please provide the following information:   -The ONLY provider of Cologuard is [place] code for Cologuard is [phone].  [employer] NPI # [id number]  -[employer] Tax ID # [id number]   We have already sent your demographic and insurance information to [employer] (phone number [phone]) and they should contact you within the next week regarding your test. If you have not heard from them within the next week, please call our office at [phone].   We have sent the following medications to your pharmacy for you to pick up at your convenience: GI Cocktail   You have been scheduled for an endoscopy. Please follow written instructions given to you at your visit today. If you use inhalers (even only as needed), please bring them with you on the day of your procedure.  Thank you,  Dr. Jackquline Denmark

## 2020-02-28 NOTE — Progress Notes (Signed)
Chief Complaint: FU  Referring Provider:  Leonides Sake, MD      ASSESSMENT AND PLAN;   #1.  Atypical chest pains/epi pain - neg CT abd/pelvis 12/2017, neg EGD 05/2018. Neg [place] 06/2019. Neg cardiac eval 07/2019. #2. GERD with small HH dysphagia.  Resolved after EGD with dil 05/2018.  Now with recurrence. H/O oral thrush after inhalers s/p treatment with nystatin. #3. Associated COPD, 3V CAD, chronic neck and low back pain, HLD, anxiety. #4. H/O Lung AdenoCa s/p RUL lobectomy 01/2015, another lung adeno CA  LLL lobe s/p wedge resection 07/2018. Neg CT chest for recurrence 12/2019.  Plan: - Protonix 40mg  po QD.  - EGD with dil  - Colguard test - If with atypical chest pains, GI coctail.15 cc po tid prn, #120  Addendum  barium swallow on 04/28/2018 at [facility] hiatal hernia, reflux to upper esophagus which empties slowly, occasional tertiary contractions.  13 mm tablet passed freely.  No masses or ulcerations. HPI:    Bailey Nguyen is a 74 y.o. female  Follow-up With H/O COPD, Ca lung   Has hives s/p epi shot. Has had hives since age 82   Problems swallowing, mostly solids-meats and breads.  She did well after previous dilatation.  She would like to have another EGD with dilation. Has oral thrush after inhalers, S/p course of nystatin.  Has been having noncardiac atypical chest pains, mostly in the lower chest.  She had negative cardiac evaluation including 2D echo and nuclear medicine cardiac stress test March 2021.  Continues to have occasional heartburn but better since she has been on Protonix.  She takes it mostly once a day.  At times increases it to twice daily.  Most recently had negative CT chest for recurrence.  Would like to avoid colonoscopy and wants to get Cologuard screening test.  No weight loss or loss of appetite.  Denies having any significant diarrhea or constipation.   From previous records: -dysphagia mostly to solids, no problems with  liquids, mid chest. S/P EGDs with dil in 2013, 2016 and 2018, 2020 with good relief Had colonoscopy 09/2013-limited due to preparation, grossly negative except for moderate sigmoid diverticulosis and internal hemorrhoids. Had epigastric pain in August 2019, went to [facility], underwent CT scan of the abdomen pelvis which was unremarkable.  Does not want repeat colonoscopy.  CT Chest 04/2019 1. No evidence of local tumor recurrence status post right upper lobectomy and medial left lower lobe wedge resection. 2. No findings of metastatic disease in the chest. 3. Three-vessel coronary atherosclerosis.  PET 06/2018 Abdomen/Pelvis: - Liver: Multiple subcentimeter hepatic hypodensities are unchanged from 2016 activity and exhibit no FDG uptake. Scattered parenchymal calcifications are noted. - Gallbladder: Surgically absent. - Spleen: No splenomegaly. No focal abnormalities. - Pancreas: No focal abnormality - Adrenal glands: Unremarkable - Kidneys: Unremarkable - GI Tract: Colonic diverticulosis. - GU Tract: Unremarkable - Adenopathy: None  Past Medical History:  Diagnosis Date  . Anxiety   . Back ache   . Chest wall pain   . Chronic constipation   . COPD (chronic obstructive pulmonary disease) ([place])   . Coronary artery calcification seen on CT scan 01/01/2016  . Diverticula of intestine    [place]  . Dyskinesia of esophagus   . GERD (gastroesophageal reflux disease)   . Glaucoma   . Hx of colonic polyps   . Hx of Lyme disease   . Hx of TIA (transient ischemic attack) and stroke   . Hypercholesteremia   .  Lung cancer ([place])    Removed upper right portion of lung   . Neck pain   . Osteoporosis   . Prediabetes   . Splenic infarct   . Tobacco use disorder     Past Surgical History:  Procedure Laterality Date  . BACK SURGERY    . CHOLECYSTECTOMY    . COLONOSCOPY  09/22/2013   Moderate predominantly sigmoid diverticulosis. Moderate internal hemorrhoids. Otherwise normal  colonoscopy. Limited due to qualityt of prep/.  . ESOPHAGOGASTRODUODENOSCOPY  03/05/2017   Peptic esophageal stricture. Gastritis.   Marland Kitchen LUNG REMOVAL, PARTIAL Right 01/2015  . LUNG SURGERY Left 07/24/2018  . TUBAL LIGATION      Family History  Problem Relation Age of Onset  . Diabetes Sister   . Diabetes Brother   . Lung cancer Brother   . Diabetes Father   . Pancreatic cancer Father   . Coronary artery disease Mother 81  . Thyroid cancer Daughter   . Colon cancer Neg Hx   . Esophageal cancer Neg Hx   . Stomach cancer Neg Hx   . Rectal cancer Neg Hx     Social History   Tobacco Use  . Smoking status: Former Smoker    Packs/day: 1.00    Types: Cigarettes    Quit date: 02/01/2015    Years since quitting: 5.0  . Smokeless tobacco: Never Used  Vaping Use  . Vaping Use: Former  . Devices: Tried it before for a week  Substance Use Topics  . Alcohol use: No    Alcohol/week: 0.0 standard drinks  . Drug use: No    Current Outpatient Medications  Medication Sig Dispense Refill  . ALBUTEROL IN Inhale into the lungs as needed.    Marland Kitchen aspirin 325 MG tablet Take by mouth as needed.    . brinzolamide (AZOPT) 1 % ophthalmic suspension Place 1 drop into the right eye 2 (two) times daily.    . Budeson-Glycopyrrol-Formoterol (BREZTRI AEROSPHERE) 160-9-4.8 MCG/ACT AERO Inhale 2 puffs into the lungs 2 (two) times daily.     . cyclobenzaprine (FLEXERIL) 5 MG tablet Take 5 mg by mouth 3 (three) times daily as needed. (back muscle pain)  1  . cycloSPORINE (RESTASIS OP) Apply to eye. 2x day every 12 hours.    . gabapentin (NEURONTIN) 100 MG capsule Take 1 capsule (100 mg total) by mouth at bedtime. 30 capsule 3  . ibuprofen (ADVIL,MOTRIN) 800 MG tablet Take 800 mg by mouth as needed.    Marland Kitchen ipratropium-albuterol (DUONEB) 0.5-2.5 (3) MG/3ML SOLN Take 3 mLs by nebulization.    Marland Kitchen nystatin (MYCOSTATIN) 100000 UNIT/ML suspension as needed.    . pantoprazole (PROTONIX) 40 MG tablet Take 1 tablet (40  mg total) by mouth 2 (two) times daily. 60 tablet 6  . polyethylene glycol (MIRALAX / GLYCOLAX) packet Take 17 g by mouth daily as needed for constipation.    . rosuvastatin (CRESTOR) 40 MG tablet Take 40 mg by mouth daily.    Marland Kitchen triamcinolone cream (KENALOG) 0.5 % Apply 1 application topically 3 (three) times daily.    Marland Kitchen EPINEPHrine 0.3 mg/0.3 mL IJ SOAJ injection as needed. (Patient not taking: Reported on 02/28/2020)     No current facility-administered medications for this visit.    No Known Allergies  Review of Systems:  Negative except for HPI.     Physical Exam:    BP 130/78   Pulse 68   Ht 5' 5.5" (1.664 m)   Wt 162 lb (73.5 kg)  BMI 26.55 kg/m  Filed Weights   02/28/20 1117  Weight: 162 lb (73.5 kg)   Constitutional:  Well-developed, in no acute distress. Psychiatric: Normal mood and affect. Behavior is normal. HEENT: Pupils normal.  Conjunctivae are normal. No scleral icterus.  No thrush. Neck supple.  Cardiovascular: Normal rate, regular rhythm. No edema Pulmonary/chest: Bilateral decreased breath sounds.  No wheezing or rhonchi. Abdominal: Soft, nondistended. Nontender. Bowel sounds active throughout. There are no masses palpable. No hepatomegaly. Rectal:  defered Neurological: Alert and oriented to person place and time. Skin: Skin is warm and dry. No rashes noted.  Data Reviewed: I have personally reviewed following labs and imaging studies  CBC: CBC Latest Ref Rng & Units 07/02/2008 07/22/2007  WBC - - 6.3  Hemoglobin 12.0 - 15.0 g/dL 15.6(H) 14.5  Hematocrit 36 - 46 % 46.0 41.6  Platelets - - 277    CMP: CMP Latest Ref Rng & Units 08/29/2019 07/02/2008 07/22/2007  Glucose 65 - 99 mg/dL 94 102(H) 74  BUN 8 - 27 mg/dL 14 17 14   Creatinine 0.57 - 1.00 mg/dL 0.96 0.6 0.85  Sodium 134 - 144 mmol/L 142 142 141  Potassium 3.5 - 5.2 mmol/L 4.4 3.9 3.9  Chloride 96 - 106 mmol/L 105 110 107  CO2 20 - 29 mmol/L 22 - 26  Calcium 8.7 - 10.3 mg/dL 9.6 - 9.0   Total Protein 6.0 - 8.5 g/dL 7.4 - -  Total Bilirubin 0.0 - 1.2 mg/dL 0.2 - -  Alkaline Phos 39 - 117 IU/L 88 - -  AST 0 - 40 IU/L 13 - -  ALT 0 - 32 IU/L 13 - -  I spent 25 minutes of face-to-face time with the patient. Greater than 50% of the time was spent counseling and coordinating care.    Carmell Austria, MD 02/28/2020, 11:28 AM  Cc: Leonides Sake, MD

## 2020-03-05 ENCOUNTER — Other Ambulatory Visit: Payer: Self-pay | Admitting: Gastroenterology

## 2020-03-05 DIAGNOSIS — Z1159 Encounter for screening for other viral diseases: Secondary | ICD-10-CM | POA: Diagnosis not present

## 2020-03-05 LAB — SARS CORONAVIRUS 2 (TAT 6-24 HRS): SARS Coronavirus 2: NEGATIVE

## 2020-03-06 DIAGNOSIS — R682 Dry mouth, unspecified: Secondary | ICD-10-CM | POA: Diagnosis not present

## 2020-03-06 DIAGNOSIS — R0982 Postnasal drip: Secondary | ICD-10-CM | POA: Diagnosis not present

## 2020-03-06 DIAGNOSIS — R070 Pain in throat: Secondary | ICD-10-CM | POA: Diagnosis not present

## 2020-03-07 ENCOUNTER — Other Ambulatory Visit: Payer: Self-pay

## 2020-03-07 ENCOUNTER — Ambulatory Visit (AMBULATORY_SURGERY_CENTER): Payer: Medicare Other | Admitting: Gastroenterology

## 2020-03-07 ENCOUNTER — Encounter: Payer: Self-pay | Admitting: Gastroenterology

## 2020-03-07 VITALS — BP 123/72 | HR 68 | Temp 96.9°F | Resp 19 | Ht 65.5 in | Wt 162.0 lb

## 2020-03-07 DIAGNOSIS — R1013 Epigastric pain: Secondary | ICD-10-CM

## 2020-03-07 DIAGNOSIS — K219 Gastro-esophageal reflux disease without esophagitis: Secondary | ICD-10-CM | POA: Diagnosis not present

## 2020-03-07 MED ORDER — SODIUM CHLORIDE 0.9 % IV SOLN: 500.0000 mL | Freq: Once | INTRAVENOUS | Status: DC

## 2020-03-07 NOTE — Patient Instructions (Signed)
HANDOUTS PROVIDED ON: POST DILATION DIET  You may resume your previous medication schedule.  You should have only clear liquids until 11:15 am today and then progress to soft foods for the rest of today.  You may resume your regular diet tomorrow.  Thank you for allowing [place] to care for you today!!!   YOU HAD AN ENDOSCOPIC PROCEDURE TODAY AT [place]:   Refer to the procedure report that was given to you for any specific questions about what was found during the examination.  If the procedure report does not answer your questions, please call your gastroenterologist to clarify.  If you requested that your care partner not be given the details of your procedure findings, then the procedure report has been included in a sealed envelope for you to review at your convenience later.  YOU SHOULD EXPECT: Some feelings of bloating in the abdomen. Passage of more gas than usual.  Walking can help get rid of the air that was put into your GI tract during the procedure and reduce the bloating.   Please Note:  You might notice some irritation and congestion in your nose or some drainage.  This is from the oxygen used during your procedure.  There is no need for concern and it should clear up in a day or so.  SYMPTOMS TO REPORT IMMEDIATELY:   Following upper endoscopy (EGD)  Vomiting of blood or coffee ground material  New chest pain or pain under the shoulder blades  Painful or persistently difficult swallowing  New shortness of breath  Fever of 100F or higher  Black, tarry-looking stools  For urgent or emergent issues, a gastroenterologist can be reached at any hour by calling [phone]. Do not use MyChart messaging for urgent concerns.    DIET:  We do recommend a post dilation diet today and tomorrow small meal at first, but then you may proceed to your regular diet.  Drink plenty of fluids but you should avoid alcoholic beverages for 24 hours.  ACTIVITY:  You should  plan to take it easy for the rest of today and you should NOT DRIVE or use heavy machinery until tomorrow (because of the sedation medicines used during the test).    FOLLOW UP: Our staff will call the number listed on your records Monday morning between 7:15 am and 8:15 am to check on you and address any questions or concerns that you may have regarding the information given to you following your procedure. If we do not reach you, we will leave a message.  We will attempt to reach you two times.  During this call, we will ask if you have developed any symptoms of COVID 19. If you develop any symptoms (ie: fever, flu-like symptoms, shortness of breath, cough etc.) before then, please call [phone].  If you test positive for Covid 19 in the 2 weeks post procedure, please call and report this information to [place].    If any biopsies were taken you will be contacted by phone or by letter within the next 1-3 weeks.  Please call us at [phone] if you have not heard about the biopsies in 3 weeks.    SIGNATURES/CONFIDENTIALITY: You and/or your care partner have signed paperwork which will be entered into your electronic medical record.  These signatures attest to the fact that that the information above on your After Visit Summary has been reviewed and is understood.  Full responsibility of the confidentiality of this discharge information lies  with you and/or your care-partner.

## 2020-03-07 NOTE — Progress Notes (Addendum)
AR-Check-in  CW - VS    Per pt she is taking Pantoprazole 40 mg daily.  Not BID. Pt said Dr. Lyndel Safe recommended this change on her last visit. maw

## 2020-03-07 NOTE — Progress Notes (Signed)
A and O x3. Report to RN. Tolerated MAC anesthesia well.Teeth unchanged after procedure.

## 2020-03-07 NOTE — Progress Notes (Signed)
Called to room to assist during endoscopic procedure.  Patient ID and intended procedure confirmed with present staff. Received instructions for my participation in the procedure from the performing physician.  

## 2020-03-07 NOTE — Op Note (Signed)
[place] Patient Name: Bailey Nguyen Procedure Date: 03/07/2020 10:00 AM MRN: [id number] Endoscopist: Jackquline Denmark , MD Age: 74 Referring MD:  Date of Birth: 1946-05-11 Gender: Female Account #: [id number] Procedure:                Upper GI endoscopy Indications:              Dysphagia Medicines:                Monitored Anesthesia Care Procedure:                Pre-Anesthesia Assessment:                           - Prior to the procedure, a History and Physical                            was performed, and patient medications and                            allergies were reviewed. The patient's tolerance of                            previous anesthesia was also reviewed. The risks                            and benefits of the procedure and the sedation                            options and risks were discussed with the patient.                            All questions were answered, and informed consent                            was obtained. Prior Anticoagulants: The patient has                            taken no previous anticoagulant or antiplatelet                            agents. ASA Grade Assessment: III - A patient with                            severe systemic disease. After reviewing the risks                            and benefits, the patient was deemed in                            satisfactory condition to undergo the procedure.                           After obtaining informed consent, the endoscope was  passed under direct vision. Throughout the                            procedure, the patient's blood pressure, pulse, and                            oxygen saturations were monitored continuously. The                            Endoscope was introduced through the mouth, and                            advanced to the second part of duodenum. The upper                            GI endoscopy was accomplished without  difficulty.                            The patient tolerated the procedure well. Scope In: Scope Out: Findings:                 The examined esophagus was mildly tortuous. The                            scope was withdrawn. Dilation was performed with a                            Maloney dilator with mild resistance at 61 Fr and                            54 Fr. Bx were not performed since previous                            biopsies were negative for EoE                           The entire examined stomach was normal. Some                            retained food in the stomach without outlet                            obstruction.                           The examined duodenum was normal. Complications:            No immediate complications. Estimated Blood Loss:     Estimated blood loss: none. Impression:               - Presbyesophagus s/p esophageal dilatation.                           - No specimens collected. Recommendation:           - Patient has a contact number available for  emergencies. The signs and symptoms of potential                            delayed complications were discussed with the                            patient. Return to normal activities tomorrow.                            Written discharge instructions were provided to the                            patient.                           - Post dilatation diet.                           - Continue present medications.                           - I have instructed patient that she needs to chew                            her food well and eat slowly.                           - The findings and recommendations were discussed                            with the patient's family.                           - Please have Cologuard screening test approved and                            sent to her in the mail. Jackquline Denmark, MD 03/07/2020 10:16:21 AM This report has been signed  electronically.

## 2020-03-07 NOTE — Progress Notes (Signed)
robinol antisialogogue lidocaine buffer

## 2020-03-11 ENCOUNTER — Telehealth: Payer: Self-pay

## 2020-03-11 ENCOUNTER — Telehealth: Payer: Self-pay | Admitting: *Deleted

## 2020-03-11 NOTE — Telephone Encounter (Signed)
°  Follow up Call-  Call back number 03/07/2020 05/17/2018  Post procedure Call Back phone  # [phone] hm [phone]  Permission to leave phone message Yes Yes  Some recent data might be hidden    1st follow up call made.  NAULM

## 2020-03-11 NOTE — Telephone Encounter (Signed)
No answer for post procedure call back. Left message for patient to call with questions or concerns. 

## 2020-03-19 DIAGNOSIS — H401132 Primary open-angle glaucoma, bilateral, moderate stage: Secondary | ICD-10-CM | POA: Diagnosis not present

## 2020-04-17 DIAGNOSIS — Z6826 Body mass index (BMI) 26.0-26.9, adult: Secondary | ICD-10-CM | POA: Diagnosis not present

## 2020-04-17 DIAGNOSIS — J441 Chronic obstructive pulmonary disease with (acute) exacerbation: Secondary | ICD-10-CM | POA: Diagnosis not present

## 2020-04-22 DIAGNOSIS — Z20822 Contact with and (suspected) exposure to covid-19: Secondary | ICD-10-CM | POA: Diagnosis not present

## 2020-04-24 DIAGNOSIS — J449 Chronic obstructive pulmonary disease, unspecified: Secondary | ICD-10-CM | POA: Diagnosis not present

## 2020-04-25 DIAGNOSIS — E7800 Pure hypercholesterolemia, unspecified: Secondary | ICD-10-CM | POA: Diagnosis not present

## 2020-04-25 DIAGNOSIS — K219 Gastro-esophageal reflux disease without esophagitis: Secondary | ICD-10-CM | POA: Diagnosis not present

## 2020-04-25 DIAGNOSIS — Z1331 Encounter for screening for depression: Secondary | ICD-10-CM | POA: Diagnosis not present

## 2020-04-25 DIAGNOSIS — Z6826 Body mass index (BMI) 26.0-26.9, adult: Secondary | ICD-10-CM | POA: Diagnosis not present

## 2020-04-25 DIAGNOSIS — Z79899 Other long term (current) drug therapy: Secondary | ICD-10-CM | POA: Diagnosis not present

## 2020-04-25 DIAGNOSIS — Z85118 Personal history of other malignant neoplasm of bronchus and lung: Secondary | ICD-10-CM | POA: Diagnosis not present

## 2020-04-25 DIAGNOSIS — R7303 Prediabetes: Secondary | ICD-10-CM | POA: Diagnosis not present

## 2020-04-25 DIAGNOSIS — Z9181 History of falling: Secondary | ICD-10-CM | POA: Diagnosis not present

## 2020-04-25 DIAGNOSIS — J441 Chronic obstructive pulmonary disease with (acute) exacerbation: Secondary | ICD-10-CM | POA: Diagnosis not present

## 2020-04-25 DIAGNOSIS — R059 Cough, unspecified: Secondary | ICD-10-CM | POA: Diagnosis not present

## 2020-05-11 DIAGNOSIS — Z8616 Personal history of COVID-19: Secondary | ICD-10-CM

## 2020-05-11 HISTORY — DX: Personal history of COVID-19: Z86.16

## 2020-05-21 DIAGNOSIS — Z20822 Contact with and (suspected) exposure to covid-19: Secondary | ICD-10-CM | POA: Diagnosis not present

## 2020-05-25 DIAGNOSIS — Z20822 Contact with and (suspected) exposure to covid-19: Secondary | ICD-10-CM | POA: Diagnosis not present

## 2020-05-25 DIAGNOSIS — Z79899 Other long term (current) drug therapy: Secondary | ICD-10-CM | POA: Diagnosis not present

## 2020-05-25 DIAGNOSIS — R0600 Dyspnea, unspecified: Secondary | ICD-10-CM | POA: Diagnosis not present

## 2020-05-25 DIAGNOSIS — E876 Hypokalemia: Secondary | ICD-10-CM | POA: Diagnosis not present

## 2020-05-25 DIAGNOSIS — R0602 Shortness of breath: Secondary | ICD-10-CM | POA: Diagnosis not present

## 2020-05-25 DIAGNOSIS — J984 Other disorders of lung: Secondary | ICD-10-CM | POA: Diagnosis not present

## 2020-05-25 DIAGNOSIS — Z87891 Personal history of nicotine dependence: Secondary | ICD-10-CM | POA: Diagnosis not present

## 2020-05-25 DIAGNOSIS — I2510 Atherosclerotic heart disease of native coronary artery without angina pectoris: Secondary | ICD-10-CM | POA: Diagnosis not present

## 2020-05-25 DIAGNOSIS — R42 Dizziness and giddiness: Secondary | ICD-10-CM | POA: Diagnosis not present

## 2020-05-25 DIAGNOSIS — R112 Nausea with vomiting, unspecified: Secondary | ICD-10-CM | POA: Diagnosis not present

## 2020-05-25 DIAGNOSIS — R682 Dry mouth, unspecified: Secondary | ICD-10-CM | POA: Diagnosis not present

## 2020-05-25 DIAGNOSIS — Z85118 Personal history of other malignant neoplasm of bronchus and lung: Secondary | ICD-10-CM | POA: Diagnosis not present

## 2020-05-25 DIAGNOSIS — K5790 Diverticulosis of intestine, part unspecified, without perforation or abscess without bleeding: Secondary | ICD-10-CM | POA: Diagnosis not present

## 2020-05-25 DIAGNOSIS — J439 Emphysema, unspecified: Secondary | ICD-10-CM | POA: Diagnosis not present

## 2020-05-25 DIAGNOSIS — B379 Candidiasis, unspecified: Secondary | ICD-10-CM | POA: Diagnosis not present

## 2020-05-25 DIAGNOSIS — J449 Chronic obstructive pulmonary disease, unspecified: Secondary | ICD-10-CM | POA: Diagnosis not present

## 2020-05-25 DIAGNOSIS — R935 Abnormal findings on diagnostic imaging of other abdominal regions, including retroperitoneum: Secondary | ICD-10-CM | POA: Diagnosis not present

## 2020-05-25 DIAGNOSIS — K219 Gastro-esophageal reflux disease without esophagitis: Secondary | ICD-10-CM | POA: Diagnosis not present

## 2020-05-25 DIAGNOSIS — R1110 Vomiting, unspecified: Secondary | ICD-10-CM | POA: Diagnosis not present

## 2020-05-25 DIAGNOSIS — R634 Abnormal weight loss: Secondary | ICD-10-CM | POA: Diagnosis not present

## 2020-05-27 ENCOUNTER — Ambulatory Visit: Payer: Medicare Other | Admitting: Gastroenterology

## 2020-05-30 DIAGNOSIS — Z85118 Personal history of other malignant neoplasm of bronchus and lung: Secondary | ICD-10-CM | POA: Diagnosis not present

## 2020-05-30 DIAGNOSIS — E876 Hypokalemia: Secondary | ICD-10-CM | POA: Diagnosis not present

## 2020-05-30 DIAGNOSIS — R634 Abnormal weight loss: Secondary | ICD-10-CM | POA: Diagnosis not present

## 2020-05-30 DIAGNOSIS — R590 Localized enlarged lymph nodes: Secondary | ICD-10-CM | POA: Diagnosis not present

## 2020-05-30 DIAGNOSIS — Z6825 Body mass index (BMI) 25.0-25.9, adult: Secondary | ICD-10-CM | POA: Diagnosis not present

## 2020-05-30 DIAGNOSIS — R112 Nausea with vomiting, unspecified: Secondary | ICD-10-CM | POA: Diagnosis not present

## 2020-06-03 ENCOUNTER — Encounter (HOSPITAL_COMMUNITY): Payer: Self-pay | Admitting: Radiology

## 2020-06-03 ENCOUNTER — Other Ambulatory Visit (HOSPITAL_COMMUNITY): Payer: Self-pay | Admitting: Family Medicine

## 2020-06-03 DIAGNOSIS — C774 Secondary and unspecified malignant neoplasm of inguinal and lower limb lymph nodes: Secondary | ICD-10-CM

## 2020-06-03 DIAGNOSIS — R112 Nausea with vomiting, unspecified: Secondary | ICD-10-CM

## 2020-06-03 DIAGNOSIS — R590 Localized enlarged lymph nodes: Secondary | ICD-10-CM

## 2020-06-03 DIAGNOSIS — R634 Abnormal weight loss: Secondary | ICD-10-CM

## 2020-06-03 DIAGNOSIS — C8595 Non-Hodgkin lymphoma, unspecified, lymph nodes of inguinal region and lower limb: Secondary | ICD-10-CM

## 2020-06-03 DIAGNOSIS — Z85118 Personal history of other malignant neoplasm of bronchus and lung: Secondary | ICD-10-CM

## 2020-06-03 NOTE — Progress Notes (Signed)
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

## 2020-06-05 ENCOUNTER — Ambulatory Visit: Payer: Medicare Other | Admitting: Gastroenterology

## 2020-06-05 DIAGNOSIS — Z23 Encounter for immunization: Secondary | ICD-10-CM | POA: Diagnosis not present

## 2020-06-05 DIAGNOSIS — U071 COVID-19: Secondary | ICD-10-CM | POA: Diagnosis not present

## 2020-06-06 DIAGNOSIS — Z85118 Personal history of other malignant neoplasm of bronchus and lung: Secondary | ICD-10-CM | POA: Diagnosis not present

## 2020-06-06 DIAGNOSIS — D696 Thrombocytopenia, unspecified: Secondary | ICD-10-CM | POA: Diagnosis not present

## 2020-06-06 DIAGNOSIS — J439 Emphysema, unspecified: Secondary | ICD-10-CM | POA: Diagnosis not present

## 2020-06-06 DIAGNOSIS — K219 Gastro-esophageal reflux disease without esophagitis: Secondary | ICD-10-CM | POA: Diagnosis not present

## 2020-06-06 DIAGNOSIS — Z66 Do not resuscitate: Secondary | ICD-10-CM | POA: Diagnosis not present

## 2020-06-06 DIAGNOSIS — R110 Nausea: Secondary | ICD-10-CM | POA: Diagnosis not present

## 2020-06-06 DIAGNOSIS — Z7982 Long term (current) use of aspirin: Secondary | ICD-10-CM | POA: Diagnosis not present

## 2020-06-06 DIAGNOSIS — R590 Localized enlarged lymph nodes: Secondary | ICD-10-CM | POA: Diagnosis not present

## 2020-06-06 DIAGNOSIS — U071 COVID-19: Secondary | ICD-10-CM | POA: Diagnosis not present

## 2020-06-06 DIAGNOSIS — R531 Weakness: Secondary | ICD-10-CM | POA: Diagnosis not present

## 2020-06-06 DIAGNOSIS — I2510 Atherosclerotic heart disease of native coronary artery without angina pectoris: Secondary | ICD-10-CM | POA: Diagnosis not present

## 2020-06-06 DIAGNOSIS — J189 Pneumonia, unspecified organism: Secondary | ICD-10-CM | POA: Diagnosis not present

## 2020-06-06 DIAGNOSIS — E785 Hyperlipidemia, unspecified: Secondary | ICD-10-CM | POA: Diagnosis not present

## 2020-06-06 DIAGNOSIS — Z87891 Personal history of nicotine dependence: Secondary | ICD-10-CM | POA: Diagnosis not present

## 2020-06-06 DIAGNOSIS — R0600 Dyspnea, unspecified: Secondary | ICD-10-CM | POA: Diagnosis not present

## 2020-06-06 DIAGNOSIS — K449 Diaphragmatic hernia without obstruction or gangrene: Secondary | ICD-10-CM | POA: Diagnosis not present

## 2020-06-06 DIAGNOSIS — J1282 Pneumonia due to coronavirus disease 2019: Secondary | ICD-10-CM | POA: Diagnosis not present

## 2020-06-06 DIAGNOSIS — D6869 Other thrombophilia: Secondary | ICD-10-CM | POA: Diagnosis not present

## 2020-06-06 DIAGNOSIS — J449 Chronic obstructive pulmonary disease, unspecified: Secondary | ICD-10-CM | POA: Diagnosis not present

## 2020-06-06 DIAGNOSIS — E860 Dehydration: Secondary | ICD-10-CM | POA: Diagnosis not present

## 2020-06-07 DIAGNOSIS — Z87891 Personal history of nicotine dependence: Secondary | ICD-10-CM | POA: Diagnosis not present

## 2020-06-07 DIAGNOSIS — K449 Diaphragmatic hernia without obstruction or gangrene: Secondary | ICD-10-CM | POA: Diagnosis not present

## 2020-06-07 DIAGNOSIS — Z79899 Other long term (current) drug therapy: Secondary | ICD-10-CM | POA: Diagnosis not present

## 2020-06-07 DIAGNOSIS — J1282 Pneumonia due to coronavirus disease 2019: Secondary | ICD-10-CM | POA: Diagnosis not present

## 2020-06-07 DIAGNOSIS — R110 Nausea: Secondary | ICD-10-CM | POA: Diagnosis not present

## 2020-06-07 DIAGNOSIS — J449 Chronic obstructive pulmonary disease, unspecified: Secondary | ICD-10-CM | POA: Diagnosis not present

## 2020-06-07 DIAGNOSIS — Z7901 Long term (current) use of anticoagulants: Secondary | ICD-10-CM | POA: Diagnosis not present

## 2020-06-07 DIAGNOSIS — J189 Pneumonia, unspecified organism: Secondary | ICD-10-CM | POA: Diagnosis not present

## 2020-06-07 DIAGNOSIS — R531 Weakness: Secondary | ICD-10-CM | POA: Diagnosis not present

## 2020-06-07 DIAGNOSIS — K219 Gastro-esophageal reflux disease without esophagitis: Secondary | ICD-10-CM | POA: Diagnosis not present

## 2020-06-07 DIAGNOSIS — R112 Nausea with vomiting, unspecified: Secondary | ICD-10-CM | POA: Diagnosis not present

## 2020-06-07 DIAGNOSIS — R1013 Epigastric pain: Secondary | ICD-10-CM | POA: Diagnosis not present

## 2020-06-07 DIAGNOSIS — E785 Hyperlipidemia, unspecified: Secondary | ICD-10-CM | POA: Diagnosis not present

## 2020-06-07 DIAGNOSIS — R634 Abnormal weight loss: Secondary | ICD-10-CM | POA: Diagnosis not present

## 2020-06-07 DIAGNOSIS — D696 Thrombocytopenia, unspecified: Secondary | ICD-10-CM | POA: Diagnosis not present

## 2020-06-07 DIAGNOSIS — Z66 Do not resuscitate: Secondary | ICD-10-CM | POA: Diagnosis not present

## 2020-06-07 DIAGNOSIS — U071 COVID-19: Secondary | ICD-10-CM | POA: Diagnosis not present

## 2020-06-07 DIAGNOSIS — Z9981 Dependence on supplemental oxygen: Secondary | ICD-10-CM | POA: Diagnosis not present

## 2020-06-07 DIAGNOSIS — J439 Emphysema, unspecified: Secondary | ICD-10-CM | POA: Diagnosis not present

## 2020-06-07 DIAGNOSIS — Z7952 Long term (current) use of systemic steroids: Secondary | ICD-10-CM | POA: Diagnosis not present

## 2020-06-07 DIAGNOSIS — Z7982 Long term (current) use of aspirin: Secondary | ICD-10-CM | POA: Diagnosis not present

## 2020-06-07 DIAGNOSIS — R0600 Dyspnea, unspecified: Secondary | ICD-10-CM | POA: Diagnosis not present

## 2020-06-07 DIAGNOSIS — R590 Localized enlarged lymph nodes: Secondary | ICD-10-CM | POA: Diagnosis not present

## 2020-06-07 DIAGNOSIS — D6869 Other thrombophilia: Secondary | ICD-10-CM | POA: Diagnosis not present

## 2020-06-07 DIAGNOSIS — E860 Dehydration: Secondary | ICD-10-CM | POA: Diagnosis not present

## 2020-06-07 DIAGNOSIS — R6881 Early satiety: Secondary | ICD-10-CM | POA: Diagnosis not present

## 2020-06-07 DIAGNOSIS — Z85118 Personal history of other malignant neoplasm of bronchus and lung: Secondary | ICD-10-CM | POA: Diagnosis not present

## 2020-06-07 DIAGNOSIS — D6859 Other primary thrombophilia: Secondary | ICD-10-CM | POA: Diagnosis not present

## 2020-06-07 DIAGNOSIS — I2510 Atherosclerotic heart disease of native coronary artery without angina pectoris: Secondary | ICD-10-CM | POA: Diagnosis not present

## 2020-06-08 DIAGNOSIS — D6859 Other primary thrombophilia: Secondary | ICD-10-CM | POA: Diagnosis not present

## 2020-06-08 DIAGNOSIS — K219 Gastro-esophageal reflux disease without esophagitis: Secondary | ICD-10-CM | POA: Diagnosis not present

## 2020-06-08 DIAGNOSIS — Z9981 Dependence on supplemental oxygen: Secondary | ICD-10-CM | POA: Diagnosis not present

## 2020-06-08 DIAGNOSIS — U071 COVID-19: Secondary | ICD-10-CM | POA: Diagnosis not present

## 2020-06-08 DIAGNOSIS — I2510 Atherosclerotic heart disease of native coronary artery without angina pectoris: Secondary | ICD-10-CM | POA: Diagnosis not present

## 2020-06-08 DIAGNOSIS — Z7982 Long term (current) use of aspirin: Secondary | ICD-10-CM | POA: Diagnosis not present

## 2020-06-08 DIAGNOSIS — Z7952 Long term (current) use of systemic steroids: Secondary | ICD-10-CM | POA: Diagnosis not present

## 2020-06-08 DIAGNOSIS — Z79899 Other long term (current) drug therapy: Secondary | ICD-10-CM | POA: Diagnosis not present

## 2020-06-08 DIAGNOSIS — R110 Nausea: Secondary | ICD-10-CM | POA: Diagnosis not present

## 2020-06-08 DIAGNOSIS — J449 Chronic obstructive pulmonary disease, unspecified: Secondary | ICD-10-CM | POA: Diagnosis not present

## 2020-06-09 DIAGNOSIS — R110 Nausea: Secondary | ICD-10-CM | POA: Diagnosis not present

## 2020-06-09 DIAGNOSIS — Z79899 Other long term (current) drug therapy: Secondary | ICD-10-CM | POA: Diagnosis not present

## 2020-06-09 DIAGNOSIS — Z7901 Long term (current) use of anticoagulants: Secondary | ICD-10-CM | POA: Diagnosis not present

## 2020-06-09 DIAGNOSIS — D6859 Other primary thrombophilia: Secondary | ICD-10-CM | POA: Diagnosis not present

## 2020-06-09 DIAGNOSIS — Z9981 Dependence on supplemental oxygen: Secondary | ICD-10-CM | POA: Diagnosis not present

## 2020-06-09 DIAGNOSIS — J449 Chronic obstructive pulmonary disease, unspecified: Secondary | ICD-10-CM | POA: Diagnosis not present

## 2020-06-09 DIAGNOSIS — U071 COVID-19: Secondary | ICD-10-CM | POA: Diagnosis not present

## 2020-06-09 DIAGNOSIS — I2510 Atherosclerotic heart disease of native coronary artery without angina pectoris: Secondary | ICD-10-CM | POA: Diagnosis not present

## 2020-06-09 DIAGNOSIS — K219 Gastro-esophageal reflux disease without esophagitis: Secondary | ICD-10-CM | POA: Diagnosis not present

## 2020-06-09 DIAGNOSIS — Z7952 Long term (current) use of systemic steroids: Secondary | ICD-10-CM | POA: Diagnosis not present

## 2020-06-10 ENCOUNTER — Encounter (HOSPITAL_COMMUNITY): Payer: Self-pay

## 2020-06-10 ENCOUNTER — Ambulatory Visit (HOSPITAL_COMMUNITY): Admission: RE | Admit: 2020-06-10 | Payer: Medicare Other | Source: Ambulatory Visit

## 2020-06-10 DIAGNOSIS — Z7982 Long term (current) use of aspirin: Secondary | ICD-10-CM | POA: Diagnosis not present

## 2020-06-10 DIAGNOSIS — K219 Gastro-esophageal reflux disease without esophagitis: Secondary | ICD-10-CM | POA: Diagnosis not present

## 2020-06-10 DIAGNOSIS — Z7901 Long term (current) use of anticoagulants: Secondary | ICD-10-CM | POA: Diagnosis not present

## 2020-06-10 DIAGNOSIS — D6859 Other primary thrombophilia: Secondary | ICD-10-CM | POA: Diagnosis not present

## 2020-06-10 DIAGNOSIS — J449 Chronic obstructive pulmonary disease, unspecified: Secondary | ICD-10-CM | POA: Diagnosis not present

## 2020-06-10 DIAGNOSIS — U071 COVID-19: Secondary | ICD-10-CM | POA: Diagnosis not present

## 2020-06-10 DIAGNOSIS — Z9981 Dependence on supplemental oxygen: Secondary | ICD-10-CM | POA: Diagnosis not present

## 2020-06-10 DIAGNOSIS — I2510 Atherosclerotic heart disease of native coronary artery without angina pectoris: Secondary | ICD-10-CM | POA: Diagnosis not present

## 2020-06-10 DIAGNOSIS — R110 Nausea: Secondary | ICD-10-CM | POA: Diagnosis not present

## 2020-06-10 DIAGNOSIS — R6881 Early satiety: Secondary | ICD-10-CM | POA: Diagnosis not present

## 2020-06-10 DIAGNOSIS — R1013 Epigastric pain: Secondary | ICD-10-CM | POA: Diagnosis not present

## 2020-06-10 DIAGNOSIS — Z7952 Long term (current) use of systemic steroids: Secondary | ICD-10-CM | POA: Diagnosis not present

## 2020-06-17 DIAGNOSIS — R7989 Other specified abnormal findings of blood chemistry: Secondary | ICD-10-CM | POA: Diagnosis not present

## 2020-06-17 DIAGNOSIS — R112 Nausea with vomiting, unspecified: Secondary | ICD-10-CM | POA: Diagnosis not present

## 2020-06-17 DIAGNOSIS — Z9981 Dependence on supplemental oxygen: Secondary | ICD-10-CM | POA: Diagnosis not present

## 2020-06-17 DIAGNOSIS — R319 Hematuria, unspecified: Secondary | ICD-10-CM | POA: Diagnosis not present

## 2020-06-17 DIAGNOSIS — Z79899 Other long term (current) drug therapy: Secondary | ICD-10-CM | POA: Diagnosis not present

## 2020-06-17 DIAGNOSIS — J449 Chronic obstructive pulmonary disease, unspecified: Secondary | ICD-10-CM | POA: Diagnosis not present

## 2020-06-17 DIAGNOSIS — J1282 Pneumonia due to coronavirus disease 2019: Secondary | ICD-10-CM | POA: Diagnosis not present

## 2020-06-17 DIAGNOSIS — R590 Localized enlarged lymph nodes: Secondary | ICD-10-CM | POA: Diagnosis not present

## 2020-06-17 DIAGNOSIS — U071 COVID-19: Secondary | ICD-10-CM | POA: Diagnosis not present

## 2020-06-17 DIAGNOSIS — M549 Dorsalgia, unspecified: Secondary | ICD-10-CM | POA: Diagnosis not present

## 2020-06-17 DIAGNOSIS — E46 Unspecified protein-calorie malnutrition: Secondary | ICD-10-CM | POA: Diagnosis not present

## 2020-06-17 DIAGNOSIS — J9611 Chronic respiratory failure with hypoxia: Secondary | ICD-10-CM | POA: Diagnosis not present

## 2020-06-19 DIAGNOSIS — E876 Hypokalemia: Secondary | ICD-10-CM | POA: Diagnosis not present

## 2020-06-19 DIAGNOSIS — Z79899 Other long term (current) drug therapy: Secondary | ICD-10-CM | POA: Diagnosis not present

## 2020-06-26 ENCOUNTER — Encounter: Payer: Self-pay | Admitting: Gastroenterology

## 2020-06-26 ENCOUNTER — Ambulatory Visit (INDEPENDENT_AMBULATORY_CARE_PROVIDER_SITE_OTHER): Payer: Medicare Other | Admitting: Gastroenterology

## 2020-06-26 VITALS — BP 122/72 | HR 67 | Ht 65.0 in | Wt 143.0 lb

## 2020-06-26 DIAGNOSIS — R1013 Epigastric pain: Secondary | ICD-10-CM

## 2020-06-26 DIAGNOSIS — R112 Nausea with vomiting, unspecified: Secondary | ICD-10-CM

## 2020-06-26 DIAGNOSIS — R935 Abnormal findings on diagnostic imaging of other abdominal regions, including retroperitoneum: Secondary | ICD-10-CM

## 2020-06-26 MED ORDER — PANTOPRAZOLE SODIUM 40 MG PO TBEC: 40.0000 mg | DELAYED_RELEASE_TABLET | Freq: Two times a day (BID) | ORAL | 11 refills | Status: DC

## 2020-06-26 MED ORDER — PROMETHAZINE HCL 25 MG PO TABS: 25.0000 mg | ORAL_TABLET | Freq: Four times a day (QID) | ORAL | 2 refills | Status: DC | PRN

## 2020-06-26 NOTE — Progress Notes (Signed)
Chief Complaint: FU  Referring Provider:  Leonides Sake, MD      ASSESSMENT AND PLAN;   #1.  N/V with wt loss. CT AP 05/25/2020 concerning for metastatic disease (recurrence of lung CA). Awaiting IR LN biopsy.  #2. GERD with small HH dysphagia.  Resolved after EGD with dil 02/2020.   #3. Associated COPD on home O2, 3V CAD, chronic neck and low back pain, HLD, anxiety.  #4. H/O Lung AdenoCa s/p RUL lobectomy 01/2015, another lung adeno CA  LLL lobe s/p wedge resection 07/2018.  Now with findings highly s/o recurrence.  Plan: -Increased Protonix 40mg  po bid #60, 11 refills -Continue Pherergan 25mg  po q6hrs prn # 30 2 reiils -Proceed with Colguard test -Having extensive WU on July 02, 2020 as below -Hold off on EGD/colonoscopy at this time.  Depending upon the lymph node biopsy results, we can always consider performing endoscopic evaluation.  I have discussed extensively with the patient and patient's daughter. -FU in 8 weeks.  Earlier if still with problems.   HPI:    Bailey Nguyen is a 75 y.o. female  Follow-up With H/O COPD on O2, Ca lung as above  Intermittent N/V  Eval at [facility] ED 05/25/2020.  Underwent CT Abdo/pelvis with contrast showing new enlarged left inguinal and left external iliac lymph nodes concerning for metastatic disease. She is scheduled for IR guided lymph node biopsy on 07/09/2020 by Dr. Vernard Gambles.  She has FU appointment with oncology. She is also scheduled for -High-resolution CT chest -PET scan -2D echo -Pulmonary function tests All on July 02, 2020.  N/V is better with Phenergan.  She uses tablets in the morning and suppositories at night.  Also her pulmonary disease is getting worse.  She is on oxygen currently.  She did not get Cologuard test.  She had refused colonoscopy in the past several times.  Denies having any significant lower GI problems except for constipation which is better on MiraLAX.  No melena or hematochezia.  Has lost  weight as below Wt Readings from Last 3 Encounters:  06/26/20 143 lb (64.9 kg)  03/07/20 162 lb (73.5 kg)  02/28/20 162 lb (73.5 kg)       From previous records:  CT Abdo/pelvis with contrast 05/25/2020 at [facility] enlarged left inguinal and left external iliac lymph nodes concerning for metastatic disease.  Otherwise no intra-abdominal abnormalities. -Diverticulosis -Small low-attenuation areas in the liver as before.   -dysphagia mostly to solids, no problems with liquids, mid chest. S/P EGDs with dil in 2013, 2016 and 2018, 2020 with good relief Had colonoscopy 09/2013-limited due to preparation, grossly negative except for moderate sigmoid diverticulosis and internal hemorrhoids. Had epigastric pain in August 2019, went to [facility], underwent CT scan of the abdomen pelvis which was unremarkable.  Does not want repeat colonoscopy.  CT Chest 04/2019 1. No evidence of local tumor recurrence status post right upper lobectomy and medial left lower lobe wedge resection. 2. No findings of metastatic disease in the chest. 3. Three-vessel coronary atherosclerosis.  PET 06/2018 Abdomen/Pelvis: - Liver: Multiple subcentimeter hepatic hypodensities are unchanged from 2016 activity and exhibit no FDG uptake. Scattered parenchymal calcifications are noted. - Gallbladder: Surgically absent. - Spleen: No splenomegaly. No focal abnormalities. - Pancreas: No focal abnormality - Adrenal glands: Unremarkable - Kidneys: Unremarkable - GI Tract: Colonic diverticulosis. - GU Tract: Unremarkable - Adenopathy: None  Past Medical History:  Diagnosis Date  . Anxiety   . Back ache   .  Cataract    il cataracts removed  . Chest wall pain   . Chronic constipation   . COPD (chronic obstructive pulmonary disease) ([place])   . Coronary artery calcification seen on CT scan 01/01/2016  . Diverticula of intestine    sigmond  . Dyskinesia of esophagus   . Emphysema of lung ([place])    . GERD (gastroesophageal reflux disease)   . Glaucoma   . Hx of colonic polyps   . Hx of Lyme disease   . Hx of TIA (transient ischemic attack) and stroke   . Hypercholesteremia   . Lung cancer ([place])    Removed upper right portion of lung   . Neck pain   . Osteoporosis   . Prediabetes   . Splenic infarct   . Stroke [facility])    TIA  . Tobacco use disorder     Past Surgical History:  Procedure Laterality Date  . BACK SURGERY    . CHOLECYSTECTOMY    . COLONOSCOPY  09/22/2013   Moderate predominantly sigmoid diverticulosis. Moderate internal hemorrhoids. Otherwise normal colonoscopy. Limited due to qualityt of prep/.  . ESOPHAGOGASTRODUODENOSCOPY  03/05/2017   Peptic esophageal stricture. Gastritis.   Marland Kitchen LUNG REMOVAL, PARTIAL Right 01/2015  . LUNG SURGERY Left 07/24/2018  . TUBAL LIGATION      Family History  Problem Relation Age of Onset  . Diabetes Sister   . Diabetes Brother   . Lung cancer Brother   . Diabetes Father   . Pancreatic cancer Father   . Coronary artery disease Mother 39  . Thyroid cancer Daughter   . Colon cancer Neg Hx   . Esophageal cancer Neg Hx   . Stomach cancer Neg Hx   . Rectal cancer Neg Hx     Social History   Tobacco Use  . Smoking status: Former Smoker    Packs/day: 1.00    Types: Cigarettes    Quit date: 02/01/2015    Years since quitting: 5.4  . Smokeless tobacco: Never Used  Vaping Use  . Vaping Use: Former  . Devices: Tried it before for a week  Substance Use Topics  . Alcohol use: No    Alcohol/week: 0.0 standard drinks  . Drug use: No    Current Outpatient Medications  Medication Sig Dispense Refill  . ALBUTEROL IN Inhale into the lungs as needed.    . AMBULATORY NON FORMULARY MEDICATION Medication Name: GI Cocktail  (Viscous Lidocaine, Bentyl, Maalox equal parts)  Take 10cc four times daily as needed 120 mL 1  . apixaban (ELIQUIS) 5 MG TABS tablet Take 2.5 mg by mouth 2 (two) times daily.    Marland Kitchen aspirin 325 MG tablet Take  by mouth as needed.    Marland Kitchen azithromycin (ZITHROMAX) 250 MG tablet Take 2 tables on Day 1 (500 mg) followed by 1 tablet (250 mg) once daily on Days 2-5    . brinzolamide (AZOPT) 1 % ophthalmic suspension Place 1 drop into the right eye 2 (two) times daily.    . Cholecalciferol 25 MCG (1000 UT) tablet Take by mouth.    . cyclobenzaprine (FLEXERIL) 5 MG tablet Take 5 mg by mouth 3 (three) times daily as needed. (back muscle pain)  1  . cycloSPORINE (RESTASIS OP) Apply to eye. 2x day every 12 hours.    Marland Kitchen EPINEPHrine 0.3 mg/0.3 mL IJ SOAJ injection as needed.    Marland Kitchen ibuprofen (ADVIL,MOTRIN) 800 MG tablet Take 800 mg by mouth as needed.    Marland Kitchen ipratropium-albuterol (DUONEB)  0.5-2.5 (3) MG/3ML SOLN Take 3 mLs by nebulization.    Marland Kitchen nystatin (MYCOSTATIN) 100000 UNIT/ML suspension as needed.    . OXYGEN Inhale 2 L into the lungs at bedtime.    . pantoprazole (PROTONIX) 40 MG tablet Take 1 tablet (40 mg total) by mouth 2 (two) times daily. (Patient taking differently: Take 40 mg by mouth daily.) 60 tablet 6  . polyethylene glycol (MIRALAX / GLYCOLAX) packet Take 17 g by mouth daily as needed for constipation.    . promethazine (PHENERGAN) 25 MG tablet Take 25 mg by mouth every 6 (six) hours as needed for nausea or vomiting.    . rosuvastatin (CRESTOR) 40 MG tablet Take 40 mg by mouth daily.    . sodium chloride 0.9 % nebulizer solution USE 3 (THREE) MILLILITER 3 TO 4 TIMES DAILY AS NEEDED FOR CLEARING MUCUS    . triamcinolone cream (KENALOG) 0.5 % Apply 1 application topically 3 (three) times daily.     No current facility-administered medications for this visit.    No Known Allergies  Review of Systems:  Negative except for HPI.     Physical Exam:    BP 122/72 (BP Location: Left Arm, Patient Position: Sitting, Cuff Size: Normal)   Pulse 67   Ht 5\' 5"  (1.651 m)   Wt 143 lb (64.9 kg)   BMI 23.80 kg/m  Filed Weights   06/26/20 1120  Weight: 143 lb (64.9 kg)   Constitutional:  Gets dyspneic on  minimal exertion.  Cachectic female Psychiatric: Normal mood and affect. Behavior is normal. HEENT: Pupils normal.  Conjunctivae are normal. No scleral icterus.  No thrush. Neck supple.  Cardiovascular: Normal rate, regular rhythm. No edema Pulmonary/chest: Bilateral decreased breath sounds.  No wheezing or rhonchi. Abdominal: Soft, nondistended. Nontender. Bowel sounds active throughout. There are no masses palpable. No hepatomegaly. Rectal:  defered Neurological: Alert and oriented to person place and time. Skin: Skin is warm and dry. No rashes noted.  Data Reviewed: I have personally reviewed following labs and imaging studies  CBC: CBC Latest Ref Rng & Units 07/02/2008 07/22/2007  WBC - - 6.3  Hemoglobin 12.0 - 15.0 g/dL 15.6(H) 14.5  Hematocrit 36.0 - 46.0 % 46.0 41.6  Platelets - - 277    CMP: CMP Latest Ref Rng & Units 08/29/2019 07/02/2008 07/22/2007  Glucose 65 - 99 mg/dL 94 102(H) 74  BUN 8 - 27 mg/dL 14 17 14   Creatinine 0.57 - 1.00 mg/dL 0.96 0.6 0.85  Sodium 134 - 144 mmol/L 142 142 141  Potassium 3.5 - 5.2 mmol/L 4.4 3.9 3.9  Chloride 96 - 106 mmol/L 105 110 107  CO2 20 - 29 mmol/L 22 - 26  Calcium 8.7 - 10.3 mg/dL 9.6 - 9.0  Total Protein 6.0 - 8.5 g/dL 7.4 - -  Total Bilirubin 0.0 - 1.2 mg/dL 0.2 - -  Alkaline Phos 39 - 117 IU/L 88 - -  AST 0 - 40 IU/L 13 - -  ALT 0 - 32 IU/L 13 - -      Carmell Austria, MD 06/26/2020, 11:35 AM  Cc: Leonides Sake, MD

## 2020-06-26 NOTE — Patient Instructions (Addendum)
If you are age 73 or older, your body mass index should be between 23-30. Your Body mass index is 23.8 kg/m. If this is out of the aforementioned range listed, please consider follow up with your Primary Care Provider.  If you are age 35 or younger, your body mass index should be between 19-25. Your Body mass index is 23.8 kg/m. If this is out of the aformentioned range listed, please consider follow up with your Primary Care Provider.   Your provider has ordered Cologuard testing as an option for colon cancer screening. This is performed by [employer] and may be out of network with your insurance. PRIOR to completing the test, it is YOUR responsibility to contact your insurance about covered benefits for this test. Your out of pocket expense could be anywhere from $0.00 to $649.00.   When you call to check coverage with your insurer, please provide the following information:   -The ONLY provider of Cologuard is [place] code for Cologuard is [phone].  [employer] NPI # [id number]  -[employer] Tax ID # [id number]   We have already sent your demographic and insurance information to [employer] (phone number [phone]) and they should contact you within the next week regarding your test. If you have not heard from them within the next week, please call our office at [phone].    We have sent the following medications to your pharmacy for you to pick up at your convenience: Protonix Phenergan  Follow up in 8 weeks.   We mailed your paperwork back to you  Thank you,  Dr. Jackquline Denmark'

## 2020-06-27 ENCOUNTER — Other Ambulatory Visit: Payer: Self-pay | Admitting: Student

## 2020-06-27 ENCOUNTER — Other Ambulatory Visit: Payer: Self-pay | Admitting: Radiology

## 2020-06-28 ENCOUNTER — Other Ambulatory Visit: Payer: Self-pay | Admitting: Student

## 2020-07-01 ENCOUNTER — Ambulatory Visit (HOSPITAL_COMMUNITY)
Admission: RE | Admit: 2020-07-01 | Discharge: 2020-07-01 | Disposition: A | Discharge status: Home / Self Care | Payer: Medicare Other | Source: Ambulatory Visit | Attending: Family Medicine | Admitting: Family Medicine

## 2020-07-01 ENCOUNTER — Encounter (HOSPITAL_COMMUNITY): Payer: Self-pay

## 2020-07-01 ENCOUNTER — Other Ambulatory Visit: Payer: Self-pay

## 2020-07-01 DIAGNOSIS — Z85118 Personal history of other malignant neoplasm of bronchus and lung: Secondary | ICD-10-CM | POA: Insufficient documentation

## 2020-07-01 DIAGNOSIS — Z79899 Other long term (current) drug therapy: Secondary | ICD-10-CM | POA: Insufficient documentation

## 2020-07-01 DIAGNOSIS — Z808 Family history of malignant neoplasm of other organs or systems: Secondary | ICD-10-CM | POA: Diagnosis not present

## 2020-07-01 DIAGNOSIS — R634 Abnormal weight loss: Secondary | ICD-10-CM | POA: Insufficient documentation

## 2020-07-01 DIAGNOSIS — I700 Atherosclerosis of aorta: Secondary | ICD-10-CM | POA: Diagnosis not present

## 2020-07-01 DIAGNOSIS — R590 Localized enlarged lymph nodes: Secondary | ICD-10-CM | POA: Diagnosis not present

## 2020-07-01 DIAGNOSIS — Z801 Family history of malignant neoplasm of trachea, bronchus and lung: Secondary | ICD-10-CM | POA: Insufficient documentation

## 2020-07-01 DIAGNOSIS — C8595 Non-Hodgkin lymphoma, unspecified, lymph nodes of inguinal region and lower limb: Secondary | ICD-10-CM

## 2020-07-01 DIAGNOSIS — Z87891 Personal history of nicotine dependence: Secondary | ICD-10-CM | POA: Diagnosis not present

## 2020-07-01 DIAGNOSIS — C774 Secondary and unspecified malignant neoplasm of inguinal and lower limb lymph nodes: Secondary | ICD-10-CM | POA: Diagnosis not present

## 2020-07-01 DIAGNOSIS — Z7901 Long term (current) use of anticoagulants: Secondary | ICD-10-CM | POA: Diagnosis not present

## 2020-07-01 DIAGNOSIS — R112 Nausea with vomiting, unspecified: Secondary | ICD-10-CM | POA: Diagnosis not present

## 2020-07-01 DIAGNOSIS — Z800 Family history of malignant neoplasm of digestive organs: Secondary | ICD-10-CM | POA: Insufficient documentation

## 2020-07-01 DIAGNOSIS — C801 Malignant (primary) neoplasm, unspecified: Secondary | ICD-10-CM | POA: Diagnosis not present

## 2020-07-01 DIAGNOSIS — K5790 Diverticulosis of intestine, part unspecified, without perforation or abscess without bleeding: Secondary | ICD-10-CM | POA: Insufficient documentation

## 2020-07-01 LAB — CBC
HCT: 37.6 % (ref 36.0–46.0)
Hemoglobin: 12.5 g/dL (ref 12.0–15.0)
MCH: 29.8 pg (ref 26.0–34.0)
MCHC: 33.2 g/dL (ref 30.0–36.0)
MCV: 89.7 fL (ref 80.0–100.0)
Platelets: 398 10*3/uL (ref 150–400)
RBC: 4.19 MIL/uL (ref 3.87–5.11)
RDW: 14.3 % (ref 11.5–15.5)
WBC: 5.7 10*3/uL (ref 4.0–10.5)
nRBC: 0 % (ref 0.0–0.2)

## 2020-07-01 LAB — PROTIME-INR
INR: 1.2 (ref 0.8–1.2)
Prothrombin Time: 14.6 seconds (ref 11.4–15.2)

## 2020-07-01 MED ORDER — FENTANYL CITRATE (PF) 100 MCG/2ML IJ SOLN
INTRAMUSCULAR | Status: AC
  Filled 2020-07-01: qty 2

## 2020-07-01 MED ORDER — FENTANYL CITRATE (PF) 100 MCG/2ML IJ SOLN
INTRAMUSCULAR | Status: AC | PRN
  Administered 2020-07-01 (×2): 25 ug via INTRAVENOUS

## 2020-07-01 MED ORDER — MIDAZOLAM HCL 2 MG/2ML IJ SOLN
INTRAMUSCULAR | Status: AC | PRN
  Administered 2020-07-01: 1 mg via INTRAVENOUS
  Administered 2020-07-01: 0.5 mg via INTRAVENOUS

## 2020-07-01 MED ORDER — SODIUM CHLORIDE 0.9 % IV SOLN: INTRAVENOUS | Status: DC

## 2020-07-01 MED ORDER — LIDOCAINE HCL (PF) 1 % IJ SOLN
INTRAMUSCULAR | Status: AC
  Filled 2020-07-01: qty 30

## 2020-07-01 MED ORDER — MIDAZOLAM HCL 2 MG/2ML IJ SOLN
INTRAMUSCULAR | Status: AC
  Filled 2020-07-01: qty 2

## 2020-07-01 NOTE — Procedures (Signed)
Interventional Radiology Procedure Note  Procedure: Ultrasound guided left inguinal lymph node biopsy  Findings: Please refer to procedural dictation for full description. 18 ga left inguinal core x5.  Samples placed in saline and sent to Pathology.  Complications: None immediate  Estimated Blood Loss: < 5 mL  Recommendations: Follow up Pathology results.   Ruthann Cancer, MD

## 2020-07-01 NOTE — Discharge Instructions (Signed)
Lymph Node Biopsy, Care After The following information offers guidance on how to care for yourself after your procedure. Your health care provider may also give you more specific instructions. If you have problems or questions, contact your health care provider. What can I expect after the procedure? After the procedure, it is common to have:  Bruising.  Soreness.  Mild swelling. Follow these instructions at home: Medicines  Take over-the-counter and prescription medicines only as told by your health care provider.  If you were prescribed an antibiotic medicine, take or use it as told by your health care provider. Do not stop taking the antibiotic even if you start to feel better.  Do not drive or use heavy machinery while taking prescription pain medicine. Incision care  Follow instructions from your health care provider about how to take care of your incision. Make sure you: ? Wash your hands with soap and water for at least 20 seconds before and after you change your bandage (dressing). If soap and water are not available, use hand sanitizer. ? Change your dressing as told by your health care provider. ? Leave stitches (sutures), skin glue, or adhesive strips in place. These skin closures may need to stay in place for 2 weeks or longer. If adhesive strip edges start to loosen and curl up, you may trim the loose edges. Do not remove adhesive strips completely unless your health care provider tells you to do that.  Check your incision area every day for signs of infection. Check for: ? More redness, swelling, or pain. ? Fluid or blood. ? Warmth. ? Pus or a bad smell.   General instructions  Do not take baths, swim, or use a hot tub until your health care provider approves. Ask your health care provider if you may take showers. You may only be allowed to take sponge baths.  If you were given a sedative during the procedure, it can affect you for several hours. Do not drive or  operate machinery until your health care provider says that it is safe.  Return to your normal activities as told by your health care provider. Ask your health care provider what activities are safe for you.  Keep all follow-up visits. This is important. Contact a health care provider if:  You have a fever.  You have increased redness, swelling, or pain around your incision.  You have fluid or blood coming from your incision.  Your incision feels warm to the touch.  You have pus or a bad smell coming from your incision.  You have pain or numbness that gets worse or lasts longer than a few days. Summary  After a lymph node biopsy, it is common to have bruising, soreness, and mild swelling.  Follow your health care provider's instructions about taking care of yourself at home. You will be told how to take medicines, take care of your incision, and check for infection.  Return to your normal activities as told by your health care provider. Ask your health care provider what activities are safe for you.  Contact a health care provider if you have increased redness, swelling, or pain around your incision, you have a fever, or you have worsening pain or numbness. This information is not intended to replace advice given to you by your health care provider. Make sure you discuss any questions you have with your health care provider. Document Revised: 02/08/2020 Document Reviewed: 02/08/2020 Elsevier Patient Education  [place].

## 2020-07-01 NOTE — H&P (Signed)
Chief Complaint: Patient was seen in consultation today for left inguinal lymph node biospy at the request of Hamrick,Maura L  Referring Physician(s): Hamrick,Maura L  Supervising Physician: Ruthann Cancer  Patient Status: [facility] - Out-pt  History of Present Illness: Bailey Nguyen is a 75 y.o. female   Hx Lung Cancer-- Rt 5 yrs ago; L 2 yrs ago N/V and weight loss for few weeks Follows with Dr Lisbeth Ply Sent for CT 06/01/20 MPRESSION: 1. There are new enlarged left inguinal and left external iliac lymph nodes, concerning for metastatic disease. No finding in the abdomen or pelvis to suggest a primary malignancy. Consider percutaneous biopsy of the largest left inguinal lymph node. Additionally may consider PET-CT for staging evaluation. 2. Small low-attenuation area in the anterior liver adjacent to the falciform ligament likely representing focal fatty deposition. 3. Diverticulosis without evidence of diverticulitis. 4. Aortic atherosclerosis.  Pt states left inguinal LN has grown even in this last week Tender to touch  Takes Eliquis daily: half in am; half in pm  Scheduled now for biopsy of LN  Past Medical History:  Diagnosis Date  . Anxiety   . Back ache   . Cataract    il cataracts removed  . Chest wall pain   . Chronic constipation   . COPD (chronic obstructive pulmonary disease) ([place])   . Coronary artery calcification seen on CT scan 01/01/2016  . Diverticula of intestine    sigmond  . Dyskinesia of esophagus   . Emphysema of lung ([place])   . GERD (gastroesophageal reflux disease)   . Glaucoma   . Hx of colonic polyps   . Hx of Lyme disease   . Hx of TIA (transient ischemic attack) and stroke   . Hypercholesteremia   . Lung cancer ([place])    Removed upper right portion of lung   . Neck pain   . Osteoporosis   . Prediabetes   . Splenic infarct   . Stroke [facility])    TIA  . Tobacco use disorder     Past Surgical History:  Procedure Laterality Date  .  BACK SURGERY    . CHOLECYSTECTOMY    . COLONOSCOPY  09/22/2013   Moderate predominantly sigmoid diverticulosis. Moderate internal hemorrhoids. Otherwise normal colonoscopy. Limited due to qualityt of prep/.  . ESOPHAGOGASTRODUODENOSCOPY  03/05/2017   Peptic esophageal stricture. Gastritis.   Marland Kitchen LUNG REMOVAL, PARTIAL Right 01/2015  . LUNG SURGERY Left 07/24/2018  . TUBAL LIGATION      Allergies: Patient has no known allergies.  Medications: Prior to Admission medications   Medication Sig Start Date End Date Taking? Authorizing Provider  apixaban (ELIQUIS) 5 MG TABS tablet Take 2.5 mg by mouth 2 (two) times daily. 06/11/20  Yes [provider]  brinzolamide (AZOPT) 1 % ophthalmic suspension Place 1 drop into the right eye 2 (two) times daily.   Yes [provider]  Budeson-Glycopyrrol-Formoterol (BREZTRI AEROSPHERE) 160-9-4.8 MCG/ACT AERO Inhale 2 puffs into the lungs in the morning and at bedtime.   Yes [provider]  Cholecalciferol 25 MCG (1000 UT) tablet Take 1,000 Units by mouth daily.   Yes [provider]  cycloSPORINE (RESTASIS) 0.05 % ophthalmic emulsion Place 1 drop into both eyes in the morning and at bedtime. 2x day every 12 hours.   Yes [provider]  EPINEPHrine 0.3 mg/0.3 mL IJ SOAJ injection Inject 0.3 mg into the muscle as needed for anaphylaxis. 02/21/20  Yes [provider]  OXYGEN Inhale 2 L into  the lungs at bedtime.   Yes [provider]  pantoprazole (PROTONIX) 40 MG tablet Take 1 tablet (40 mg total) by mouth 2 (two) times daily. 06/26/20  Yes Jackquline Denmark, MD  polyethylene glycol [facility]avel[facility]) packet Take 17 g by mouth daily as needed for constipation. 02/08/15  Yes [provider]  promethazine (PHENERGAN) 25 MG tablet Take 1 tablet (25 mg total) by mouth every 6 (six) hours as needed for nausea or vomiting. 06/26/20  Yes Jackquline Denmark, MD  rosuvastatin (CRESTOR) 40 MG tablet Take 40 mg by  mouth daily.   Yes [provider]  albuterol (VENTOLIN HFA) 108 (90 Base) MCG/ACT inhaler Inhale 2 puffs into the lungs every 6 (six) hours as needed for wheezing or shortness of breath.    [provider]  cyclobenzaprine (FLEXERIL) 5 MG tablet Take 5 mg by mouth 3 (three) times daily as needed. (back muscle pain) 12/19/15   [provider]  ipratropium-albuterol (DUONEB) 0.5-2.5 (3) MG/3ML SOLN Take 3 mLs by nebulization every 6 (six) hours as needed (asthma).    [provider]  sodium chloride 0.9 % nebulizer solution Take 3 mLs by nebulization as needed for wheezing. 12/03/19   [provider]     Family History  Problem Relation Age of Onset  . Diabetes Sister   . Diabetes Brother   . Lung cancer Brother   . Diabetes Father   . Pancreatic cancer Father   . Coronary artery disease Mother 73  . Thyroid cancer Daughter   . Colon cancer Neg Hx   . Esophageal cancer Neg Hx   . Stomach cancer Neg Hx   . Rectal cancer Neg Hx     Social History   Socioeconomic History  . Marital status: Married    Spouse name: Not on file  . Number of children: 5  . Years of education: Not on file  . Highest education level: Not on file  Occupational History  . Not on file  Tobacco Use  . Smoking status: Former Smoker    Packs/day: 1.00    Types: Cigarettes    Quit date: 02/01/2015    Years since quitting: 5.4  . Smokeless tobacco: Never Used  Vaping Use  . Vaping Use: Former  . Devices: Tried it before for a week  Substance and Sexual Activity  . Alcohol use: No    Alcohol/week: 0.0 standard drinks  . Drug use: No  . Sexual activity: Not on file  Other Topics Concern  . Not on file  Social History Narrative  . Not on file   Social Determinants of Health   Financial Resource Strain: Not on file  Food Insecurity: Not on file  Transportation Needs: Not on file  Physical Activity: Not on file  Stress: Not on file  Social Connections: Not  on file    Review of Systems: A 12 point ROS discussed and pertinent positives are indicated in the HPI above.  All other systems are negative.  Review of Systems  Constitutional: Positive for activity change, appetite change, fatigue and unexpected weight change.  Respiratory: Negative for cough and shortness of breath.   Cardiovascular: Negative for chest pain.  Gastrointestinal: Positive for nausea and vomiting.  Neurological: Positive for weakness.  Psychiatric/Behavioral: Negative for behavioral problems and confusion.    Vital Signs: BP 101/69   Pulse (!) 114   Temp 98.1 F (36.7 C) (Oral)   Ht 5\' 5"  (1.651 m)   Wt 144  lb (65.3 kg)   SpO2 99%   BMI 23.96 kg/m   Physical Exam Vitals reviewed.  HENT:     Mouth/Throat:     Mouth: Mucous membranes are moist.  Cardiovascular:     Rate and Rhythm: Normal rate and regular rhythm.     Heart sounds: Normal heart sounds.  Pulmonary:     Effort: Pulmonary effort is normal.     Breath sounds: Normal breath sounds.  Abdominal:     Palpations: Abdomen is soft.  Musculoskeletal:        General: Normal range of motion.  Skin:    General: Skin is warm.  Neurological:     Mental Status: She is alert and oriented to person, place, and time.  Psychiatric:        Behavior: Behavior normal.     Imaging: No results found.  Labs:  CBC: Recent Labs    07/01/20 1134  WBC 5.7  HGB 12.5  HCT 37.6  PLT 398    COAGS: Recent Labs    07/01/20 1134  INR 1.2    BMP: Recent Labs    08/29/19 0947  NA 142  K 4.4  CL 105  CO2 22  GLUCOSE 94  BUN 14  CALCIUM 9.6  CREATININE 0.96  GFRNONAA 59*  GFRAA 68    LIVER FUNCTION TESTS: Recent Labs    08/29/19 0947  BILITOT 0.2  AST 13  ALT 13  ALKPHOS 88  PROT 7.4  ALBUMIN 4.6    TUMOR MARKERS: No results for input(s): AFPTM, CEA, CA199, CHROMGRNA in the last 8760 hours.  Assessment and Plan:  Hx Bilat Lung cancer; 2016 and 2020 New wt loss; N/V CT  revealing enlarged Lymphadenopathy Scheduled now for left inguinal Lymph node biopsy Risks and benefits of left inguinal LN Bx was discussed with the patient and/or patient's family including, but not limited to bleeding, infection, damage to adjacent structures or low yield requiring additional tests.  All of the questions were answered and there is agreement to proceed. Consent signed and in chart.    Thank you for this interesting consult.  I greatly enjoyed meeting Bailey Nguyen and look forward to participating in their care.  A copy of this report was sent to the requesting provider on this date.  Electronically Signed: Lavonia Drafts, PA-C 07/01/2020, 12:49 PM   I spent a total of  30 Minutes   in face to face in clinical consultation, greater than 50% of which was counseling/coordinating care for left inguinal LN bx

## 2020-07-05 ENCOUNTER — Ambulatory Visit (INDEPENDENT_AMBULATORY_CARE_PROVIDER_SITE_OTHER): Payer: Medicare Other | Admitting: Gastroenterology

## 2020-07-05 ENCOUNTER — Encounter: Payer: Self-pay | Admitting: Gastroenterology

## 2020-07-05 VITALS — BP 112/64 | HR 113 | Ht 65.0 in | Wt 146.5 lb

## 2020-07-05 DIAGNOSIS — K219 Gastro-esophageal reflux disease without esophagitis: Secondary | ICD-10-CM

## 2020-07-05 DIAGNOSIS — R634 Abnormal weight loss: Secondary | ICD-10-CM

## 2020-07-05 DIAGNOSIS — K449 Diaphragmatic hernia without obstruction or gangrene: Secondary | ICD-10-CM

## 2020-07-05 DIAGNOSIS — R112 Nausea with vomiting, unspecified: Secondary | ICD-10-CM

## 2020-07-05 MED ORDER — PROMETHAZINE HCL 25 MG PO TABS: 25.0000 mg | ORAL_TABLET | Freq: Four times a day (QID) | ORAL | 2 refills | Status: DC | PRN

## 2020-07-05 NOTE — H&P (View-Only) (Signed)
Chief Complaint: FU  Referring Provider:  Leonides Sake, MD      ASSESSMENT AND PLAN;   #1.  N/V with wt loss. CT AP 05/25/2020 concerning for metastatic disease (recurrence of lung CA). Awaiting IR LN biopsy results.  #2. GERD with small HH with dysphagia s/p multiple esophageal dilatations previously.  #3. Associated COPD on home O2, 3V CAD, chronic neck and low back pain, HLD, anxiety.  #4. H/O Lung AdenoCa s/p RUL lobectomy 01/2015, another lung adeno CA  LLL lobe s/p wedge resection 07/2018.   Plan: -Continue Protonix 40mg  po bid #60, 11 refills -Continue Pherergan 25mg  po q6hrs prn # 30 2 refills. -EGD with dil at [facility].  I have discussed risks and benefits. -She adamantly wants to hold off on colonoscopy.  She has Cologuard test at home now.  Willing to do that.   HPI:    Bailey Nguyen is a 75 y.o. female  Follow-up With H/O COPD on O2, Ca lung as above, off Eliquis d/t nosebleeds.  Was seen here less than 2 weeks ago.  Had undergone left inguinal lymph node biopsy.  Results are pending.  Continues to have intermittent N/V with occasional dysphagia.  Phenergan helps.  Advised EGD.  Denies having any heartburn, regurgitation, odynophagia.  No fever or chills.  She underwent high-resolution CT chest on 06/07/2020 which did not show any definite recurrence.   Denies having any significant lower GI problems except for constipation which is better on MiraLAX.  No melena or hematochezia.  She adamantly wants to hold off on colonoscopy.  She has been ordered Cologuard test which she has it at home.  She has not done it yet.  He does understand if Cologuard is positive, she will have to undergo colonoscopy.  Has lost weight as below Wt Readings from Last 3 Encounters:  07/05/20 146 lb 8 oz (66.5 kg)  07/01/20 144 lb (65.3 kg)  06/26/20 143 lb (64.9 kg)       From previous records:  CT Abdo/pelvis with contrast 05/25/2020 at [place] enlarged left  inguinal and left external iliac lymph nodes concerning for metastatic disease.  Otherwise no intra-abdominal abnormalities. -Diverticulosis -Small low-attenuation areas in the liver as before.  CT chest 06/07/20 No pulmonary embolism.   Interval development of extensive pulmonary infiltrate compatible  with given history of COVID-19 pneumonia predominantly throughout  the left lung. Associated shotty mediastinal and left hilar  adenopathy is reactive in nature.   Moderate to severe emphysema.   Aortic Atherosclerosis (ICD10-I70.0) and Emphysema (ICD10-J43.9).     -dysphagia mostly to solids, no problems with liquids, mid chest. S/P EGDs with dil in 2013, 2016 and 2018, 2020 with good relief Had colonoscopy 09/2013-limited due to preparation, grossly negative except for moderate sigmoid diverticulosis and internal hemorrhoids. Had epigastric pain in August 2019, went to [facility], underwent CT scan of the abdomen pelvis which was unremarkable.  Does not want repeat colonoscopy.  CT Chest 04/2019 1. No evidence of local tumor recurrence status post right upper lobectomy and medial left lower lobe wedge resection. 2. No findings of metastatic disease in the chest. 3. Three-vessel coronary atherosclerosis.  PET 06/2018 Abdomen/Pelvis: - Liver: Multiple subcentimeter hepatic hypodensities are unchanged from 2016 activity and exhibit no FDG uptake. Scattered parenchymal calcifications are noted. - Gallbladder: Surgically absent. - Spleen: No splenomegaly. No focal abnormalities. - Pancreas: No focal abnormality - Adrenal glands: Unremarkable - Kidneys: Unremarkable - GI Tract: Colonic diverticulosis. - GU Tract:  Unremarkable - Adenopathy: None  Past Medical History:  Diagnosis Date  . Anxiety   . Back ache   . Cataract    il cataracts removed  . Chest wall pain   . Chronic constipation   . COPD (chronic obstructive pulmonary disease) ([place])   . Coronary artery  calcification seen on CT scan 01/01/2016  . Diverticula of intestine    sigmond  . Dyskinesia of esophagus   . Emphysema of lung ([place])   . GERD (gastroesophageal reflux disease)   . Glaucoma   . Hx of colonic polyps   . Hx of Lyme disease   . Hx of TIA (transient ischemic attack) and stroke   . Hypercholesteremia   . Lung cancer ([place])    Removed upper right portion of lung   . Neck pain   . Osteoporosis   . Prediabetes   . Splenic infarct   . Stroke [facility])    TIA  . Tobacco use disorder     Past Surgical History:  Procedure Laterality Date  . BACK SURGERY    . CHOLECYSTECTOMY    . COLONOSCOPY  09/22/2013   Moderate predominantly sigmoid diverticulosis. Moderate internal hemorrhoids. Otherwise normal colonoscopy. Limited due to qualityt of prep/.  . ESOPHAGOGASTRODUODENOSCOPY  03/05/2017   Peptic esophageal stricture. Gastritis.   Marland Kitchen LUNG REMOVAL, PARTIAL Right 01/2015  . LUNG SURGERY Left 07/24/2018  . TUBAL LIGATION      Family History  Problem Relation Age of Onset  . Diabetes Sister   . Diabetes Brother   . Lung cancer Brother   . Diabetes Father   . Pancreatic cancer Father   . Coronary artery disease Mother 10  . Thyroid cancer Daughter   . Colon cancer Neg Hx   . Esophageal cancer Neg Hx   . Stomach cancer Neg Hx   . Rectal cancer Neg Hx     Social History   Tobacco Use  . Smoking status: Former Smoker    Packs/day: 1.00    Types: Cigarettes    Quit date: 02/01/2015    Years since quitting: 5.4  . Smokeless tobacco: Never Used  Vaping Use  . Vaping Use: Former  . Devices: Tried it before for a week  Substance Use Topics  . Alcohol use: No    Alcohol/week: 0.0 standard drinks  . Drug use: No    Current Outpatient Medications  Medication Sig Dispense Refill  . albuterol (VENTOLIN HFA) 108 (90 Base) MCG/ACT inhaler Inhale 2 puffs into the lungs every 6 (six) hours as needed for wheezing or shortness of breath.    . brinzolamide (AZOPT) 1 %  ophthalmic suspension Place 1 drop into the right eye 2 (two) times daily.    . Budeson-Glycopyrrol-Formoterol (BREZTRI AEROSPHERE) 160-9-4.8 MCG/ACT AERO Inhale 2 puffs into the lungs in the morning and at bedtime.    . Cholecalciferol 25 MCG (1000 UT) tablet Take 1,000 Units by mouth daily.    . cyclobenzaprine (FLEXERIL) 5 MG tablet Take 5 mg by mouth 3 (three) times daily as needed. (back muscle pain)  1  . cycloSPORINE (RESTASIS) 0.05 % ophthalmic emulsion Place 1 drop into both eyes in the morning and at bedtime. 2x day every 12 hours.    Marland Kitchen EPINEPHrine 0.3 mg/0.3 mL IJ SOAJ injection Inject 0.3 mg into the muscle as needed for anaphylaxis.    Marland Kitchen ipratropium-albuterol (DUONEB) 0.5-2.5 (3) MG/3ML SOLN Take 3 mLs by nebulization every 6 (six) hours as needed (asthma).    Marland Kitchen  OXYGEN Inhale 2 L into the lungs at bedtime.    . pantoprazole (PROTONIX) 40 MG tablet Take 1 tablet (40 mg total) by mouth 2 (two) times daily. 60 tablet 11  . polyethylene glycol (MIRALAX / GLYCOLAX) packet Take 17 g by mouth daily as needed for constipation.    . promethazine (PHENERGAN) 25 MG tablet Take 1 tablet (25 mg total) by mouth every 6 (six) hours as needed for nausea or vomiting. 30 tablet 2  . rosuvastatin (CRESTOR) 40 MG tablet Take 40 mg by mouth daily.    . sodium chloride 0.9 % nebulizer solution Take 3 mLs by nebulization as needed for wheezing.    Marland Kitchen apixaban (ELIQUIS) 5 MG TABS tablet Take 2.5 mg by mouth 2 (two) times daily. (Patient not taking: Reported on 07/05/2020)     No current facility-administered medications for this visit.    No Known Allergies  Review of Systems:  Negative except for HPI.     Physical Exam:    BP 112/64 (BP Location: Right Arm, Patient Position: Sitting, Cuff Size: Normal)   Pulse (!) 113   Ht 5\' 5"  (1.651 m)   Wt 146 lb 8 oz (66.5 kg)   BMI 24.38 kg/m  Filed Weights   07/05/20 1542  Weight: 146 lb 8 oz (66.5 kg)   Constitutional:  Gets dyspneic on minimal  exertion.  Cachectic female Psychiatric: Normal mood and affect. Behavior is normal. HEENT: Pupils normal.  Conjunctivae are normal. No scleral icterus.  No thrush. Neck supple.  Cardiovascular: Normal rate, regular rhythm. No edema Pulmonary/chest: Bilateral decreased breath sounds.  No wheezing or rhonchi. Abdominal: Soft, nondistended. Nontender. Bowel sounds active throughout. There are no masses palpable. No hepatomegaly. Rectal:  defered Neurological: Alert and oriented to person place and time. Skin: Skin is warm and dry. No rashes noted.  Data Reviewed: I have personally reviewed following labs and imaging studies  CBC: CBC Latest Ref Rng & Units 07/01/2020 07/02/2008 07/22/2007  WBC 4.0 - 10.5 K/uL 5.7 - 6.3  Hemoglobin 12.0 - 15.0 g/dL 12.5 15.6(H) 14.5  Hematocrit 36.0 - 46.0 % 37.6 46.0 41.6  Platelets 150 - 400 K/uL 398 - 277    CMP: CMP Latest Ref Rng & Units 08/29/2019 07/02/2008 07/22/2007  Glucose 65 - 99 mg/dL 94 102(H) 74  BUN 8 - 27 mg/dL 14 17 14   Creatinine 0.57 - 1.00 mg/dL 0.96 0.6 0.85  Sodium 134 - 144 mmol/L 142 142 141  Potassium 3.5 - 5.2 mmol/L 4.4 3.9 3.9  Chloride 96 - 106 mmol/L 105 110 107  CO2 20 - 29 mmol/L 22 - 26  Calcium 8.7 - 10.3 mg/dL 9.6 - 9.0  Total Protein 6.0 - 8.5 g/dL 7.4 - -  Total Bilirubin 0.0 - 1.2 mg/dL 0.2 - -  Alkaline Phos 39 - 117 IU/L 88 - -  AST 0 - 40 IU/L 13 - -  ALT 0 - 32 IU/L 13 - -      Carmell Austria, MD 07/05/2020, 3:57 PM  Cc: Leonides Sake, MD

## 2020-07-05 NOTE — Progress Notes (Signed)
Chief Complaint: FU  Referring Provider:  Leonides Sake, MD      ASSESSMENT AND PLAN;   #1.  N/V with wt loss. CT AP 05/25/2020 concerning for metastatic disease (recurrence of lung CA). Awaiting IR LN biopsy results.  #2. GERD with small HH with dysphagia s/p multiple esophageal dilatations previously.  #3. Associated COPD on home O2, 3V CAD, chronic neck and low back pain, HLD, anxiety.  #4. H/O Lung AdenoCa s/p RUL lobectomy 01/2015, another lung adeno CA  LLL lobe s/p wedge resection 07/2018.   Plan: -Continue Protonix 40mg  po bid #60, 11 refills -Continue Pherergan 25mg  po q6hrs prn # 30 2 refills. -EGD with dil at [facility].  I have discussed risks and benefits. -She adamantly wants to hold off on colonoscopy.  She has Cologuard test at home now.  Willing to do that.   HPI:    Bailey Nguyen is a 75 y.o. female  Follow-up With H/O COPD on O2, Ca lung as above, off Eliquis d/t nosebleeds.  Was seen here less than 2 weeks ago.  Had undergone left inguinal lymph node biopsy.  Results are pending.  Continues to have intermittent N/V with occasional dysphagia.  Phenergan helps.  Advised EGD.  Denies having any heartburn, regurgitation, odynophagia.  No fever or chills.  She underwent high-resolution CT chest on 06/07/2020 which did not show any definite recurrence.   Denies having any significant lower GI problems except for constipation which is better on MiraLAX.  No melena or hematochezia.  She adamantly wants to hold off on colonoscopy.  She has been ordered Cologuard test which she has it at home.  She has not done it yet.  He does understand if Cologuard is positive, she will have to undergo colonoscopy.  Has lost weight as below Wt Readings from Last 3 Encounters:  07/05/20 146 lb 8 oz (66.5 kg)  07/01/20 144 lb (65.3 kg)  06/26/20 143 lb (64.9 kg)       From previous records:  CT Abdo/pelvis with contrast 05/25/2020 at [place] enlarged left  inguinal and left external iliac lymph nodes concerning for metastatic disease.  Otherwise no intra-abdominal abnormalities. -Diverticulosis -Small low-attenuation areas in the liver as before.  CT chest 06/07/20 No pulmonary embolism.   Interval development of extensive pulmonary infiltrate compatible  with given history of COVID-19 pneumonia predominantly throughout  the left lung. Associated shotty mediastinal and left hilar  adenopathy is reactive in nature.   Moderate to severe emphysema.   Aortic Atherosclerosis (ICD10-I70.0) and Emphysema (ICD10-J43.9).     -dysphagia mostly to solids, no problems with liquids, mid chest. S/P EGDs with dil in 2013, 2016 and 2018, 2020 with good relief Had colonoscopy 09/2013-limited due to preparation, grossly negative except for moderate sigmoid diverticulosis and internal hemorrhoids. Had epigastric pain in August 2019, went to [facility], underwent CT scan of the abdomen pelvis which was unremarkable.  Does not want repeat colonoscopy.  CT Chest 04/2019 1. No evidence of local tumor recurrence status post right upper lobectomy and medial left lower lobe wedge resection. 2. No findings of metastatic disease in the chest. 3. Three-vessel coronary atherosclerosis.  PET 06/2018 Abdomen/Pelvis: - Liver: Multiple subcentimeter hepatic hypodensities are unchanged from 2016 activity and exhibit no FDG uptake. Scattered parenchymal calcifications are noted. - Gallbladder: Surgically absent. - Spleen: No splenomegaly. No focal abnormalities. - Pancreas: No focal abnormality - Adrenal glands: Unremarkable - Kidneys: Unremarkable - GI Tract: Colonic diverticulosis. - GU Tract:  Unremarkable - Adenopathy: None  Past Medical History:  Diagnosis Date  . Anxiety   . Back ache   . Cataract    il cataracts removed  . Chest wall pain   . Chronic constipation   . COPD (chronic obstructive pulmonary disease) ([place])   . Coronary artery  calcification seen on CT scan 01/01/2016  . Diverticula of intestine    sigmond  . Dyskinesia of esophagus   . Emphysema of lung ([place])   . GERD (gastroesophageal reflux disease)   . Glaucoma   . Hx of colonic polyps   . Hx of Lyme disease   . Hx of TIA (transient ischemic attack) and stroke   . Hypercholesteremia   . Lung cancer ([place])    Removed upper right portion of lung   . Neck pain   . Osteoporosis   . Prediabetes   . Splenic infarct   . Stroke [facility])    TIA  . Tobacco use disorder     Past Surgical History:  Procedure Laterality Date  . BACK SURGERY    . CHOLECYSTECTOMY    . COLONOSCOPY  09/22/2013   Moderate predominantly sigmoid diverticulosis. Moderate internal hemorrhoids. Otherwise normal colonoscopy. Limited due to qualityt of prep/.  . ESOPHAGOGASTRODUODENOSCOPY  03/05/2017   Peptic esophageal stricture. Gastritis.   Marland Kitchen LUNG REMOVAL, PARTIAL Right 01/2015  . LUNG SURGERY Left 07/24/2018  . TUBAL LIGATION      Family History  Problem Relation Age of Onset  . Diabetes Sister   . Diabetes Brother   . Lung cancer Brother   . Diabetes Father   . Pancreatic cancer Father   . Coronary artery disease Mother 4  . Thyroid cancer Daughter   . Colon cancer Neg Hx   . Esophageal cancer Neg Hx   . Stomach cancer Neg Hx   . Rectal cancer Neg Hx     Social History   Tobacco Use  . Smoking status: Former Smoker    Packs/day: 1.00    Types: Cigarettes    Quit date: 02/01/2015    Years since quitting: 5.4  . Smokeless tobacco: Never Used  Vaping Use  . Vaping Use: Former  . Devices: Tried it before for a week  Substance Use Topics  . Alcohol use: No    Alcohol/week: 0.0 standard drinks  . Drug use: No    Current Outpatient Medications  Medication Sig Dispense Refill  . albuterol (VENTOLIN HFA) 108 (90 Base) MCG/ACT inhaler Inhale 2 puffs into the lungs every 6 (six) hours as needed for wheezing or shortness of breath.    . brinzolamide (AZOPT) 1 %  ophthalmic suspension Place 1 drop into the right eye 2 (two) times daily.    . Budeson-Glycopyrrol-Formoterol (BREZTRI AEROSPHERE) 160-9-4.8 MCG/ACT AERO Inhale 2 puffs into the lungs in the morning and at bedtime.    . Cholecalciferol 25 MCG (1000 UT) tablet Take 1,000 Units by mouth daily.    . cyclobenzaprine (FLEXERIL) 5 MG tablet Take 5 mg by mouth 3 (three) times daily as needed. (back muscle pain)  1  . cycloSPORINE (RESTASIS) 0.05 % ophthalmic emulsion Place 1 drop into both eyes in the morning and at bedtime. 2x day every 12 hours.    Marland Kitchen EPINEPHrine 0.3 mg/0.3 mL IJ SOAJ injection Inject 0.3 mg into the muscle as needed for anaphylaxis.    Marland Kitchen ipratropium-albuterol (DUONEB) 0.5-2.5 (3) MG/3ML SOLN Take 3 mLs by nebulization every 6 (six) hours as needed (asthma).    Marland Kitchen  OXYGEN Inhale 2 L into the lungs at bedtime.    . pantoprazole (PROTONIX) 40 MG tablet Take 1 tablet (40 mg total) by mouth 2 (two) times daily. 60 tablet 11  . polyethylene glycol (MIRALAX / GLYCOLAX) packet Take 17 g by mouth daily as needed for constipation.    . promethazine (PHENERGAN) 25 MG tablet Take 1 tablet (25 mg total) by mouth every 6 (six) hours as needed for nausea or vomiting. 30 tablet 2  . rosuvastatin (CRESTOR) 40 MG tablet Take 40 mg by mouth daily.    . sodium chloride 0.9 % nebulizer solution Take 3 mLs by nebulization as needed for wheezing.    Marland Kitchen apixaban (ELIQUIS) 5 MG TABS tablet Take 2.5 mg by mouth 2 (two) times daily. (Patient not taking: Reported on 07/05/2020)     No current facility-administered medications for this visit.    No Known Allergies  Review of Systems:  Negative except for HPI.     Physical Exam:    BP 112/64 (BP Location: Right Arm, Patient Position: Sitting, Cuff Size: Normal)   Pulse (!) 113   Ht 5\' 5"  (8.657 m)   Wt 146 lb 8 oz (66.5 kg)   BMI 24.38 kg/m  Filed Weights   07/05/20 1542  Weight: 146 lb 8 oz (66.5 kg)   Constitutional:  Gets dyspneic on minimal  exertion.  Cachectic female Psychiatric: Normal mood and affect. Behavior is normal. HEENT: Pupils normal.  Conjunctivae are normal. No scleral icterus.  No thrush. Neck supple.  Cardiovascular: Normal rate, regular rhythm. No edema Pulmonary/chest: Bilateral decreased breath sounds.  No wheezing or rhonchi. Abdominal: Soft, nondistended. Nontender. Bowel sounds active throughout. There are no masses palpable. No hepatomegaly. Rectal:  defered Neurological: Alert and oriented to person place and time. Skin: Skin is warm and dry. No rashes noted.  Data Reviewed: I have personally reviewed following labs and imaging studies  CBC: CBC Latest Ref Rng & Units 07/01/2020 07/02/2008 07/22/2007  WBC 4.0 - 10.5 K/uL 5.7 - 6.3  Hemoglobin 12.0 - 15.0 g/dL 12.5 15.6(H) 14.5  Hematocrit 36.0 - 46.0 % 37.6 46.0 41.6  Platelets 150 - 400 K/uL 398 - 277    CMP: CMP Latest Ref Rng & Units 08/29/2019 07/02/2008 07/22/2007  Glucose 65 - 99 mg/dL 94 102(H) 74  BUN 8 - 27 mg/dL 14 17 14   Creatinine 0.57 - 1.00 mg/dL 0.96 0.6 0.85  Sodium 134 - 144 mmol/L 142 142 141  Potassium 3.5 - 5.2 mmol/L 4.4 3.9 3.9  Chloride 96 - 106 mmol/L 105 110 107  CO2 20 - 29 mmol/L 22 - 26  Calcium 8.7 - 10.3 mg/dL 9.6 - 9.0  Total Protein 6.0 - 8.5 g/dL 7.4 - -  Total Bilirubin 0.0 - 1.2 mg/dL 0.2 - -  Alkaline Phos 39 - 117 IU/L 88 - -  AST 0 - 40 IU/L 13 - -  ALT 0 - 32 IU/L 13 - -      Carmell Austria, MD 07/05/2020, 3:57 PM  Cc: Leonides Sake, MD

## 2020-07-05 NOTE — Patient Instructions (Addendum)
If you are age 74 or older, your body mass index should be between 23-30. Your Body mass index is 24.38 kg/m. If this is out of the aforementioned range listed, please consider follow up with your Primary Care Provider.  If you are age 53 or younger, your body mass index should be between 19-25. Your Body mass index is 24.38 kg/m. If this is out of the aformentioned range listed, please consider follow up with your Primary Care Provider.   You have been scheduled for an endoscopy. Please follow written instructions given to you at your visit today. If you use inhalers (even only as needed), please bring them with you on the day of your procedure.     Due to recent COVID-19 restrictions implemented by our local and state authorities and in an effort to keep both patients and staff as safe as possible, our hospital system now requires COVID-19 testing prior to any scheduled hospital procedure. Please go to [place], [place], [place] [id number] on 07/06/2020 at  11:20am . This is a drive up testing site, you will not need to exit your vehicle.  You will not be billed at the time of testing but may receive a bill later depending on your insurance. The approximate cost of the test is $100. You must agree to quarantine from the time of your testing until the procedure date on 07/09/2020. This should include staying at home with ONLY the people you live with. Avoid take-out, grocery store shopping or leaving the house for any non-emergent reason. Failure to have your COVID-19 test done on the date and time you have been scheduled will result in cancellation of procedure. Please call our office at [phone] if you have any questions.   Thank you,  Dr. Jackquline Denmark

## 2020-07-06 ENCOUNTER — Other Ambulatory Visit (HOSPITAL_COMMUNITY)
Admission: RE | Admit: 2020-07-06 | Discharge: 2020-07-06 | Disposition: A | Discharge status: Home / Self Care | Payer: Medicare Other | Source: Ambulatory Visit | Attending: Gastroenterology | Admitting: Gastroenterology

## 2020-07-06 DIAGNOSIS — Z20822 Contact with and (suspected) exposure to covid-19: Secondary | ICD-10-CM | POA: Diagnosis not present

## 2020-07-06 DIAGNOSIS — Z01812 Encounter for preprocedural laboratory examination: Secondary | ICD-10-CM | POA: Insufficient documentation

## 2020-07-06 LAB — SARS CORONAVIRUS 2 (TAT 6-24 HRS): SARS Coronavirus 2: NEGATIVE

## 2020-07-08 ENCOUNTER — Other Ambulatory Visit: Payer: Self-pay

## 2020-07-08 ENCOUNTER — Encounter (HOSPITAL_COMMUNITY): Payer: Self-pay | Admitting: Gastroenterology

## 2020-07-08 LAB — SURGICAL PATHOLOGY

## 2020-07-08 NOTE — Anesthesia Preprocedure Evaluation (Addendum)
Anesthesia Evaluation  Patient identified by MRN, date of birth, ID band Patient awake    Reviewed: Allergy & Precautions, NPO status , Patient's Chart, lab work & pertinent test results  History of Anesthesia Complications Negative for: history of anesthetic complications  Airway Mallampati: I  TM Distance: >3 FB Neck ROM: Full    Dental  (+) Edentulous Upper, Edentulous Lower, Dental Advisory Given   Pulmonary COPD,  COPD inhaler, former smoker,  Hx Lung Ca, s/p RUL edge excision   Pulmonary exam normal        Cardiovascular negative cardio ROS Normal cardiovascular exam     Neuro/Psych TIA   GI/Hepatic negative GI ROS, Neg liver ROS,   Endo/Other  negative endocrine ROS  Renal/GU negative Renal ROS     Musculoskeletal negative musculoskeletal ROS (+)   Abdominal   Peds  Hematology negative hematology ROS (+)   Anesthesia Other Findings   Reproductive/Obstetrics                            Anesthesia Physical Anesthesia Plan  ASA: III  Anesthesia Plan: MAC   Post-op Pain Management:    Induction: Intravenous  PONV Risk Score and Plan: 2 and Ondansetron and Propofol infusion  Airway Management Planned: Natural Airway  Additional Equipment:   Intra-op Plan:   Post-operative Plan:   Informed Consent: I have reviewed the patients History and Physical, chart, labs and discussed the procedure including the risks, benefits and alternatives for the proposed anesthesia with the patient or authorized representative who has indicated his/her understanding and acceptance.     Dental advisory given  Plan Discussed with: Anesthesiologist and CRNA  Anesthesia Plan Comments:        Anesthesia Quick Evaluation

## 2020-07-09 ENCOUNTER — Ambulatory Visit (HOSPITAL_COMMUNITY): Payer: Medicare Other | Admitting: Anesthesiology

## 2020-07-09 ENCOUNTER — Encounter (HOSPITAL_COMMUNITY): Payer: Self-pay | Admitting: Gastroenterology

## 2020-07-09 ENCOUNTER — Ambulatory Visit (HOSPITAL_COMMUNITY)
Admission: RE | Admit: 2020-07-09 | Discharge: 2020-07-09 | Disposition: A | Discharge status: Home / Self Care | Payer: Medicare Other | Attending: Gastroenterology | Admitting: Gastroenterology

## 2020-07-09 ENCOUNTER — Encounter (HOSPITAL_COMMUNITY)
Admission: RE | Disposition: A | Discharge status: Home / Self Care | Payer: Self-pay | Source: Home / Self Care | Attending: Gastroenterology

## 2020-07-09 ENCOUNTER — Other Ambulatory Visit: Payer: Self-pay

## 2020-07-09 DIAGNOSIS — J439 Emphysema, unspecified: Secondary | ICD-10-CM | POA: Insufficient documentation

## 2020-07-09 DIAGNOSIS — K2970 Gastritis, unspecified, without bleeding: Secondary | ICD-10-CM

## 2020-07-09 DIAGNOSIS — E785 Hyperlipidemia, unspecified: Secondary | ICD-10-CM | POA: Insufficient documentation

## 2020-07-09 DIAGNOSIS — Z8673 Personal history of transient ischemic attack (TIA), and cerebral infarction without residual deficits: Secondary | ICD-10-CM | POA: Insufficient documentation

## 2020-07-09 DIAGNOSIS — Z79899 Other long term (current) drug therapy: Secondary | ICD-10-CM | POA: Insufficient documentation

## 2020-07-09 DIAGNOSIS — F419 Anxiety disorder, unspecified: Secondary | ICD-10-CM | POA: Diagnosis not present

## 2020-07-09 DIAGNOSIS — M545 Low back pain, unspecified: Secondary | ICD-10-CM | POA: Insufficient documentation

## 2020-07-09 DIAGNOSIS — Z902 Acquired absence of lung [part of]: Secondary | ICD-10-CM | POA: Insufficient documentation

## 2020-07-09 DIAGNOSIS — J449 Chronic obstructive pulmonary disease, unspecified: Secondary | ICD-10-CM | POA: Diagnosis not present

## 2020-07-09 DIAGNOSIS — K219 Gastro-esophageal reflux disease without esophagitis: Secondary | ICD-10-CM

## 2020-07-09 DIAGNOSIS — K3189 Other diseases of stomach and duodenum: Secondary | ICD-10-CM | POA: Diagnosis not present

## 2020-07-09 DIAGNOSIS — R590 Localized enlarged lymph nodes: Secondary | ICD-10-CM | POA: Diagnosis not present

## 2020-07-09 DIAGNOSIS — K5900 Constipation, unspecified: Secondary | ICD-10-CM | POA: Diagnosis not present

## 2020-07-09 DIAGNOSIS — K449 Diaphragmatic hernia without obstruction or gangrene: Secondary | ICD-10-CM | POA: Diagnosis not present

## 2020-07-09 DIAGNOSIS — Z9981 Dependence on supplemental oxygen: Secondary | ICD-10-CM | POA: Insufficient documentation

## 2020-07-09 DIAGNOSIS — Z87891 Personal history of nicotine dependence: Secondary | ICD-10-CM | POA: Diagnosis not present

## 2020-07-09 DIAGNOSIS — I2510 Atherosclerotic heart disease of native coronary artery without angina pectoris: Secondary | ICD-10-CM | POA: Diagnosis not present

## 2020-07-09 DIAGNOSIS — C4A9 Merkel cell carcinoma, unspecified: Secondary | ICD-10-CM | POA: Diagnosis not present

## 2020-07-09 DIAGNOSIS — C7B1 Secondary Merkel cell carcinoma: Secondary | ICD-10-CM | POA: Diagnosis not present

## 2020-07-09 DIAGNOSIS — M542 Cervicalgia: Secondary | ICD-10-CM | POA: Diagnosis not present

## 2020-07-09 DIAGNOSIS — K319 Disease of stomach and duodenum, unspecified: Secondary | ICD-10-CM | POA: Diagnosis not present

## 2020-07-09 DIAGNOSIS — R1310 Dysphagia, unspecified: Secondary | ICD-10-CM | POA: Diagnosis not present

## 2020-07-09 DIAGNOSIS — Z85118 Personal history of other malignant neoplasm of bronchus and lung: Secondary | ICD-10-CM | POA: Insufficient documentation

## 2020-07-09 DIAGNOSIS — R112 Nausea with vomiting, unspecified: Secondary | ICD-10-CM

## 2020-07-09 DIAGNOSIS — K2289 Other specified disease of esophagus: Secondary | ICD-10-CM | POA: Insufficient documentation

## 2020-07-09 DIAGNOSIS — R634 Abnormal weight loss: Secondary | ICD-10-CM | POA: Diagnosis not present

## 2020-07-09 DIAGNOSIS — R1110 Vomiting, unspecified: Secondary | ICD-10-CM

## 2020-07-09 DIAGNOSIS — Z6824 Body mass index (BMI) 24.0-24.9, adult: Secondary | ICD-10-CM | POA: Diagnosis not present

## 2020-07-09 DIAGNOSIS — I700 Atherosclerosis of aorta: Secondary | ICD-10-CM | POA: Diagnosis not present

## 2020-07-09 DIAGNOSIS — E7800 Pure hypercholesterolemia, unspecified: Secondary | ICD-10-CM | POA: Diagnosis not present

## 2020-07-09 HISTORY — PX: ESOPHAGOGASTRODUODENOSCOPY (EGD) WITH PROPOFOL: SHX5813

## 2020-07-09 HISTORY — PX: MALONEY DILATION: SHX5535

## 2020-07-09 HISTORY — PX: BIOPSY: SHX5522

## 2020-07-09 SURGERY — ESOPHAGOGASTRODUODENOSCOPY (EGD) WITH PROPOFOL
Anesthesia: Monitor Anesthesia Care

## 2020-07-09 MED ORDER — PROPOFOL 500 MG/50ML IV EMUL
INTRAVENOUS | Status: AC
  Filled 2020-07-09: qty 50

## 2020-07-09 MED ORDER — PROPOFOL 500 MG/50ML IV EMUL
INTRAVENOUS | Status: DC | PRN
  Administered 2020-07-09: 75 ug/kg/min via INTRAVENOUS

## 2020-07-09 MED ORDER — SODIUM CHLORIDE 0.9 % IV SOLN: INTRAVENOUS | Status: DC

## 2020-07-09 MED ORDER — LACTATED RINGERS IV SOLN: INTRAVENOUS | Status: DC | PRN

## 2020-07-09 MED ORDER — PROPOFOL 1000 MG/100ML IV EMUL
INTRAVENOUS | Status: AC
  Filled 2020-07-09: qty 100

## 2020-07-09 SURGICAL SUPPLY — 14 items

## 2020-07-09 NOTE — Op Note (Signed)
[facility] Patient Name: Bailey Nguyen Procedure Date: 07/09/2020 MRN: [id number] Attending MD: Jackquline Denmark , MD Date of Birth: 1946-05-04 CSN: [id number] Age: 75 Admit Type: Outpatient Procedure:                Upper GI endoscopy Indications:              Dysphagia. N/V. new Dx of Merkel cell carcinoma. Providers:                Jackquline Denmark, MD, Cleda Daub, RN, Laverda Sorenson,                            Technician, Eliberto Ivory, CRNA Referring MD:              Medicines:                Monitored Anesthesia Care Complications:            No immediate complications. Estimated Blood Loss:     Estimated blood loss: none. Procedure:                Pre-Anesthesia Assessment:                           - Prior to the procedure, a History and Physical                            was performed, and patient medications and                            allergies were reviewed. The patient's tolerance of                            previous anesthesia was also reviewed. The risks                            and benefits of the procedure and the sedation                            options and risks were discussed with the patient.                            All questions were answered, and informed consent                            was obtained. Prior Anticoagulants: The patient has                            taken Eliquis (apixaban), last dose was 5 days                            prior to procedure. ASA Grade Assessment: III - A                            patient with severe systemic disease. After  reviewing the risks and benefits, the patient was                            deemed in satisfactory condition to undergo the                            procedure.                           After obtaining informed consent, the endoscope was                            passed under direct vision. Throughout the                            procedure, the  patient's blood pressure, pulse, and                            oxygen saturations were monitored continuously. The                            GIF-H190 ([id number] Olympus gastroscope was                            introduced through the mouth, and advanced to the                            second part of duodenum. The upper GI endoscopy was                            accomplished without difficulty. The patient                            tolerated the procedure well. Scope In: Scope Out: Findings:      The esophagus was mildly torturous but normal. Z-line well-defined at 35       cm. No endoscopic abnormality was evident in the esophagus to explain       the patient's complaint of dysphagia. It was decided, however, to       proceed with dilation of the entire esophagus. The scope was withdrawn.       Dilation was performed with a Maloney dilator with mild resistance at 29       Fr and 54 Fr. Biopsies were obtained from the proximal and distal       esophagus with cold forceps for histology of suspected eosinophilic       esophagitis.      Diffuse moderate inflammation characterized by erythema was found in the       gastric body and in the gastric antrum. Biopsies were taken with a cold       forceps for histology. No outlet obstruction.      The examined duodenum was normal. Biopsies for histology were taken with       a cold forceps for evaluation of celiac disease. Impression:               -Moderate gastritis.                           -  Presbyesophagus s/p esophageal dilatation. Moderate Sedation:      Not Applicable - Patient had care per Anesthesia. Recommendation:           - Patient has a contact number available for                            emergencies. The signs and symptoms of potential                            delayed complications were discussed with the                            patient. Return to normal activities tomorrow.                            Written  discharge instructions were provided to the                            patient.                           - Post dilatation diet.                           - Continue present medications.                           - Resume Eliquis (apixaban) at prior dose tomorrow.                           - Await pathology results.                           - The findings and recommendations were discussed                            with the patient's family. Procedure Code(s):        --- Professional ---                           [phone], Esophagogastroduodenoscopy, flexible,                            transoral; with biopsy, single or multiple                           43450, Dilation of esophagus, by unguided sound or                            bougie, single or multiple passes Diagnosis Code(s):        --- Professional ---                           R13.10, Dysphagia, unspecified                           K29.70, Gastritis, unspecified, without  bleeding CPT copyright 2019 [employer]. All rights reserved. The codes documented in this report are preliminary and upon coder review may  be revised to meet current compliance requirements. Jackquline Denmark, MD 07/09/2020 8:08:47 AM This report has been signed electronically. Number of Addenda: 0

## 2020-07-09 NOTE — Transfer of Care (Signed)
Immediate Anesthesia Transfer of Care Note  Patient: Bailey Nguyen  Procedure(s) Performed: Procedure(s): ESOPHAGOGASTRODUODENOSCOPY (EGD) WITH PROPOFOL (N/A)  Patient Location: PACU and Endoscopy Unit  Anesthesia Type:MAC  Level of Consciousness: awake, alert  and oriented  Airway & Oxygen Therapy: Patient Spontanous Breathing and Patient connected to nasal cannula oxygen  Post-op Assessment: Report given to RN and Post -op Vital signs reviewed and stable  Post vital signs: Reviewed and stable  Last Vitals:  Vitals:   07/09/20 0645  BP: 134/71  Pulse: 90  Resp: (!) 22  Temp: (!) 36.4 C  SpO2: 30%    Complications: No apparent anesthesia complications

## 2020-07-09 NOTE — Interval H&P Note (Signed)
History and Physical Interval Note:  07/09/2020 7:36 AM  Bailey Nguyen  has presented today for surgery, with the diagnosis of N&V weight loss, gerd with small HH.  The various methods of treatment have been discussed with the patient and family. After consideration of risks, benefits and other options for treatment, the patient has consented to  Procedure(s): ESOPHAGOGASTRODUODENOSCOPY (EGD) WITH PROPOFOL (N/A) as a surgical intervention.  The patient's history has been reviewed, patient examined, no change in status, stable for surgery.  I have reviewed the patient's chart and labs.  Questions were answered to the patient's satisfaction.     Jackquline Denmark

## 2020-07-09 NOTE — Discharge Instructions (Signed)
YOU HAD AN ENDOSCOPIC PROCEDURE TODAY: Refer to the procedure report and other information in the discharge instructions given to you for any specific questions about what was found during the examination. If this information does not answer your questions, please call [place] office at [phone] to clarify.  ° °YOU SHOULD EXPECT: Some feelings of bloating in the abdomen. Passage of more gas than usual. Walking can help get rid of the air that was put into your GI tract during the procedure and reduce the bloating. If you had a lower endoscopy (such as a colonoscopy or flexible sigmoidoscopy) you may notice spotting of blood in your stool or on the toilet paper. Some abdominal soreness may be present for a day or two, also. ° °DIET: Your first meal following the procedure should be a light meal and then it is ok to progress to your normal diet. A half-sandwich or bowl of soup is an example of a good first meal. Heavy or fried foods are harder to digest and may make you feel nauseous or bloated. Drink plenty of fluids but you should avoid alcoholic beverages for 24 hours. If you had a esophageal dilation, please see attached instructions for diet.   ° °ACTIVITY: Your care partner should take you home directly after the procedure. You should plan to take it easy, moving slowly for the rest of the day. You can resume normal activity the day after the procedure however YOU SHOULD NOT DRIVE, use power tools, machinery or perform tasks that involve climbing or major physical exertion for 24 hours (because of the sedation medicines used during the test).  ° °SYMPTOMS TO REPORT IMMEDIATELY: °A gastroenterologist can be reached at any hour. Please call [phone]  for any of the following symptoms:  °Following lower endoscopy (colonoscopy, flexible sigmoidoscopy) °Excessive amounts of blood in the stool  °Significant tenderness, worsening of abdominal pains  °Swelling of the abdomen that is new, acute  °Fever of 100° or  higher  °Following upper endoscopy (EGD, EUS, ERCP, esophageal dilation) °Vomiting of blood or coffee ground material  °New, significant abdominal pain  °New, significant chest pain or pain under the shoulder blades  °Painful or persistently difficult swallowing  °New shortness of breath  °Black, tarry-looking or red, bloody stools ° °FOLLOW UP:  °If any biopsies were taken you will be contacted by phone or by letter within the next 1-3 weeks. Call [phone]  if you have not heard about the biopsies in 3 weeks.  °Please also call with any specific questions about appointments or follow up tests. ° °

## 2020-07-09 NOTE — Anesthesia Postprocedure Evaluation (Addendum)
Anesthesia Post Note  Patient: Bailey Nguyen  Procedure(s) Performed: ESOPHAGOGASTRODUODENOSCOPY (EGD) WITH PROPOFOL (N/A ) [place]     Patient location during evaluation: Endoscopy Anesthesia Type: MAC Level of consciousness: awake and alert Pain management: pain level controlled Vital Signs Assessment: post-procedure vital signs reviewed and stable Respiratory status: spontaneous breathing and respiratory function stable Cardiovascular status: stable Postop Assessment: no apparent nausea or vomiting Anesthetic complications: no   No complications documented.  Last Vitals:  Vitals:   07/09/20 0820 07/09/20 0830  BP: 110/65 122/71  Pulse: 80 82  Resp: 19 19  Temp:    SpO2: 95% 96%    Last Pain:  Vitals:   07/09/20 0830  TempSrc:   PainSc: 0-No pain                 SINGER,JAMES DANIEL

## 2020-07-09 NOTE — Addendum Note (Signed)
Addendum  created 07/09/20 1052 by Duane Boston, MD   Clinical Note Signed

## 2020-07-10 ENCOUNTER — Encounter: Payer: Self-pay | Admitting: Gastroenterology

## 2020-07-10 LAB — SURGICAL PATHOLOGY

## 2020-07-11 DIAGNOSIS — C4A9 Merkel cell carcinoma, unspecified: Secondary | ICD-10-CM | POA: Diagnosis not present

## 2020-07-11 DIAGNOSIS — J449 Chronic obstructive pulmonary disease, unspecified: Secondary | ICD-10-CM | POA: Diagnosis not present

## 2020-07-18 ENCOUNTER — Other Ambulatory Visit: Payer: Self-pay | Admitting: Gastroenterology

## 2020-07-19 DIAGNOSIS — C774 Secondary and unspecified malignant neoplasm of inguinal and lower limb lymph nodes: Secondary | ICD-10-CM | POA: Diagnosis not present

## 2020-07-19 DIAGNOSIS — Z85821 Personal history of Merkel cell carcinoma: Secondary | ICD-10-CM | POA: Diagnosis not present

## 2020-07-25 DIAGNOSIS — C7A8 Other malignant neuroendocrine tumors: Secondary | ICD-10-CM | POA: Diagnosis not present

## 2020-07-25 DIAGNOSIS — C4A9 Merkel cell carcinoma, unspecified: Secondary | ICD-10-CM | POA: Diagnosis not present

## 2020-07-29 DIAGNOSIS — C7B1 Secondary Merkel cell carcinoma: Secondary | ICD-10-CM | POA: Diagnosis not present

## 2020-07-29 DIAGNOSIS — C7A8 Other malignant neuroendocrine tumors: Secondary | ICD-10-CM | POA: Diagnosis not present

## 2020-07-29 DIAGNOSIS — R911 Solitary pulmonary nodule: Secondary | ICD-10-CM | POA: Diagnosis not present

## 2020-07-29 DIAGNOSIS — C4A9 Merkel cell carcinoma, unspecified: Secondary | ICD-10-CM | POA: Diagnosis not present

## 2020-07-31 DIAGNOSIS — Z79899 Other long term (current) drug therapy: Secondary | ICD-10-CM | POA: Diagnosis not present

## 2020-07-31 DIAGNOSIS — C4A9 Merkel cell carcinoma, unspecified: Secondary | ICD-10-CM | POA: Diagnosis not present

## 2020-07-31 DIAGNOSIS — R110 Nausea: Secondary | ICD-10-CM | POA: Diagnosis not present

## 2020-08-09 DIAGNOSIS — C4A9 Merkel cell carcinoma, unspecified: Secondary | ICD-10-CM | POA: Diagnosis not present

## 2020-08-09 DIAGNOSIS — Z66 Do not resuscitate: Secondary | ICD-10-CM | POA: Diagnosis not present

## 2020-08-09 DIAGNOSIS — E876 Hypokalemia: Secondary | ICD-10-CM | POA: Diagnosis not present

## 2020-08-09 DIAGNOSIS — Z79899 Other long term (current) drug therapy: Secondary | ICD-10-CM | POA: Diagnosis not present

## 2020-08-12 DIAGNOSIS — E876 Hypokalemia: Secondary | ICD-10-CM | POA: Diagnosis not present

## 2020-08-12 DIAGNOSIS — R202 Paresthesia of skin: Secondary | ICD-10-CM | POA: Diagnosis not present

## 2020-08-12 DIAGNOSIS — I2510 Atherosclerotic heart disease of native coronary artery without angina pectoris: Secondary | ICD-10-CM | POA: Diagnosis not present

## 2020-08-12 DIAGNOSIS — R1032 Left lower quadrant pain: Secondary | ICD-10-CM | POA: Diagnosis not present

## 2020-08-12 DIAGNOSIS — R600 Localized edema: Secondary | ICD-10-CM | POA: Diagnosis not present

## 2020-08-12 DIAGNOSIS — Z87891 Personal history of nicotine dependence: Secondary | ICD-10-CM | POA: Diagnosis not present

## 2020-08-12 DIAGNOSIS — C4A9 Merkel cell carcinoma, unspecified: Secondary | ICD-10-CM | POA: Diagnosis not present

## 2020-08-12 DIAGNOSIS — R590 Localized enlarged lymph nodes: Secondary | ICD-10-CM | POA: Diagnosis not present

## 2020-08-12 DIAGNOSIS — K219 Gastro-esophageal reflux disease without esophagitis: Secondary | ICD-10-CM | POA: Diagnosis not present

## 2020-08-12 DIAGNOSIS — R350 Frequency of micturition: Secondary | ICD-10-CM | POA: Diagnosis not present

## 2020-08-12 DIAGNOSIS — Z85118 Personal history of other malignant neoplasm of bronchus and lung: Secondary | ICD-10-CM | POA: Diagnosis not present

## 2020-08-12 DIAGNOSIS — Z7982 Long term (current) use of aspirin: Secondary | ICD-10-CM | POA: Diagnosis not present

## 2020-08-12 DIAGNOSIS — J449 Chronic obstructive pulmonary disease, unspecified: Secondary | ICD-10-CM | POA: Diagnosis not present

## 2020-08-12 DIAGNOSIS — R200 Anesthesia of skin: Secondary | ICD-10-CM | POA: Diagnosis not present

## 2020-08-15 DIAGNOSIS — Z66 Do not resuscitate: Secondary | ICD-10-CM | POA: Diagnosis not present

## 2020-08-15 DIAGNOSIS — C4A9 Merkel cell carcinoma, unspecified: Secondary | ICD-10-CM | POA: Diagnosis not present

## 2020-08-15 DIAGNOSIS — Z79899 Other long term (current) drug therapy: Secondary | ICD-10-CM | POA: Diagnosis not present

## 2020-08-15 DIAGNOSIS — Z5112 Encounter for antineoplastic immunotherapy: Secondary | ICD-10-CM | POA: Diagnosis not present

## 2020-08-17 DIAGNOSIS — R110 Nausea: Secondary | ICD-10-CM | POA: Diagnosis not present

## 2020-08-17 DIAGNOSIS — C4A9 Merkel cell carcinoma, unspecified: Secondary | ICD-10-CM | POA: Diagnosis not present

## 2020-08-17 DIAGNOSIS — G9389 Other specified disorders of brain: Secondary | ICD-10-CM | POA: Diagnosis not present

## 2020-08-21 ENCOUNTER — Ambulatory Visit: Payer: Medicare Other | Admitting: Gastroenterology

## 2020-08-22 DIAGNOSIS — J449 Chronic obstructive pulmonary disease, unspecified: Secondary | ICD-10-CM | POA: Diagnosis not present

## 2020-08-22 DIAGNOSIS — Z7982 Long term (current) use of aspirin: Secondary | ICD-10-CM | POA: Diagnosis not present

## 2020-08-22 DIAGNOSIS — C3490 Malignant neoplasm of unspecified part of unspecified bronchus or lung: Secondary | ICD-10-CM | POA: Diagnosis not present

## 2020-08-22 DIAGNOSIS — Z79899 Other long term (current) drug therapy: Secondary | ICD-10-CM | POA: Diagnosis not present

## 2020-08-22 DIAGNOSIS — K219 Gastro-esophageal reflux disease without esophagitis: Secondary | ICD-10-CM | POA: Diagnosis not present

## 2020-08-22 DIAGNOSIS — Z87891 Personal history of nicotine dependence: Secondary | ICD-10-CM | POA: Diagnosis not present

## 2020-08-22 DIAGNOSIS — B370 Candidal stomatitis: Secondary | ICD-10-CM | POA: Diagnosis not present

## 2020-08-22 DIAGNOSIS — Z8616 Personal history of COVID-19: Secondary | ICD-10-CM | POA: Diagnosis not present

## 2020-08-22 DIAGNOSIS — I2510 Atherosclerotic heart disease of native coronary artery without angina pectoris: Secondary | ICD-10-CM | POA: Diagnosis not present

## 2020-08-30 DIAGNOSIS — C4A9 Merkel cell carcinoma, unspecified: Secondary | ICD-10-CM | POA: Diagnosis not present

## 2020-08-30 DIAGNOSIS — R0602 Shortness of breath: Secondary | ICD-10-CM | POA: Diagnosis not present

## 2020-08-30 DIAGNOSIS — Z79899 Other long term (current) drug therapy: Secondary | ICD-10-CM | POA: Diagnosis not present

## 2020-08-30 DIAGNOSIS — R059 Cough, unspecified: Secondary | ICD-10-CM | POA: Diagnosis not present

## 2020-08-30 DIAGNOSIS — J449 Chronic obstructive pulmonary disease, unspecified: Secondary | ICD-10-CM | POA: Diagnosis not present

## 2020-08-30 DIAGNOSIS — Z66 Do not resuscitate: Secondary | ICD-10-CM | POA: Diagnosis not present

## 2020-08-30 DIAGNOSIS — B379 Candidiasis, unspecified: Secondary | ICD-10-CM | POA: Diagnosis not present

## 2020-08-30 DIAGNOSIS — R0600 Dyspnea, unspecified: Secondary | ICD-10-CM | POA: Diagnosis not present

## 2020-08-30 DIAGNOSIS — J90 Pleural effusion, not elsewhere classified: Secondary | ICD-10-CM | POA: Diagnosis not present

## 2020-08-30 DIAGNOSIS — Z9289 Personal history of other medical treatment: Secondary | ICD-10-CM | POA: Diagnosis not present

## 2020-09-06 DIAGNOSIS — Z5112 Encounter for antineoplastic immunotherapy: Secondary | ICD-10-CM | POA: Diagnosis not present

## 2020-09-06 DIAGNOSIS — C4A9 Merkel cell carcinoma, unspecified: Secondary | ICD-10-CM | POA: Diagnosis not present

## 2020-09-06 DIAGNOSIS — Z79899 Other long term (current) drug therapy: Secondary | ICD-10-CM | POA: Diagnosis not present

## 2020-09-06 DIAGNOSIS — Z66 Do not resuscitate: Secondary | ICD-10-CM | POA: Diagnosis not present

## 2020-09-10 DIAGNOSIS — Z7982 Long term (current) use of aspirin: Secondary | ICD-10-CM | POA: Diagnosis not present

## 2020-09-10 DIAGNOSIS — R000 Tachycardia, unspecified: Secondary | ICD-10-CM | POA: Diagnosis not present

## 2020-09-10 DIAGNOSIS — R0602 Shortness of breath: Secondary | ICD-10-CM | POA: Diagnosis not present

## 2020-09-10 DIAGNOSIS — Z87891 Personal history of nicotine dependence: Secondary | ICD-10-CM | POA: Diagnosis not present

## 2020-09-10 DIAGNOSIS — J441 Chronic obstructive pulmonary disease with (acute) exacerbation: Secondary | ICD-10-CM | POA: Diagnosis not present

## 2020-09-10 DIAGNOSIS — Z79899 Other long term (current) drug therapy: Secondary | ICD-10-CM | POA: Diagnosis not present

## 2020-09-10 DIAGNOSIS — K219 Gastro-esophageal reflux disease without esophagitis: Secondary | ICD-10-CM | POA: Diagnosis not present

## 2020-09-10 DIAGNOSIS — J439 Emphysema, unspecified: Secondary | ICD-10-CM | POA: Diagnosis not present

## 2020-09-10 DIAGNOSIS — I2510 Atherosclerotic heart disease of native coronary artery without angina pectoris: Secondary | ICD-10-CM | POA: Diagnosis not present

## 2020-09-10 DIAGNOSIS — R079 Chest pain, unspecified: Secondary | ICD-10-CM | POA: Diagnosis not present

## 2020-09-11 DIAGNOSIS — R0600 Dyspnea, unspecified: Secondary | ICD-10-CM | POA: Diagnosis not present

## 2020-09-11 DIAGNOSIS — J441 Chronic obstructive pulmonary disease with (acute) exacerbation: Secondary | ICD-10-CM | POA: Diagnosis not present

## 2020-09-23 ENCOUNTER — Telehealth: Payer: Self-pay | Admitting: Gastroenterology

## 2020-09-23 DIAGNOSIS — J449 Chronic obstructive pulmonary disease, unspecified: Secondary | ICD-10-CM | POA: Diagnosis not present

## 2020-09-23 DIAGNOSIS — Z87891 Personal history of nicotine dependence: Secondary | ICD-10-CM | POA: Diagnosis not present

## 2020-09-23 DIAGNOSIS — R339 Retention of urine, unspecified: Secondary | ICD-10-CM | POA: Diagnosis not present

## 2020-09-23 DIAGNOSIS — N3090 Cystitis, unspecified without hematuria: Secondary | ICD-10-CM | POA: Diagnosis not present

## 2020-09-23 DIAGNOSIS — I2510 Atherosclerotic heart disease of native coronary artery without angina pectoris: Secondary | ICD-10-CM | POA: Diagnosis not present

## 2020-09-23 NOTE — Telephone Encounter (Signed)
Pt scheduled to see Amy Esterwood PA 10/02/20@1 :30pm. Pt aware of appt.

## 2020-09-27 DIAGNOSIS — C4A9 Merkel cell carcinoma, unspecified: Secondary | ICD-10-CM | POA: Diagnosis not present

## 2020-09-27 DIAGNOSIS — Z79899 Other long term (current) drug therapy: Secondary | ICD-10-CM | POA: Diagnosis not present

## 2020-09-27 DIAGNOSIS — C4A59 Merkel cell carcinoma of other part of trunk: Secondary | ICD-10-CM | POA: Diagnosis not present

## 2020-09-27 DIAGNOSIS — Z5111 Encounter for antineoplastic chemotherapy: Secondary | ICD-10-CM | POA: Diagnosis not present

## 2020-10-02 ENCOUNTER — Ambulatory Visit: Payer: Medicare Other | Admitting: Physician Assistant

## 2020-10-04 DIAGNOSIS — Z6824 Body mass index (BMI) 24.0-24.9, adult: Secondary | ICD-10-CM | POA: Diagnosis not present

## 2020-10-04 DIAGNOSIS — E785 Hyperlipidemia, unspecified: Secondary | ICD-10-CM | POA: Diagnosis not present

## 2020-10-04 DIAGNOSIS — E7800 Pure hypercholesterolemia, unspecified: Secondary | ICD-10-CM | POA: Diagnosis not present

## 2020-10-04 DIAGNOSIS — K219 Gastro-esophageal reflux disease without esophagitis: Secondary | ICD-10-CM | POA: Diagnosis not present

## 2020-10-04 DIAGNOSIS — J449 Chronic obstructive pulmonary disease, unspecified: Secondary | ICD-10-CM | POA: Diagnosis not present

## 2020-10-04 DIAGNOSIS — B370 Candidal stomatitis: Secondary | ICD-10-CM | POA: Diagnosis not present

## 2020-10-04 DIAGNOSIS — R339 Retention of urine, unspecified: Secondary | ICD-10-CM | POA: Diagnosis not present

## 2020-10-04 DIAGNOSIS — E1169 Type 2 diabetes mellitus with other specified complication: Secondary | ICD-10-CM | POA: Diagnosis not present

## 2020-10-04 DIAGNOSIS — C7B1 Secondary Merkel cell carcinoma: Secondary | ICD-10-CM | POA: Diagnosis not present

## 2020-10-08 DIAGNOSIS — J439 Emphysema, unspecified: Secondary | ICD-10-CM | POA: Diagnosis not present

## 2020-10-08 DIAGNOSIS — B370 Candidal stomatitis: Secondary | ICD-10-CM | POA: Diagnosis not present

## 2020-10-08 DIAGNOSIS — Z85118 Personal history of other malignant neoplasm of bronchus and lung: Secondary | ICD-10-CM | POA: Diagnosis not present

## 2020-10-08 DIAGNOSIS — J441 Chronic obstructive pulmonary disease with (acute) exacerbation: Secondary | ICD-10-CM | POA: Diagnosis not present

## 2020-10-08 DIAGNOSIS — R21 Rash and other nonspecific skin eruption: Secondary | ICD-10-CM | POA: Diagnosis not present

## 2020-10-08 DIAGNOSIS — R059 Cough, unspecified: Secondary | ICD-10-CM | POA: Diagnosis not present

## 2020-10-08 DIAGNOSIS — Z6824 Body mass index (BMI) 24.0-24.9, adult: Secondary | ICD-10-CM | POA: Diagnosis not present

## 2020-10-08 DIAGNOSIS — J449 Chronic obstructive pulmonary disease, unspecified: Secondary | ICD-10-CM | POA: Diagnosis not present

## 2020-10-09 DIAGNOSIS — H401132 Primary open-angle glaucoma, bilateral, moderate stage: Secondary | ICD-10-CM | POA: Diagnosis not present

## 2020-10-09 DIAGNOSIS — H04123 Dry eye syndrome of bilateral lacrimal glands: Secondary | ICD-10-CM | POA: Diagnosis not present

## 2020-10-13 ENCOUNTER — Telehealth: Payer: Self-pay | Admitting: Gastroenterology

## 2020-10-13 NOTE — Telephone Encounter (Signed)
Patient called about thrush in her mouth.  She has been taking treatments for her lung cancer and every time she gets an infusion she gets the thrush.  She was given nystatin and fluconazole.  Only took a couple of days worth of the fluconazole.  Uses the nystatin intermittently.  Now reporting thrush again.  Says that she thinks that she might have some in her esophagus because it is hurting some to swallow or is difficult to swallow.  She took the fluconazole this morning.  Advised that she continue that as well as the nystatin.  She certainly can even swish and swallow the nystatin as well.  This was all prescribed by her oncologist.  She has appointment with Dr. Lyndel Safe next week, but I advised that she contact her oncologist tomorrow for any other recommendations/refills, etc.

## 2020-10-16 DIAGNOSIS — B370 Candidal stomatitis: Secondary | ICD-10-CM | POA: Diagnosis not present

## 2020-10-16 DIAGNOSIS — Z6824 Body mass index (BMI) 24.0-24.9, adult: Secondary | ICD-10-CM | POA: Diagnosis not present

## 2020-10-16 DIAGNOSIS — R202 Paresthesia of skin: Secondary | ICD-10-CM | POA: Diagnosis not present

## 2020-10-16 DIAGNOSIS — E1169 Type 2 diabetes mellitus with other specified complication: Secondary | ICD-10-CM | POA: Diagnosis not present

## 2020-10-16 DIAGNOSIS — K121 Other forms of stomatitis: Secondary | ICD-10-CM | POA: Diagnosis not present

## 2020-10-16 DIAGNOSIS — E785 Hyperlipidemia, unspecified: Secondary | ICD-10-CM | POA: Diagnosis not present

## 2020-10-16 DIAGNOSIS — H539 Unspecified visual disturbance: Secondary | ICD-10-CM | POA: Diagnosis not present

## 2020-10-17 DIAGNOSIS — B370 Candidal stomatitis: Secondary | ICD-10-CM | POA: Diagnosis not present

## 2020-10-17 DIAGNOSIS — R040 Epistaxis: Secondary | ICD-10-CM | POA: Diagnosis not present

## 2020-10-18 DIAGNOSIS — E876 Hypokalemia: Secondary | ICD-10-CM | POA: Diagnosis not present

## 2020-10-18 DIAGNOSIS — Z79899 Other long term (current) drug therapy: Secondary | ICD-10-CM | POA: Diagnosis not present

## 2020-10-18 DIAGNOSIS — R918 Other nonspecific abnormal finding of lung field: Secondary | ICD-10-CM | POA: Diagnosis not present

## 2020-10-18 DIAGNOSIS — E0590 Thyrotoxicosis, unspecified without thyrotoxic crisis or storm: Secondary | ICD-10-CM | POA: Diagnosis not present

## 2020-10-18 DIAGNOSIS — C4A59 Merkel cell carcinoma of other part of trunk: Secondary | ICD-10-CM | POA: Diagnosis not present

## 2020-10-18 DIAGNOSIS — Z66 Do not resuscitate: Secondary | ICD-10-CM | POA: Diagnosis not present

## 2020-10-18 DIAGNOSIS — C4A9 Merkel cell carcinoma, unspecified: Secondary | ICD-10-CM | POA: Diagnosis not present

## 2020-10-20 DIAGNOSIS — K5909 Other constipation: Secondary | ICD-10-CM | POA: Diagnosis not present

## 2020-10-20 DIAGNOSIS — J449 Chronic obstructive pulmonary disease, unspecified: Secondary | ICD-10-CM | POA: Diagnosis not present

## 2020-10-20 DIAGNOSIS — I2510 Atherosclerotic heart disease of native coronary artery without angina pectoris: Secondary | ICD-10-CM | POA: Diagnosis not present

## 2020-10-20 DIAGNOSIS — Z87891 Personal history of nicotine dependence: Secondary | ICD-10-CM | POA: Diagnosis not present

## 2020-10-20 DIAGNOSIS — E876 Hypokalemia: Secondary | ICD-10-CM | POA: Diagnosis not present

## 2020-10-20 DIAGNOSIS — K219 Gastro-esophageal reflux disease without esophagitis: Secondary | ICD-10-CM | POA: Diagnosis not present

## 2020-10-20 DIAGNOSIS — Z7982 Long term (current) use of aspirin: Secondary | ICD-10-CM | POA: Diagnosis not present

## 2020-10-20 DIAGNOSIS — E785 Hyperlipidemia, unspecified: Secondary | ICD-10-CM | POA: Diagnosis not present

## 2020-10-20 DIAGNOSIS — Z9049 Acquired absence of other specified parts of digestive tract: Secondary | ICD-10-CM | POA: Diagnosis not present

## 2020-10-20 DIAGNOSIS — R202 Paresthesia of skin: Secondary | ICD-10-CM | POA: Diagnosis not present

## 2020-10-20 DIAGNOSIS — E039 Hypothyroidism, unspecified: Secondary | ICD-10-CM | POA: Diagnosis not present

## 2020-10-20 DIAGNOSIS — R200 Anesthesia of skin: Secondary | ICD-10-CM | POA: Diagnosis not present

## 2020-10-20 DIAGNOSIS — Z79899 Other long term (current) drug therapy: Secondary | ICD-10-CM | POA: Diagnosis not present

## 2020-10-20 DIAGNOSIS — Z85118 Personal history of other malignant neoplasm of bronchus and lung: Secondary | ICD-10-CM | POA: Diagnosis not present

## 2020-10-21 ENCOUNTER — Ambulatory Visit: Payer: Medicare Other | Admitting: Gastroenterology

## 2020-10-22 DIAGNOSIS — F419 Anxiety disorder, unspecified: Secondary | ICD-10-CM | POA: Diagnosis not present

## 2020-10-22 DIAGNOSIS — Z5321 Procedure and treatment not carried out due to patient leaving prior to being seen by health care provider: Secondary | ICD-10-CM | POA: Diagnosis not present

## 2020-10-22 DIAGNOSIS — R200 Anesthesia of skin: Secondary | ICD-10-CM | POA: Diagnosis not present

## 2020-10-24 DIAGNOSIS — E876 Hypokalemia: Secondary | ICD-10-CM | POA: Diagnosis not present

## 2020-10-24 DIAGNOSIS — Z5321 Procedure and treatment not carried out due to patient leaving prior to being seen by health care provider: Secondary | ICD-10-CM | POA: Diagnosis not present

## 2020-10-27 ENCOUNTER — Other Ambulatory Visit: Payer: Self-pay

## 2020-10-27 ENCOUNTER — Encounter (HOSPITAL_COMMUNITY): Payer: Self-pay

## 2020-10-27 ENCOUNTER — Emergency Department (HOSPITAL_COMMUNITY)
Admission: EM | Admit: 2020-10-27 | Discharge: 2020-10-28 | Disposition: A | Discharge status: Home / Self Care | Payer: Medicare Other | Attending: Emergency Medicine | Admitting: Emergency Medicine

## 2020-10-27 ENCOUNTER — Emergency Department (HOSPITAL_COMMUNITY): Payer: Medicare Other

## 2020-10-27 DIAGNOSIS — J449 Chronic obstructive pulmonary disease, unspecified: Secondary | ICD-10-CM | POA: Diagnosis not present

## 2020-10-27 DIAGNOSIS — R457 State of emotional shock and stress, unspecified: Secondary | ICD-10-CM | POA: Diagnosis not present

## 2020-10-27 DIAGNOSIS — R0602 Shortness of breath: Secondary | ICD-10-CM | POA: Insufficient documentation

## 2020-10-27 DIAGNOSIS — Z5321 Procedure and treatment not carried out due to patient leaving prior to being seen by health care provider: Secondary | ICD-10-CM | POA: Diagnosis not present

## 2020-10-27 DIAGNOSIS — Z85118 Personal history of other malignant neoplasm of bronchus and lung: Secondary | ICD-10-CM | POA: Diagnosis not present

## 2020-10-27 DIAGNOSIS — R000 Tachycardia, unspecified: Secondary | ICD-10-CM | POA: Diagnosis not present

## 2020-10-27 DIAGNOSIS — I959 Hypotension, unspecified: Secondary | ICD-10-CM | POA: Diagnosis not present

## 2020-10-27 DIAGNOSIS — R202 Paresthesia of skin: Secondary | ICD-10-CM | POA: Diagnosis not present

## 2020-10-27 LAB — CBC
HCT: 43.7 % (ref 36.0–46.0)
Hemoglobin: 14.1 g/dL (ref 12.0–15.0)
MCH: 29.3 pg (ref 26.0–34.0)
MCHC: 32.3 g/dL (ref 30.0–36.0)
MCV: 90.7 fL (ref 80.0–100.0)
Platelets: 335 10*3/uL (ref 150–400)
RBC: 4.82 MIL/uL (ref 3.87–5.11)
RDW: 12.9 % (ref 11.5–15.5)
WBC: 7 10*3/uL (ref 4.0–10.5)
nRBC: 0 % (ref 0.0–0.2)

## 2020-10-27 LAB — BASIC METABOLIC PANEL
Anion gap: 11 (ref 5–15)
BUN: 20 mg/dL (ref 8–23)
CO2: 20 mmol/L — ABNORMAL LOW (ref 22–32)
Calcium: 9.7 mg/dL (ref 8.9–10.3)
Chloride: 110 mmol/L (ref 98–111)
Creatinine, Ser: 0.92 mg/dL (ref 0.44–1.00)
GFR, Estimated: >60 mL/min (ref >60)
Glucose, Bld: 264 mg/dL — ABNORMAL HIGH (ref 70–99)
Potassium: 3.8 mmol/L (ref 3.5–5.1)
Sodium: 141 mmol/L (ref 135–145)

## 2020-10-27 MED ORDER — ALBUTEROL SULFATE HFA 108 (90 BASE) MCG/ACT IN AERS
2.0000 | INHALATION_SPRAY | RESPIRATORY_TRACT | Status: DC | PRN
  Filled 2020-10-27: qty 6.7

## 2020-10-27 NOTE — ED Triage Notes (Signed)
Upper R lobectomy, and "sliver" of L lower lobe removed, also with hx of COPD

## 2020-10-27 NOTE — ED Triage Notes (Signed)
Patient arrives with [facility], SOB for 6 hours, took albuterol inhaler prior to EMS, hx of lung CA, also reports high blood sugar from K+?, hx of anxiety but is not on meds, seen several times for the same.    116/60 98% RA CBG 224 97.8 oral 120 HR

## 2020-10-28 NOTE — ED Notes (Signed)
Pt stated that she would like her husband called so she can go home. Pt left with her husband.

## 2020-10-29 DIAGNOSIS — E876 Hypokalemia: Secondary | ICD-10-CM | POA: Diagnosis not present

## 2020-10-29 DIAGNOSIS — Z6823 Body mass index (BMI) 23.0-23.9, adult: Secondary | ICD-10-CM | POA: Diagnosis not present

## 2020-10-29 DIAGNOSIS — E0590 Thyrotoxicosis, unspecified without thyrotoxic crisis or storm: Secondary | ICD-10-CM | POA: Diagnosis not present

## 2020-10-29 DIAGNOSIS — E1165 Type 2 diabetes mellitus with hyperglycemia: Secondary | ICD-10-CM | POA: Diagnosis not present

## 2020-11-13 DIAGNOSIS — E0590 Thyrotoxicosis, unspecified without thyrotoxic crisis or storm: Secondary | ICD-10-CM | POA: Diagnosis not present

## 2020-11-13 DIAGNOSIS — E1165 Type 2 diabetes mellitus with hyperglycemia: Secondary | ICD-10-CM | POA: Diagnosis not present

## 2020-11-13 DIAGNOSIS — Z6823 Body mass index (BMI) 23.0-23.9, adult: Secondary | ICD-10-CM | POA: Diagnosis not present

## 2020-11-13 DIAGNOSIS — R000 Tachycardia, unspecified: Secondary | ICD-10-CM | POA: Diagnosis not present

## 2020-11-13 DIAGNOSIS — R21 Rash and other nonspecific skin eruption: Secondary | ICD-10-CM | POA: Diagnosis not present

## 2020-11-14 DIAGNOSIS — J189 Pneumonia, unspecified organism: Secondary | ICD-10-CM | POA: Diagnosis not present

## 2020-11-14 DIAGNOSIS — C7B1 Secondary Merkel cell carcinoma: Secondary | ICD-10-CM | POA: Diagnosis not present

## 2020-11-14 DIAGNOSIS — C4A9 Merkel cell carcinoma, unspecified: Secondary | ICD-10-CM | POA: Diagnosis not present

## 2020-11-14 DIAGNOSIS — C439 Malignant melanoma of skin, unspecified: Secondary | ICD-10-CM | POA: Diagnosis not present

## 2020-11-14 DIAGNOSIS — Z9289 Personal history of other medical treatment: Secondary | ICD-10-CM | POA: Diagnosis not present

## 2020-11-14 DIAGNOSIS — Z79899 Other long term (current) drug therapy: Secondary | ICD-10-CM | POA: Diagnosis not present

## 2020-11-16 DIAGNOSIS — E0590 Thyrotoxicosis, unspecified without thyrotoxic crisis or storm: Secondary | ICD-10-CM | POA: Diagnosis not present

## 2020-11-16 DIAGNOSIS — J449 Chronic obstructive pulmonary disease, unspecified: Secondary | ICD-10-CM | POA: Diagnosis not present

## 2020-11-16 DIAGNOSIS — K219 Gastro-esophageal reflux disease without esophagitis: Secondary | ICD-10-CM | POA: Diagnosis not present

## 2020-11-16 DIAGNOSIS — R000 Tachycardia, unspecified: Secondary | ICD-10-CM | POA: Diagnosis not present

## 2020-11-16 DIAGNOSIS — I499 Cardiac arrhythmia, unspecified: Secondary | ICD-10-CM | POA: Diagnosis not present

## 2020-11-16 DIAGNOSIS — Z7982 Long term (current) use of aspirin: Secondary | ICD-10-CM | POA: Diagnosis not present

## 2020-11-16 DIAGNOSIS — R0602 Shortness of breath: Secondary | ICD-10-CM | POA: Diagnosis not present

## 2020-11-16 DIAGNOSIS — I2510 Atherosclerotic heart disease of native coronary artery without angina pectoris: Secondary | ICD-10-CM | POA: Diagnosis not present

## 2020-11-16 DIAGNOSIS — R0600 Dyspnea, unspecified: Secondary | ICD-10-CM | POA: Diagnosis not present

## 2020-11-16 DIAGNOSIS — R002 Palpitations: Secondary | ICD-10-CM | POA: Diagnosis not present

## 2020-11-16 DIAGNOSIS — Z87891 Personal history of nicotine dependence: Secondary | ICD-10-CM | POA: Diagnosis not present

## 2020-11-16 DIAGNOSIS — N390 Urinary tract infection, site not specified: Secondary | ICD-10-CM | POA: Diagnosis not present

## 2020-11-17 DIAGNOSIS — Z9851 Tubal ligation status: Secondary | ICD-10-CM | POA: Diagnosis not present

## 2020-11-17 DIAGNOSIS — Z79899 Other long term (current) drug therapy: Secondary | ICD-10-CM | POA: Diagnosis not present

## 2020-11-17 DIAGNOSIS — E875 Hyperkalemia: Secondary | ICD-10-CM | POA: Diagnosis not present

## 2020-11-17 DIAGNOSIS — K219 Gastro-esophageal reflux disease without esophagitis: Secondary | ICD-10-CM | POA: Diagnosis not present

## 2020-11-17 DIAGNOSIS — J449 Chronic obstructive pulmonary disease, unspecified: Secondary | ICD-10-CM | POA: Diagnosis not present

## 2020-11-17 DIAGNOSIS — R202 Paresthesia of skin: Secondary | ICD-10-CM | POA: Diagnosis not present

## 2020-11-17 DIAGNOSIS — Z85118 Personal history of other malignant neoplasm of bronchus and lung: Secondary | ICD-10-CM | POA: Diagnosis not present

## 2020-11-17 DIAGNOSIS — I2510 Atherosclerotic heart disease of native coronary artery without angina pectoris: Secondary | ICD-10-CM | POA: Diagnosis not present

## 2020-11-17 DIAGNOSIS — I499 Cardiac arrhythmia, unspecified: Secondary | ICD-10-CM | POA: Diagnosis not present

## 2020-11-17 DIAGNOSIS — Z87891 Personal history of nicotine dependence: Secondary | ICD-10-CM | POA: Diagnosis not present

## 2020-11-17 DIAGNOSIS — R000 Tachycardia, unspecified: Secondary | ICD-10-CM | POA: Diagnosis not present

## 2020-11-17 DIAGNOSIS — Z9049 Acquired absence of other specified parts of digestive tract: Secondary | ICD-10-CM | POA: Diagnosis not present

## 2020-11-19 DIAGNOSIS — C4A9 Merkel cell carcinoma, unspecified: Secondary | ICD-10-CM | POA: Diagnosis not present

## 2020-11-19 DIAGNOSIS — R739 Hyperglycemia, unspecified: Secondary | ICD-10-CM | POA: Diagnosis not present

## 2020-11-19 DIAGNOSIS — F419 Anxiety disorder, unspecified: Secondary | ICD-10-CM | POA: Diagnosis not present

## 2020-11-19 DIAGNOSIS — J189 Pneumonia, unspecified organism: Secondary | ICD-10-CM | POA: Diagnosis not present

## 2020-11-22 DIAGNOSIS — Z20822 Contact with and (suspected) exposure to covid-19: Secondary | ICD-10-CM | POA: Diagnosis not present

## 2020-11-26 DIAGNOSIS — R29898 Other symptoms and signs involving the musculoskeletal system: Secondary | ICD-10-CM | POA: Diagnosis not present

## 2020-11-26 DIAGNOSIS — E74819 Disorders of glucose transport, unspecified: Secondary | ICD-10-CM | POA: Diagnosis not present

## 2020-11-26 DIAGNOSIS — E139 Other specified diabetes mellitus without complications: Secondary | ICD-10-CM | POA: Diagnosis not present

## 2020-11-26 DIAGNOSIS — C4A9 Merkel cell carcinoma, unspecified: Secondary | ICD-10-CM | POA: Diagnosis not present

## 2020-11-26 DIAGNOSIS — Z9289 Personal history of other medical treatment: Secondary | ICD-10-CM | POA: Diagnosis not present

## 2020-11-26 DIAGNOSIS — E039 Hypothyroidism, unspecified: Secondary | ICD-10-CM | POA: Diagnosis not present

## 2020-11-28 DIAGNOSIS — E876 Hypokalemia: Secondary | ICD-10-CM | POA: Diagnosis not present

## 2020-11-28 DIAGNOSIS — E0590 Thyrotoxicosis, unspecified without thyrotoxic crisis or storm: Secondary | ICD-10-CM | POA: Diagnosis not present

## 2020-11-28 DIAGNOSIS — Z6822 Body mass index (BMI) 22.0-22.9, adult: Secondary | ICD-10-CM | POA: Diagnosis not present

## 2020-11-28 DIAGNOSIS — E1165 Type 2 diabetes mellitus with hyperglycemia: Secondary | ICD-10-CM | POA: Diagnosis not present

## 2020-12-03 DIAGNOSIS — F419 Anxiety disorder, unspecified: Secondary | ICD-10-CM | POA: Diagnosis not present

## 2020-12-03 DIAGNOSIS — R29898 Other symptoms and signs involving the musculoskeletal system: Secondary | ICD-10-CM | POA: Diagnosis not present

## 2020-12-03 DIAGNOSIS — R1909 Other intra-abdominal and pelvic swelling, mass and lump: Secondary | ICD-10-CM | POA: Diagnosis not present

## 2020-12-03 DIAGNOSIS — Z9289 Personal history of other medical treatment: Secondary | ICD-10-CM | POA: Diagnosis not present

## 2020-12-03 DIAGNOSIS — C4A9 Merkel cell carcinoma, unspecified: Secondary | ICD-10-CM | POA: Diagnosis not present

## 2020-12-03 DIAGNOSIS — E1369 Other specified diabetes mellitus with other specified complication: Secondary | ICD-10-CM | POA: Diagnosis not present

## 2020-12-04 DIAGNOSIS — R531 Weakness: Secondary | ICD-10-CM | POA: Diagnosis not present

## 2020-12-04 DIAGNOSIS — T503X5A Adverse effect of electrolytic, caloric and water-balance agents, initial encounter: Secondary | ICD-10-CM | POA: Diagnosis not present

## 2020-12-04 DIAGNOSIS — T50905A Adverse effect of unspecified drugs, medicaments and biological substances, initial encounter: Secondary | ICD-10-CM | POA: Diagnosis not present

## 2020-12-04 DIAGNOSIS — R202 Paresthesia of skin: Secondary | ICD-10-CM | POA: Diagnosis not present

## 2020-12-05 DIAGNOSIS — R202 Paresthesia of skin: Secondary | ICD-10-CM | POA: Diagnosis not present

## 2020-12-05 DIAGNOSIS — Z6822 Body mass index (BMI) 22.0-22.9, adult: Secondary | ICD-10-CM | POA: Diagnosis not present

## 2020-12-05 DIAGNOSIS — E1165 Type 2 diabetes mellitus with hyperglycemia: Secondary | ICD-10-CM | POA: Diagnosis not present

## 2020-12-10 DIAGNOSIS — Z9289 Personal history of other medical treatment: Secondary | ICD-10-CM | POA: Diagnosis not present

## 2020-12-10 DIAGNOSIS — C4A59 Merkel cell carcinoma of other part of trunk: Secondary | ICD-10-CM | POA: Diagnosis not present

## 2020-12-10 DIAGNOSIS — C4A9 Merkel cell carcinoma, unspecified: Secondary | ICD-10-CM | POA: Diagnosis not present

## 2020-12-10 DIAGNOSIS — Z79899 Other long term (current) drug therapy: Secondary | ICD-10-CM | POA: Diagnosis not present

## 2020-12-23 DIAGNOSIS — R918 Other nonspecific abnormal finding of lung field: Secondary | ICD-10-CM | POA: Diagnosis not present

## 2020-12-23 DIAGNOSIS — C4A59 Merkel cell carcinoma of other part of trunk: Secondary | ICD-10-CM | POA: Diagnosis not present

## 2020-12-24 DIAGNOSIS — Z5112 Encounter for antineoplastic immunotherapy: Secondary | ICD-10-CM | POA: Diagnosis not present

## 2020-12-24 DIAGNOSIS — R591 Generalized enlarged lymph nodes: Secondary | ICD-10-CM | POA: Diagnosis not present

## 2020-12-24 DIAGNOSIS — J449 Chronic obstructive pulmonary disease, unspecified: Secondary | ICD-10-CM | POA: Diagnosis not present

## 2020-12-24 DIAGNOSIS — R946 Abnormal results of thyroid function studies: Secondary | ICD-10-CM | POA: Diagnosis not present

## 2020-12-24 DIAGNOSIS — Z79899 Other long term (current) drug therapy: Secondary | ICD-10-CM | POA: Diagnosis not present

## 2020-12-24 DIAGNOSIS — R739 Hyperglycemia, unspecified: Secondary | ICD-10-CM | POA: Diagnosis not present

## 2020-12-24 DIAGNOSIS — Z66 Do not resuscitate: Secondary | ICD-10-CM | POA: Diagnosis not present

## 2020-12-24 DIAGNOSIS — C4A9 Merkel cell carcinoma, unspecified: Secondary | ICD-10-CM | POA: Diagnosis not present

## 2020-12-24 DIAGNOSIS — R531 Weakness: Secondary | ICD-10-CM | POA: Diagnosis not present

## 2021-01-07 DIAGNOSIS — Z79899 Other long term (current) drug therapy: Secondary | ICD-10-CM | POA: Diagnosis not present

## 2021-01-07 DIAGNOSIS — C4A9 Merkel cell carcinoma, unspecified: Secondary | ICD-10-CM | POA: Diagnosis not present

## 2021-01-07 DIAGNOSIS — Z9289 Personal history of other medical treatment: Secondary | ICD-10-CM | POA: Diagnosis not present

## 2021-01-09 DIAGNOSIS — C4A9 Merkel cell carcinoma, unspecified: Secondary | ICD-10-CM | POA: Diagnosis not present

## 2021-01-09 NOTE — Progress Notes (Deleted)
No chief complaint on file.    HISTORY OF PRESENT ILLNESS: 01/09/21 ALL:  Bailey Nguyen is a 75 y.o. female here today for follow up for facial paresthesias. She was seen in consult with Dr Rexene Alberts 08/2019 and started on low dose gabapentin. Cervical spine imaging showed significant multilevel degenerative changes and she was advised to see spine specialist. MRI brain was unremarkable.    HISTORY (copied from Dr Guadelupe Sabin previous note 08/2019)  Dear Dr. Lisbeth Ply,   I saw your patient, Bailey Nguyen, upon your kind request in my neurologic clinic today for initial consultation of her facial paresthesias.  The patient is unaccompanied today.  As you know, Bailey Nguyen is a 75 year old right-handed woman with an underlying medical history of lung cancer, glaucoma, reflux disease, COPD, smoking, chronic constipation, anxiety, osteoporosis, hyperlipidemia, TIA, Lyme disease, and borderline overweight state, who developed left lower face paresthesias and visual blurring in December 2020.  She reports that she has had lower facial tingling, particularly on the left side with some numbness around the face and lips for probably  longer than December 2020.  Symptoms are intermittent, may last a few minutes to a few hours.  The longest lasting symptoms made her go to the emergency room at [facility].  She denies any one-sided weakness or numbness in the hemibody.  She has a longstanding history of neck pain and sometimes the pain radiates up her neck and into the back of her head.  Recently, she had a severe right-sided headache.  She reports a history of migraines and visual auras preceding her migraines when she was much younger.  This was when she was in her 2s and 30s most likely and she has not had any significant migraines until recently.  She does endorse as a trigger possible stress and sleep deprivation.  She does not sleep very well.  She does not snore or wake up gasping or has pauses in her  breathing.  She does not keep a very set schedule.  She may go to bed around 2 or 3 AM and sleep till 9 AM.  Sometimes she tosses and turns all night.  She tries to hydrate well with water, drinks caffeine in the form of coffee about 3 to 4 cups/day.  She does not drink any alcohol and quit smoking over a year ago. She reports intermittent blurry vision which actually improved after her eye doctor started her on Restasis.  She reports visual distortions, including wavy lines, similar to her migraine auras but she does not always have a headache with it.  Her symptoms of facial paresthesias and numbness may happen every other day, she is not sure about the length or frequency of her symptoms in general.  It seems like her neck pain gets worse when she is driving or working at the computer.  She did have a neck MRI in 2019 which did show severe degenerative changes.  She reports that she has not seen a spine specialist.  She did have lower back surgery some years ago and has stable numbness in her left leg in the distal lateral portion including the lateral aspect of her left foot.   I reviewed the office note from 05/08/2019.  She presented to the emergency room on 05/02/2019 after an ophthalmology appointment which did not include a dilated eye exam.  She had a routine check on her glaucoma at the time.  She had a CT head without contrast on 05/02/2019 and I reviewed  the results: Impression: White matter hypodensity primarily in the parietal lobes probably from chronic ischemic microvascular white matter disease, although if the patient has seizures and her hypertension and the possibility of posterior reversible encephalopathy syndrome might be considered.  Atherosclerosis.  Otherwise unremarkable. I reviewed the emergency room records from 05/02/2019.  She declined a CT angiogram and a neurology consultation.  She was advised to start taking a baby aspirin and increase her Crestor to 40 mg daily.  She did have  a subsequent she did have a subsequent CT angiogram of the head and neck with and without contrast as an outpatient only 05/03/2019 and I reviewed the results:    No intracranial arterial occlusion or high-grade stenosis. 2. No cervical carotid stenosis. 3. Aortic Atherosclerosis (ICD10-I70.0) and Emphysema (ICD10-J43.9).   REVIEW OF SYSTEMS: Out of a complete 14 system review of symptoms, the patient complains only of the following symptoms, and all other reviewed systems are negative.   ALLERGIES: No Known Allergies   HOME MEDICATIONS: Outpatient Medications Prior to Visit  Medication Sig Dispense Refill   albuterol (VENTOLIN HFA) 108 (90 Base) MCG/ACT inhaler Inhale 2 puffs into the lungs every 6 (six) hours as needed for wheezing or shortness of breath.     apixaban (ELIQUIS) 5 MG TABS tablet Take 2.5 mg by mouth 2 (two) times daily.     brinzolamide (AZOPT) 1 % ophthalmic suspension Place 1 drop into the right eye 2 (two) times daily.     Budeson-Glycopyrrol-Formoterol (BREZTRI AEROSPHERE) 160-9-4.8 MCG/ACT AERO Inhale 2 puffs into the lungs in the morning and at bedtime.     Cholecalciferol 25 MCG (1000 UT) tablet Take 1,000 Units by mouth daily.     cyclobenzaprine (FLEXERIL) 5 MG tablet Take 5 mg by mouth 3 (three) times daily as needed. (back muscle pain)  1   cycloSPORINE (RESTASIS) 0.05 % ophthalmic emulsion Place 1 drop into both eyes in the morning and at bedtime. 2x day every 12 hours.     EPINEPHrine 0.3 mg/0.3 mL IJ SOAJ injection Inject 0.3 mg into the muscle as needed for anaphylaxis.     ipratropium-albuterol (DUONEB) 0.5-2.5 (3) MG/3ML SOLN Take 3 mLs by nebulization every 6 (six) hours as needed (asthma).     OXYGEN Inhale 2 L into the lungs at bedtime.     pantoprazole (PROTONIX) 40 MG tablet Take 1 tablet (40 mg total) by mouth 2 (two) times daily. 60 tablet 11   polyethylene glycol (MIRALAX / GLYCOLAX) packet Take 17 g by mouth daily as needed for constipation.      promethazine (PHENERGAN) 25 MG tablet TAKE 1 TABLET BY MOUTH EVERY 6 HOURS AS NEEDED FOR NAUSEA OR VOMITING. 30 tablet 0   rosuvastatin (CRESTOR) 40 MG tablet Take 40 mg by mouth daily.     sodium chloride 0.9 % nebulizer solution Take 3 mLs by nebulization as needed for wheezing.     No facility-administered medications prior to visit.     PAST MEDICAL HISTORY: Past Medical History:  Diagnosis Date   Anxiety    Back ache    Cataract    il cataracts removed   Chest wall pain    Chronic constipation    COPD (chronic obstructive pulmonary disease) (HCC)    Coronary artery calcification seen on CT scan 01/01/2016   Diverticula of intestine    sigmond   Dyskinesia of esophagus    Emphysema of lung (HCC)    GERD (gastroesophageal reflux disease)    Glaucoma  History of COVID-19 05/2020   Hx of colonic polyps    Hx of Lyme disease    Hx of TIA (transient ischemic attack) and stroke    Hypercholesteremia    Lung cancer ([place])    Removed upper right portion of lung    Neck pain    Osteoporosis    Prediabetes    Splenic infarct    Stroke ([place])    TIA   Tobacco use disorder      PAST SURGICAL HISTORY: Past Surgical History:  Procedure Laterality Date   BACK SURGERY     BIOPSY  07/09/2020   Procedure: BIOPSY;  Surgeon: Jackquline Denmark, MD;  Location: [facility];  Service: Endoscopy;;   CHOLECYSTECTOMY     COLONOSCOPY  09/22/2013   Moderate predominantly sigmoid diverticulosis. Moderate internal hemorrhoids. Otherwise normal colonoscopy. Limited due to qualityt of prep/.   ESOPHAGOGASTRODUODENOSCOPY  03/05/2017   Peptic esophageal stricture. Gastritis.    ESOPHAGOGASTRODUODENOSCOPY (EGD) WITH PROPOFOL N/A 07/09/2020   Procedure: ESOPHAGOGASTRODUODENOSCOPY (EGD) WITH PROPOFOL;  Surgeon: Jackquline Denmark, MD;  Location: [facility];  Service: Endoscopy;  Laterality: N/A;   LUNG REMOVAL, PARTIAL Right 01/2015   LUNG SURGERY Left 07/24/2018   MALONEY DILATION  07/09/2020    Procedure: Venia Minks DILATION;  Surgeon: Jackquline Denmark, MD;  Location: [facility];  Service: Endoscopy;;   TUBAL LIGATION       FAMILY HISTORY: Family History  Problem Relation Age of Onset   Diabetes Sister    Diabetes Brother    Lung cancer Brother    Diabetes Father    Pancreatic cancer Father    Coronary artery disease Mother 60   Thyroid cancer Daughter    Colon cancer Neg Hx    Esophageal cancer Neg Hx    Stomach cancer Neg Hx    Rectal cancer Neg Hx      SOCIAL HISTORY: Social History   Socioeconomic History   Marital status: Married    Spouse name: Not on file   Number of children: 5   Years of education: Not on file   Highest education level: Not on file  Occupational History   Not on file  Tobacco Use   Smoking status: Former    Packs/day: 1.00    Types: Cigarettes    Quit date: 02/01/2015    Years since quitting: 5.9   Smokeless tobacco: Never  Vaping Use   Vaping Use: Former   Devices: Tried it before for a week  Substance and Sexual Activity   Alcohol use: No    Alcohol/week: 0.0 standard drinks   Drug use: No   Sexual activity: Not on file  Other Topics Concern   Not on file  Social History Narrative   Not on file   Social Determinants of Health   Financial Resource Strain: Not on file  Food Insecurity: Not on file  Transportation Needs: Not on file  Physical Activity: Not on file  Stress: Not on file  Social Connections: Not on file  Intimate Partner Violence: Not on file     PHYSICAL EXAM  There were no vitals filed for this visit. There is no height or weight on file to calculate BMI.   Generalized: Well developed, in no acute distress  Cardiology: normal rate and rhythm, no murmur auscultated  Respiratory: clear to auscultation bilaterally    Neurological examination  Mentation: Alert oriented to time, place, history taking. Follows all commands speech and language fluent Cranial nerve II-XII: Pupils were equal round  reactive to light. Extraocular movements were full, visual field were full on confrontational test. Facial sensation and strength were normal. Uvula tongue midline. Head turning and shoulder shrug  were normal and symmetric. Motor: The motor testing reveals 5 over 5 strength of all 4 extremities. Good symmetric motor tone is noted throughout.  Sensory: Sensory testing is intact to soft touch on all 4 extremities. No evidence of extinction is noted.  Coordination: Cerebellar testing reveals good finger-nose-finger and heel-to-shin bilaterally.  Gait and station: Gait is normal. Tandem gait is normal. Romberg is negative. No drift is seen.  Reflexes: Deep tendon reflexes are symmetric and normal bilaterally.    DIAGNOSTIC DATA (LABS, IMAGING, TESTING) - I reviewed patient records, labs, notes, testing and imaging myself where available.  Lab Results  Component Value Date   WBC 7.0 10/27/2020   HGB 14.1 10/27/2020   HCT 43.7 10/27/2020   MCV 90.7 10/27/2020   PLT 335 10/27/2020      Component Value Date/Time   NA 141 10/27/2020 2218   NA 142 08/29/2019 0947   K 3.8 10/27/2020 2218   CL 110 10/27/2020 2218   CO2 20 (L) 10/27/2020 2218   GLUCOSE 264 (H) 10/27/2020 2218   BUN 20 10/27/2020 2218   BUN 14 08/29/2019 0947   CREATININE 0.92 10/27/2020 2218   CALCIUM 9.7 10/27/2020 2218   PROT 7.4 08/29/2019 0947   ALBUMIN 4.6 08/29/2019 0947   AST 13 08/29/2019 0947   ALT 13 08/29/2019 0947   ALKPHOS 88 08/29/2019 0947   BILITOT 0.2 08/29/2019 0947   GFRNONAA >60 10/27/2020 2218   GFRAA 68 08/29/2019 0947   No results found for: CHOL, HDL, LDLCALC, LDLDIRECT, TRIG, CHOLHDL No results found for: HGBA1C No results found for: VITAMINB12 No results found for: TSH  No flowsheet data found.   No flowsheet data found.   ASSESSMENT AND PLAN  75 y.o. year old female  has a past medical history of Anxiety, Back ache, Cataract, Chest wall pain, Chronic constipation, COPD (chronic  obstructive pulmonary disease) ([place]), Coronary artery calcification seen on CT scan (01/01/2016), Diverticula of intestine, Dyskinesia of esophagus, Emphysema of lung ([place]), GERD (gastroesophageal reflux disease), Glaucoma, History of COVID-19 (05/2020), colonic polyps, Lyme disease, TIA (transient ischemic attack) and stroke, Hypercholesteremia, Lung cancer ([place]), Neck pain, Osteoporosis, Prediabetes, Splenic infarct, Stroke ([place]), and Tobacco use disorder. here with    No diagnosis found.   No orders of the defined types were placed in this encounter.    No orders of the defined types were placed in this encounter.     Debbora Presto, MSN, FNP-C 01/09/2021, 11:07 AM  [facility] [address], [place], [place] [address] [phone]

## 2021-01-14 ENCOUNTER — Ambulatory Visit: Payer: Medicare Other | Admitting: Family Medicine

## 2021-01-14 DIAGNOSIS — R202 Paresthesia of skin: Secondary | ICD-10-CM

## 2021-01-20 DIAGNOSIS — M549 Dorsalgia, unspecified: Secondary | ICD-10-CM | POA: Diagnosis not present

## 2021-01-20 DIAGNOSIS — G8929 Other chronic pain: Secondary | ICD-10-CM | POA: Diagnosis not present

## 2021-01-20 DIAGNOSIS — R202 Paresthesia of skin: Secondary | ICD-10-CM | POA: Diagnosis not present

## 2021-01-20 DIAGNOSIS — Z6823 Body mass index (BMI) 23.0-23.9, adult: Secondary | ICD-10-CM | POA: Diagnosis not present

## 2021-01-21 DIAGNOSIS — J189 Pneumonia, unspecified organism: Secondary | ICD-10-CM | POA: Diagnosis not present

## 2021-01-21 DIAGNOSIS — C4A9 Merkel cell carcinoma, unspecified: Secondary | ICD-10-CM | POA: Diagnosis not present

## 2021-01-21 DIAGNOSIS — Z9289 Personal history of other medical treatment: Secondary | ICD-10-CM | POA: Diagnosis not present

## 2021-01-21 DIAGNOSIS — C7B1 Secondary Merkel cell carcinoma: Secondary | ICD-10-CM | POA: Diagnosis not present

## 2021-01-21 DIAGNOSIS — R591 Generalized enlarged lymph nodes: Secondary | ICD-10-CM | POA: Diagnosis not present

## 2021-01-21 DIAGNOSIS — R0600 Dyspnea, unspecified: Secondary | ICD-10-CM | POA: Diagnosis not present

## 2021-01-27 DIAGNOSIS — J449 Chronic obstructive pulmonary disease, unspecified: Secondary | ICD-10-CM | POA: Diagnosis not present

## 2021-01-27 DIAGNOSIS — J9611 Chronic respiratory failure with hypoxia: Secondary | ICD-10-CM | POA: Diagnosis not present

## 2021-01-29 DIAGNOSIS — J449 Chronic obstructive pulmonary disease, unspecified: Secondary | ICD-10-CM | POA: Diagnosis not present

## 2021-01-29 DIAGNOSIS — E875 Hyperkalemia: Secondary | ICD-10-CM | POA: Diagnosis not present

## 2021-02-03 DIAGNOSIS — I2510 Atherosclerotic heart disease of native coronary artery without angina pectoris: Secondary | ICD-10-CM | POA: Diagnosis not present

## 2021-02-03 DIAGNOSIS — K219 Gastro-esophageal reflux disease without esophagitis: Secondary | ICD-10-CM | POA: Diagnosis not present

## 2021-02-03 DIAGNOSIS — Z87891 Personal history of nicotine dependence: Secondary | ICD-10-CM | POA: Diagnosis not present

## 2021-02-03 DIAGNOSIS — Z79899 Other long term (current) drug therapy: Secondary | ICD-10-CM | POA: Diagnosis not present

## 2021-02-03 DIAGNOSIS — Z9049 Acquired absence of other specified parts of digestive tract: Secondary | ICD-10-CM | POA: Diagnosis not present

## 2021-02-03 DIAGNOSIS — C4A9 Merkel cell carcinoma, unspecified: Secondary | ICD-10-CM | POA: Diagnosis not present

## 2021-02-03 DIAGNOSIS — Z85118 Personal history of other malignant neoplasm of bronchus and lung: Secondary | ICD-10-CM | POA: Diagnosis not present

## 2021-02-03 DIAGNOSIS — Z885 Allergy status to narcotic agent status: Secondary | ICD-10-CM | POA: Diagnosis not present

## 2021-02-03 DIAGNOSIS — C4A59 Merkel cell carcinoma of other part of trunk: Secondary | ICD-10-CM | POA: Diagnosis not present

## 2021-02-03 DIAGNOSIS — Z85821 Personal history of Merkel cell carcinoma: Secondary | ICD-10-CM | POA: Diagnosis not present

## 2021-02-03 DIAGNOSIS — C774 Secondary and unspecified malignant neoplasm of inguinal and lower limb lymph nodes: Secondary | ICD-10-CM | POA: Diagnosis not present

## 2021-02-03 DIAGNOSIS — J432 Centrilobular emphysema: Secondary | ICD-10-CM | POA: Diagnosis not present

## 2021-02-04 DIAGNOSIS — C774 Secondary and unspecified malignant neoplasm of inguinal and lower limb lymph nodes: Secondary | ICD-10-CM | POA: Diagnosis not present

## 2021-02-04 DIAGNOSIS — C4A59 Merkel cell carcinoma of other part of trunk: Secondary | ICD-10-CM | POA: Diagnosis not present

## 2021-02-04 DIAGNOSIS — Z85118 Personal history of other malignant neoplasm of bronchus and lung: Secondary | ICD-10-CM | POA: Diagnosis not present

## 2021-02-04 DIAGNOSIS — J432 Centrilobular emphysema: Secondary | ICD-10-CM | POA: Diagnosis not present

## 2021-02-04 DIAGNOSIS — Z885 Allergy status to narcotic agent status: Secondary | ICD-10-CM | POA: Diagnosis not present

## 2021-02-04 DIAGNOSIS — I2510 Atherosclerotic heart disease of native coronary artery without angina pectoris: Secondary | ICD-10-CM | POA: Diagnosis not present

## 2021-02-11 DIAGNOSIS — C4A9 Merkel cell carcinoma, unspecified: Secondary | ICD-10-CM | POA: Diagnosis not present

## 2021-02-11 DIAGNOSIS — J189 Pneumonia, unspecified organism: Secondary | ICD-10-CM | POA: Diagnosis not present

## 2021-03-12 DIAGNOSIS — C4A9 Merkel cell carcinoma, unspecified: Secondary | ICD-10-CM | POA: Diagnosis not present

## 2021-03-12 DIAGNOSIS — Z09 Encounter for follow-up examination after completed treatment for conditions other than malignant neoplasm: Secondary | ICD-10-CM | POA: Diagnosis not present

## 2021-03-25 DIAGNOSIS — E876 Hypokalemia: Secondary | ICD-10-CM | POA: Diagnosis not present

## 2021-03-26 DIAGNOSIS — H04123 Dry eye syndrome of bilateral lacrimal glands: Secondary | ICD-10-CM | POA: Diagnosis not present

## 2021-03-26 DIAGNOSIS — H401132 Primary open-angle glaucoma, bilateral, moderate stage: Secondary | ICD-10-CM | POA: Diagnosis not present

## 2021-03-31 NOTE — Patient Instructions (Incomplete)

## 2021-03-31 NOTE — Progress Notes (Deleted)
No chief complaint on file.    HISTORY OF PRESENT ILLNESS:  03/31/21 ALL:  Bailey Nguyen is a 75 y.o. female here today for follow up for facial paresthesias, headaches and visual distortions. She was seen in consult with Dr Rexene Alberts in 08/2019 and started on low dose gabapentin. MRI showed multilevel degenerative changes but otherwise unremarkable. She was advised to get a formal eye exam and consider spine specialist.    HISTORY (copied from Dr Guadelupe Sabin previous note)  Dear Dr. Lisbeth Ply,   I saw your patient, Bailey Nguyen, upon your kind request in my neurologic clinic today for initial consultation of her facial paresthesias.  The patient is unaccompanied today.  As you know, Bailey Nguyen is a 75 year old right-handed woman with an underlying medical history of lung cancer, glaucoma, reflux disease, COPD, smoking, chronic constipation, anxiety, osteoporosis, hyperlipidemia, TIA, Lyme disease, and borderline overweight state, who developed left lower face paresthesias and visual blurring in December 2020.  She reports that she has had lower facial tingling, particularly on the left side with some numbness around the face and lips for probably  longer than December 2020.  Symptoms are intermittent, may last a few minutes to a few hours.  The longest lasting symptoms made her go to the emergency room at [facility].  She denies any one-sided weakness or numbness in the hemibody.  She has a longstanding history of neck pain and sometimes the pain radiates up her neck and into the back of her head.  Recently, she had a severe right-sided headache.  She reports a history of migraines and visual auras preceding her migraines when she was much younger.  This was when she was in her 86s and 30s most likely and she has not had any significant migraines until recently.  She does endorse as a trigger possible stress and sleep deprivation.  She does not sleep very well.  She does not snore or wake up  gasping or has pauses in her breathing.  She does not keep a very set schedule.  She may go to bed around 2 or 3 AM and sleep till 9 AM.  Sometimes she tosses and turns all night.  She tries to hydrate well with water, drinks caffeine in the form of coffee about 3 to 4 cups/day.  She does not drink any alcohol and quit smoking over a year ago. She reports intermittent blurry vision which actually improved after her eye doctor started her on Restasis.  She reports visual distortions, including wavy lines, similar to her migraine auras but she does not always have a headache with it.  Her symptoms of facial paresthesias and numbness may happen every other day, she is not sure about the length or frequency of her symptoms in general.  It seems like her neck pain gets worse when she is driving or working at the computer.  She did have a neck MRI in 2019 which did show severe degenerative changes.  She reports that she has not seen a spine specialist.  She did have lower back surgery some years ago and has stable numbness in her left leg in the distal lateral portion including the lateral aspect of her left foot.   REVIEW OF SYSTEMS: Out of a complete 14 system review of symptoms, the patient complains only of the following symptoms, and all other reviewed systems are negative.   ALLERGIES: No Known Allergies   HOME MEDICATIONS: Outpatient Medications Prior to Visit  Medication Sig Dispense Refill  albuterol (VENTOLIN HFA) 108 (90 Base) MCG/ACT inhaler Inhale 2 puffs into the lungs every 6 (six) hours as needed for wheezing or shortness of breath.     apixaban (ELIQUIS) 5 MG TABS tablet Take 2.5 mg by mouth 2 (two) times daily.     brinzolamide (AZOPT) 1 % ophthalmic suspension Place 1 drop into the right eye 2 (two) times daily.     Budeson-Glycopyrrol-Formoterol (BREZTRI AEROSPHERE) 160-9-4.8 MCG/ACT AERO Inhale 2 puffs into the lungs in the morning and at bedtime.     Cholecalciferol 25 MCG (1000 UT)  tablet Take 1,000 Units by mouth daily.     cyclobenzaprine (FLEXERIL) 5 MG tablet Take 5 mg by mouth 3 (three) times daily as needed. (back muscle pain)  1   cycloSPORINE (RESTASIS) 0.05 % ophthalmic emulsion Place 1 drop into both eyes in the morning and at bedtime. 2x day every 12 hours.     EPINEPHrine 0.3 mg/0.3 mL IJ SOAJ injection Inject 0.3 mg into the muscle as needed for anaphylaxis.     ipratropium-albuterol (DUONEB) 0.5-2.5 (3) MG/3ML SOLN Take 3 mLs by nebulization every 6 (six) hours as needed (asthma).     OXYGEN Inhale 2 L into the lungs at bedtime.     pantoprazole (PROTONIX) 40 MG tablet Take 1 tablet (40 mg total) by mouth 2 (two) times daily. 60 tablet 11   polyethylene glycol (MIRALAX / GLYCOLAX) packet Take 17 g by mouth daily as needed for constipation.     promethazine (PHENERGAN) 25 MG tablet TAKE 1 TABLET BY MOUTH EVERY 6 HOURS AS NEEDED FOR NAUSEA OR VOMITING. 30 tablet 0   rosuvastatin (CRESTOR) 40 MG tablet Take 40 mg by mouth daily.     sodium chloride 0.9 % nebulizer solution Take 3 mLs by nebulization as needed for wheezing.     No facility-administered medications prior to visit.     PAST MEDICAL HISTORY: Past Medical History:  Diagnosis Date   Anxiety    Back ache    Cataract    il cataracts removed   Chest wall pain    Chronic constipation    COPD (chronic obstructive pulmonary disease) (HCC)    Coronary artery calcification seen on CT scan 01/01/2016   Diverticula of intestine    sigmond   Dyskinesia of esophagus    Emphysema of lung (HCC)    GERD (gastroesophageal reflux disease)    Glaucoma    History of COVID-19 05/2020   Hx of colonic polyps    Hx of Lyme disease    Hx of TIA (transient ischemic attack) and stroke    Hypercholesteremia    Lung cancer ([place])    Removed upper right portion of lung    Neck pain    Osteoporosis    Prediabetes    Splenic infarct    Stroke ([place])    TIA   Tobacco use disorder      PAST SURGICAL  HISTORY: Past Surgical History:  Procedure Laterality Date   BACK SURGERY     BIOPSY  07/09/2020   Procedure: BIOPSY;  Surgeon: Jackquline Denmark, MD;  [facility]: [facility];  Service: Endoscopy;;   CHOLECYSTECTOMY     COLONOSCOPY  09/22/2013   Moderate predominantly sigmoid diverticulosis. Moderate internal hemorrhoids. Otherwise normal colonoscopy. Limited due to qualityt of prep/.   ESOPHAGOGASTRODUODENOSCOPY  03/05/2017   Peptic esophageal stricture. Gastritis.    ESOPHAGOGASTRODUODENOSCOPY (EGD) WITH PROPOFOL N/A 07/09/2020   Procedure: ESOPHAGOGASTRODUODENOSCOPY (EGD) WITH PROPOFOL;  Surgeon: Jackquline Denmark, MD;  [facility];  Service: Endoscopy;  Laterality: N/A;   LUNG REMOVAL, PARTIAL Right 01/2015   LUNG SURGERY Left 07/24/2018   MALONEY DILATION  07/09/2020   Procedure: Venia Minks DILATION;  Surgeon: Jackquline Denmark, MD;  [facility];  Service: Endoscopy;;   TUBAL LIGATION       FAMILY HISTORY: Family History  Problem Relation Age of Onset   Diabetes Sister    Diabetes Brother    Lung cancer Brother    Diabetes Father    Pancreatic cancer Father    Coronary artery disease Mother 82   Thyroid cancer Daughter    Colon cancer Neg Hx    Esophageal cancer Neg Hx    Stomach cancer Neg Hx    Rectal cancer Neg Hx      SOCIAL HISTORY: Social History   Socioeconomic History   Marital status: Married    Spouse name: Not on file   Number of children: 5   Years of education: Not on file   Highest education level: Not on file  Occupational History   Not on file  Tobacco Use   Smoking status: Former    Packs/day: 1.00    Types: Cigarettes    Quit date: 02/01/2015    Years since quitting: 6.1   Smokeless tobacco: Never  Vaping Use   Vaping Use: Former   Devices: Tried it before for a week  Substance and Sexual Activity   Alcohol use: No    Alcohol/week: 0.0 standard drinks   Drug use: No   Sexual activity: Not on file  Other Topics Concern   Not on  file  Social History Narrative   Not on file   Social Determinants of Health   Financial Resource Strain: Not on file  Food Insecurity: Not on file  Transportation Needs: Not on file  Physical Activity: Not on file  Stress: Not on file  Social Connections: Not on file  Intimate Partner Violence: Not on file     PHYSICAL EXAM  There were no vitals filed for this visit. There is no height or weight on file to calculate BMI.  Generalized: Well developed, in no acute distress  Cardiology: normal rate and rhythm, no murmur auscultated  Respiratory: clear to auscultation bilaterally    Neurological examination  Mentation: Alert oriented to time, place, history taking. Follows all commands speech and language fluent Cranial nerve II-XII: Pupils were equal round reactive to light. Extraocular movements were full, visual field were full on confrontational test. Facial sensation and strength were normal. Uvula tongue midline. Head turning and shoulder shrug  were normal and symmetric. Motor: The motor testing reveals 5 over 5 strength of all 4 extremities. Good symmetric motor tone is noted throughout.  Sensory: Sensory testing is intact to soft touch on all 4 extremities. No evidence of extinction is noted.  Coordination: Cerebellar testing reveals good finger-nose-finger and heel-to-shin bilaterally.  Gait and station: Gait is normal. Tandem gait is normal. Romberg is negative. No drift is seen.  Reflexes: Deep tendon reflexes are symmetric and normal bilaterally.    DIAGNOSTIC DATA (LABS, IMAGING, TESTING) - I reviewed patient records, labs, notes, testing and imaging myself where available.  Lab Results  Component Value Date   WBC 7.0 10/27/2020   HGB 14.1 10/27/2020   HCT 43.7 10/27/2020   MCV 90.7 10/27/2020   PLT 335 10/27/2020      Component Value Date/Time   NA 141 10/27/2020 2218   NA 142 08/29/2019 0947  K 3.8 10/27/2020 2218   CL 110 10/27/2020 2218   CO2 20  (L) 10/27/2020 2218   GLUCOSE 264 (H) 10/27/2020 2218   BUN 20 10/27/2020 2218   BUN 14 08/29/2019 0947   CREATININE 0.92 10/27/2020 2218   CALCIUM 9.7 10/27/2020 2218   PROT 7.4 08/29/2019 0947   ALBUMIN 4.6 08/29/2019 0947   AST 13 08/29/2019 0947   ALT 13 08/29/2019 0947   ALKPHOS 88 08/29/2019 0947   BILITOT 0.2 08/29/2019 0947   GFRNONAA >60 10/27/2020 2218   GFRAA 68 08/29/2019 0947   No results found for: CHOL, HDL, LDLCALC, LDLDIRECT, TRIG, CHOLHDL No results found for: HGBA1C No results found for: VITAMINB12 No results found for: TSH  No flowsheet data found.   No flowsheet data found.   ASSESSMENT AND PLAN  75 y.o. year old female  has a past medical history of Anxiety, Back ache, Cataract, Chest wall pain, Chronic constipation, COPD (chronic obstructive pulmonary disease) ([place]), Coronary artery calcification seen on CT scan (01/01/2016), Diverticula of intestine, Dyskinesia of esophagus, Emphysema of lung ([place]), GERD (gastroesophageal reflux disease), Glaucoma, History of COVID-19 (05/2020), colonic polyps, Lyme disease, TIA (transient ischemic attack) and stroke, Hypercholesteremia, Lung cancer ([place]), Neck pain, Osteoporosis, Prediabetes, Splenic infarct, Stroke ([place]), and Tobacco use disorder. here with    Facial paresthesia  Chronic nonintractable headache, unspecified headache type  Chronic neck pain  Visual distortions   No orders of the defined types were placed in this encounter.    No orders of the defined types were placed in this encounter.     Debbora Presto, MSN, FNP-C 03/31/2021, 8:57 AM  [facility] [address] [place], [place] [address] [phone]

## 2021-04-01 ENCOUNTER — Ambulatory Visit: Payer: Medicare Other | Admitting: Family Medicine

## 2021-04-02 DIAGNOSIS — Z139 Encounter for screening, unspecified: Secondary | ICD-10-CM | POA: Diagnosis not present

## 2021-04-02 DIAGNOSIS — E876 Hypokalemia: Secondary | ICD-10-CM | POA: Diagnosis not present

## 2021-04-02 DIAGNOSIS — Z6823 Body mass index (BMI) 23.0-23.9, adult: Secondary | ICD-10-CM | POA: Diagnosis not present

## 2021-04-02 DIAGNOSIS — E7800 Pure hypercholesterolemia, unspecified: Secondary | ICD-10-CM | POA: Diagnosis not present

## 2021-04-02 DIAGNOSIS — K219 Gastro-esophageal reflux disease without esophagitis: Secondary | ICD-10-CM | POA: Diagnosis not present

## 2021-04-02 DIAGNOSIS — E1165 Type 2 diabetes mellitus with hyperglycemia: Secondary | ICD-10-CM | POA: Diagnosis not present

## 2021-04-14 DIAGNOSIS — M5412 Radiculopathy, cervical region: Secondary | ICD-10-CM | POA: Diagnosis not present

## 2021-04-14 DIAGNOSIS — G629 Polyneuropathy, unspecified: Secondary | ICD-10-CM | POA: Diagnosis not present

## 2021-04-16 DIAGNOSIS — Z20822 Contact with and (suspected) exposure to covid-19: Secondary | ICD-10-CM | POA: Diagnosis not present

## 2021-04-16 DIAGNOSIS — K219 Gastro-esophageal reflux disease without esophagitis: Secondary | ICD-10-CM | POA: Diagnosis not present

## 2021-04-16 DIAGNOSIS — R350 Frequency of micturition: Secondary | ICD-10-CM | POA: Diagnosis not present

## 2021-04-16 DIAGNOSIS — E876 Hypokalemia: Secondary | ICD-10-CM | POA: Diagnosis not present

## 2021-04-16 DIAGNOSIS — Z79899 Other long term (current) drug therapy: Secondary | ICD-10-CM | POA: Diagnosis not present

## 2021-04-16 DIAGNOSIS — R3589 Other polyuria: Secondary | ICD-10-CM | POA: Diagnosis not present

## 2021-04-16 DIAGNOSIS — I2510 Atherosclerotic heart disease of native coronary artery without angina pectoris: Secondary | ICD-10-CM | POA: Diagnosis not present

## 2021-04-16 DIAGNOSIS — J449 Chronic obstructive pulmonary disease, unspecified: Secondary | ICD-10-CM | POA: Diagnosis not present

## 2021-04-16 DIAGNOSIS — Z87891 Personal history of nicotine dependence: Secondary | ICD-10-CM | POA: Diagnosis not present

## 2021-04-17 DIAGNOSIS — Z20822 Contact with and (suspected) exposure to covid-19: Secondary | ICD-10-CM | POA: Diagnosis not present

## 2021-04-21 DIAGNOSIS — E876 Hypokalemia: Secondary | ICD-10-CM | POA: Diagnosis not present

## 2021-04-22 DIAGNOSIS — C7B1 Secondary Merkel cell carcinoma: Secondary | ICD-10-CM | POA: Diagnosis not present

## 2021-04-22 DIAGNOSIS — R918 Other nonspecific abnormal finding of lung field: Secondary | ICD-10-CM | POA: Diagnosis not present

## 2021-04-22 DIAGNOSIS — C4A9 Merkel cell carcinoma, unspecified: Secondary | ICD-10-CM | POA: Diagnosis not present

## 2021-04-25 ENCOUNTER — Ambulatory Visit: Payer: Medicare Other | Admitting: Gastroenterology

## 2021-04-25 DIAGNOSIS — C4A9 Merkel cell carcinoma, unspecified: Secondary | ICD-10-CM | POA: Diagnosis not present

## 2021-04-25 DIAGNOSIS — J441 Chronic obstructive pulmonary disease with (acute) exacerbation: Secondary | ICD-10-CM | POA: Diagnosis not present

## 2021-05-06 DIAGNOSIS — M5412 Radiculopathy, cervical region: Secondary | ICD-10-CM | POA: Diagnosis not present

## 2021-05-06 DIAGNOSIS — M9951 Intervertebral disc stenosis of neural canal of cervical region: Secondary | ICD-10-CM | POA: Diagnosis not present

## 2021-05-06 DIAGNOSIS — M5114 Intervertebral disc disorders with radiculopathy, thoracic region: Secondary | ICD-10-CM | POA: Diagnosis not present

## 2021-05-06 DIAGNOSIS — M50322 Other cervical disc degeneration at C5-C6 level: Secondary | ICD-10-CM | POA: Diagnosis not present

## 2021-05-06 DIAGNOSIS — M4802 Spinal stenosis, cervical region: Secondary | ICD-10-CM | POA: Diagnosis not present

## 2021-05-06 DIAGNOSIS — M4722 Other spondylosis with radiculopathy, cervical region: Secondary | ICD-10-CM | POA: Diagnosis not present

## 2021-05-08 ENCOUNTER — Ambulatory Visit: Payer: Medicare Other | Admitting: Gastroenterology

## 2021-05-08 DIAGNOSIS — H401132 Primary open-angle glaucoma, bilateral, moderate stage: Secondary | ICD-10-CM | POA: Diagnosis not present

## 2021-05-08 DIAGNOSIS — H04123 Dry eye syndrome of bilateral lacrimal glands: Secondary | ICD-10-CM | POA: Diagnosis not present

## 2021-05-28 DIAGNOSIS — M5416 Radiculopathy, lumbar region: Secondary | ICD-10-CM | POA: Diagnosis not present

## 2021-05-28 DIAGNOSIS — G629 Polyneuropathy, unspecified: Secondary | ICD-10-CM | POA: Diagnosis not present

## 2021-06-09 IMAGING — US US BIOPSY LYMPH NODE
1 series · 12 of 12 positions shown · non-contrast
Comparison: none

INDICATION: 75-year-old female with left inguinal lymphadenopathy of
indeterminate etiology.

[Series 1: us core biopsy (lymph nodes) · 12 of 12 slices shown]
[im 1/12]
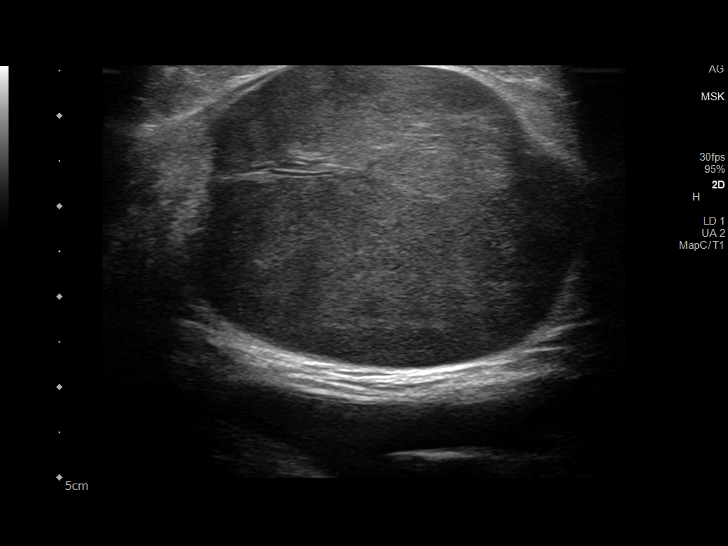
[im 2/12]
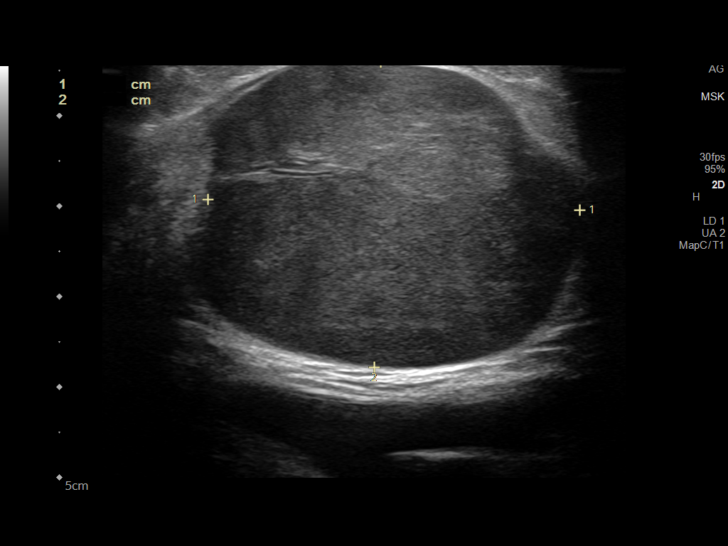
[im 3/12]
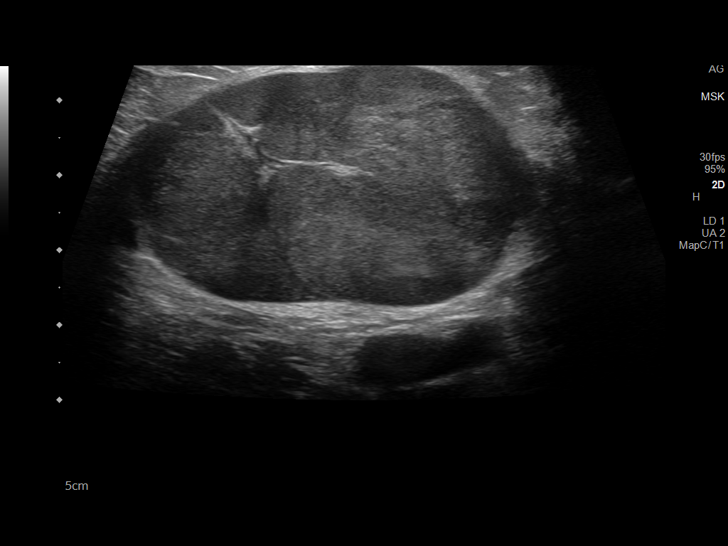
[im 4/12]
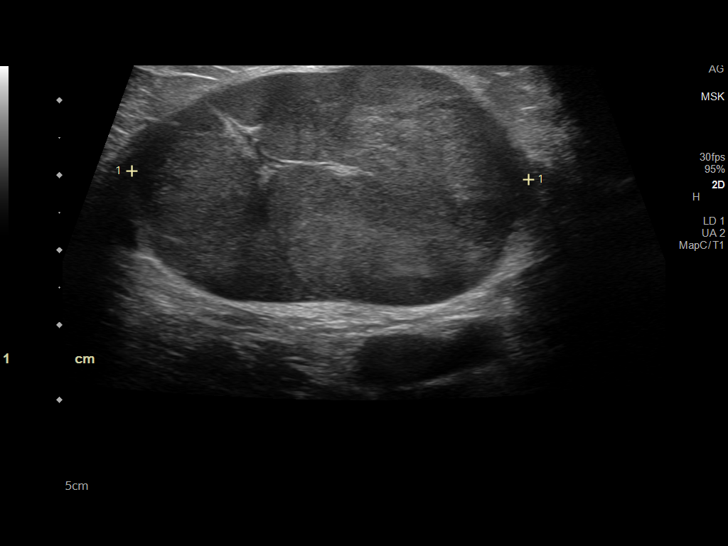
[im 5/12]
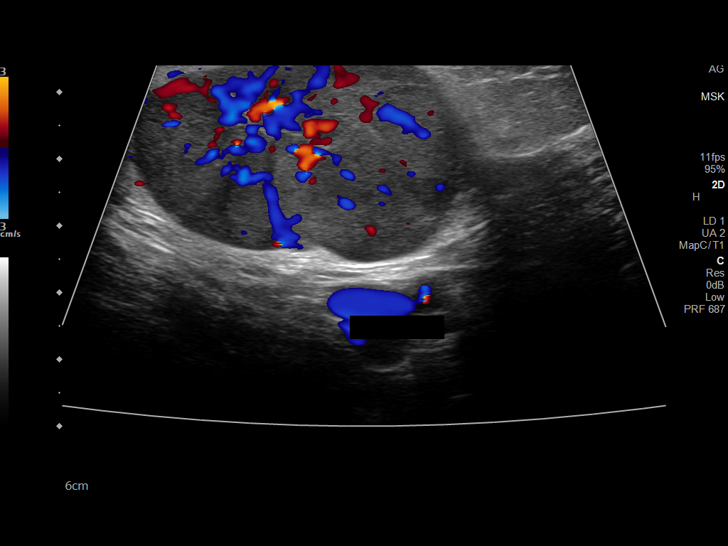
[im 6/12]
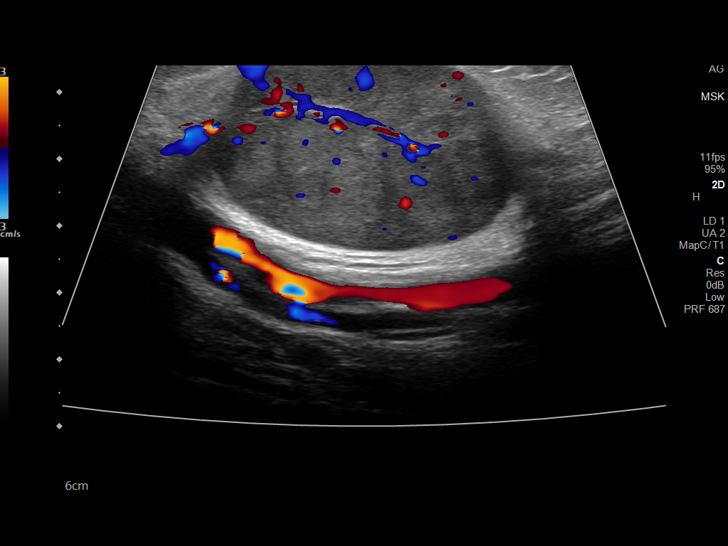
[im 7/12]
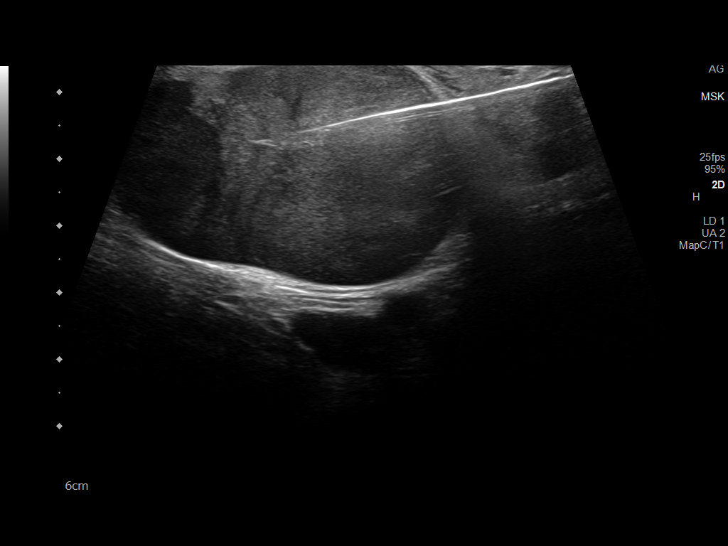
[im 8/12]
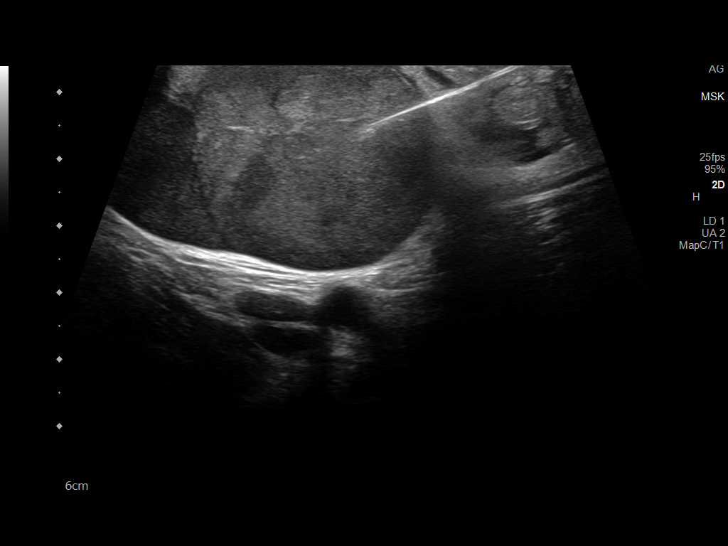
[im 9/12]
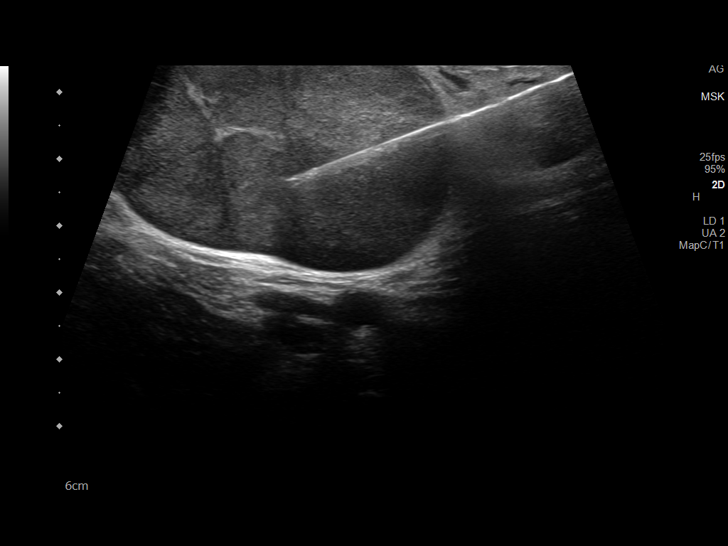
[im 10/12]
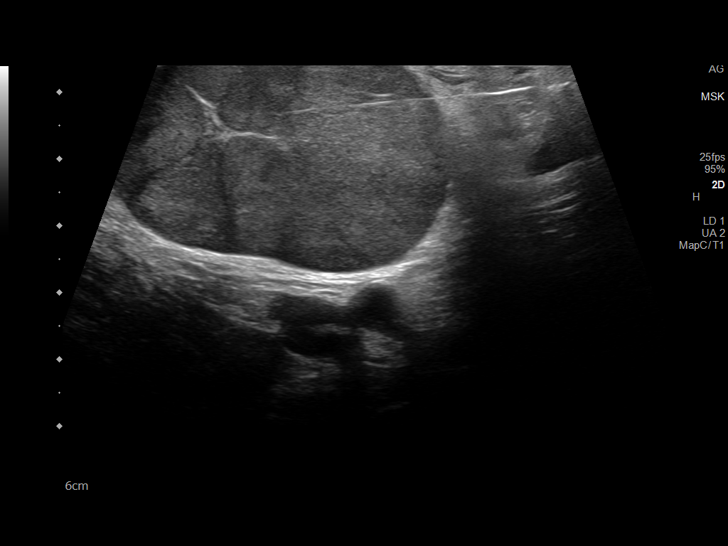
[im 11/12]
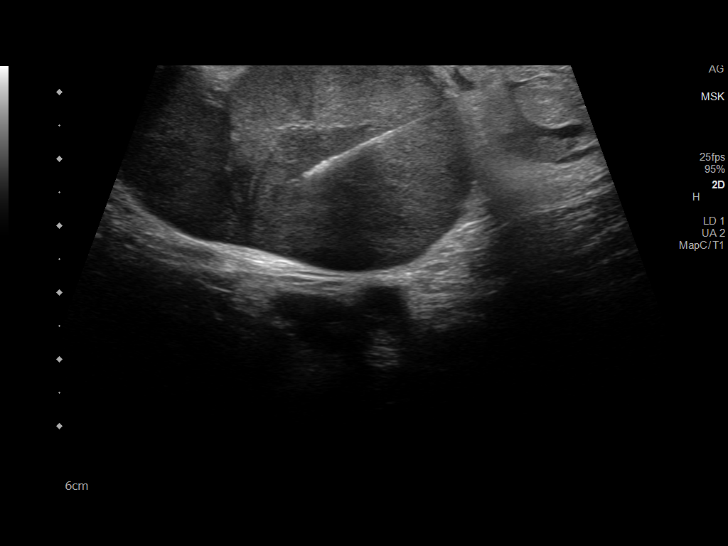
[im 12/12]
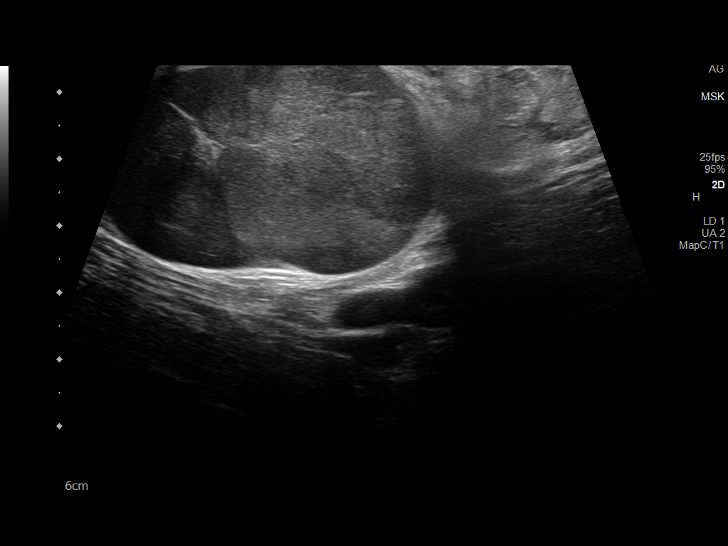

[12 of 12 positions shown; findings below may reference images not displayed]

EXAM:
ULTRASOUND GUIDED NEEDLE ASPIRATE/CORE/NEEDLE ASPIRATE AND CORE
BIOPSY OF BODY PART

MEDICATIONS:
None.

ANESTHESIA/SEDATION:
Fentanyl 50 mcg IV; Versed 1.5 mg IV

Moderate Sedation Time:  12

The patient was continuously monitored during the procedure by the
interventional radiology nurse under my direct supervision.

PROCEDURE:
The procedure, risks, benefits, and alternatives were explained to
the patient. Questions regarding the procedure were encouraged and
answered. The patient understands and consents to the procedure.

The left groin was prepped with chlorhexidine in a sterile fashion,
and a sterile drape was applied covering the operative field. A
sterile gown and sterile gloves were used for the procedure.
Subdermal local anesthesia was provided with 1% Lidocaine at the
planned needle entry site. A small skin nick was made. Additional
deeper local anesthetic was administered under ultrasound guidance
to the level of the large (5.3 cm) left inguinal lymph node.

Under direct ultrasound guidance, an 18 gauge core biopsy needle was
passed to the periphery of the lymph node and a total of 5 samples
were obtained. The samples were placed on saline soaked Telfa and
placed in a sterile container.

The patient tolerated the procedure well without immediate
complication. Postprocedural imaging demonstrated no evidence of
surrounding hematoma.

COMPLICATIONS:
None immediate.
FINDINGS: Technically successful ultrasound-guided left inguinal lymph node
biopsy, 18 gauge x 5.
IMPRESSION: Technically successful left inguinal lymph node core biopsy.

## 2021-06-16 ENCOUNTER — Telehealth: Payer: Self-pay | Admitting: Gastroenterology

## 2021-06-16 NOTE — Telephone Encounter (Signed)
Pt states that she is requesting sooner appointment than the one offered. Pt states that she feels that she may have Colon Cancer: Pt states that she is having lower abdominal pain along with Change in Bowel Patters and Consistency: Pt was rescheduled to see Nicoletta Ba PA on 06/27/2011 at 1:30: Pt made aware.  Address Provided: Pt verbalized understanding with all questions answered.

## 2021-06-16 NOTE — Telephone Encounter (Signed)
Patient called requesting an appointment with Dr. Lyndel Safe.  The first one offered to her was for 07/09/21 at 1:30 p.m.  Patient feels like she might "have colon cancer."  She is having very thin stools about every hour with abdominal discomfort.  Recently, she has not noticed any blood in her stools, but she has had it in the past.  She would like an appointment sooner rather than later.  Please call patient and advise.  Thank you.

## 2021-06-17 DIAGNOSIS — J449 Chronic obstructive pulmonary disease, unspecified: Secondary | ICD-10-CM | POA: Diagnosis not present

## 2021-06-17 DIAGNOSIS — R069 Unspecified abnormalities of breathing: Secondary | ICD-10-CM | POA: Diagnosis not present

## 2021-06-17 DIAGNOSIS — R1032 Left lower quadrant pain: Secondary | ICD-10-CM | POA: Diagnosis not present

## 2021-06-17 DIAGNOSIS — C4A59 Merkel cell carcinoma of other part of trunk: Secondary | ICD-10-CM | POA: Diagnosis not present

## 2021-06-17 DIAGNOSIS — Z9981 Dependence on supplemental oxygen: Secondary | ICD-10-CM | POA: Diagnosis not present

## 2021-06-17 DIAGNOSIS — R0602 Shortness of breath: Secondary | ICD-10-CM | POA: Diagnosis not present

## 2021-06-17 DIAGNOSIS — Z79899 Other long term (current) drug therapy: Secondary | ICD-10-CM | POA: Diagnosis not present

## 2021-06-17 DIAGNOSIS — K921 Melena: Secondary | ICD-10-CM | POA: Diagnosis not present

## 2021-06-17 DIAGNOSIS — Z8673 Personal history of transient ischemic attack (TIA), and cerebral infarction without residual deficits: Secondary | ICD-10-CM | POA: Diagnosis not present

## 2021-06-17 DIAGNOSIS — Z87891 Personal history of nicotine dependence: Secondary | ICD-10-CM | POA: Diagnosis not present

## 2021-06-17 DIAGNOSIS — Z7952 Long term (current) use of systemic steroids: Secondary | ICD-10-CM | POA: Diagnosis not present

## 2021-06-17 DIAGNOSIS — I2510 Atherosclerotic heart disease of native coronary artery without angina pectoris: Secondary | ICD-10-CM | POA: Diagnosis not present

## 2021-06-17 DIAGNOSIS — Z85118 Personal history of other malignant neoplasm of bronchus and lung: Secondary | ICD-10-CM | POA: Diagnosis not present

## 2021-06-17 DIAGNOSIS — I82412 Acute embolism and thrombosis of left femoral vein: Secondary | ICD-10-CM | POA: Diagnosis not present

## 2021-06-17 DIAGNOSIS — E785 Hyperlipidemia, unspecified: Secondary | ICD-10-CM | POA: Diagnosis not present

## 2021-06-17 DIAGNOSIS — I10 Essential (primary) hypertension: Secondary | ICD-10-CM | POA: Diagnosis not present

## 2021-06-20 DIAGNOSIS — I824Y2 Acute embolism and thrombosis of unspecified deep veins of left proximal lower extremity: Secondary | ICD-10-CM | POA: Diagnosis not present

## 2021-06-20 DIAGNOSIS — R0602 Shortness of breath: Secondary | ICD-10-CM | POA: Diagnosis not present

## 2021-06-26 ENCOUNTER — Ambulatory Visit: Payer: Medicare Other | Admitting: Physician Assistant

## 2021-07-09 ENCOUNTER — Ambulatory Visit: Payer: Medicare Other | Admitting: Gastroenterology

## 2021-07-16 DIAGNOSIS — R252 Cramp and spasm: Secondary | ICD-10-CM | POA: Diagnosis not present

## 2021-07-21 ENCOUNTER — Ambulatory Visit (INDEPENDENT_AMBULATORY_CARE_PROVIDER_SITE_OTHER): Payer: Medicare Other | Admitting: Gastroenterology

## 2021-07-21 ENCOUNTER — Encounter: Payer: Self-pay | Admitting: Gastroenterology

## 2021-07-21 ENCOUNTER — Other Ambulatory Visit: Payer: Self-pay

## 2021-07-21 VITALS — BP 126/72 | HR 97 | Ht 65.0 in | Wt 134.4 lb

## 2021-07-21 DIAGNOSIS — K449 Diaphragmatic hernia without obstruction or gangrene: Secondary | ICD-10-CM

## 2021-07-21 DIAGNOSIS — K219 Gastro-esophageal reflux disease without esophagitis: Secondary | ICD-10-CM | POA: Diagnosis not present

## 2021-07-21 DIAGNOSIS — R634 Abnormal weight loss: Secondary | ICD-10-CM

## 2021-07-21 DIAGNOSIS — R112 Nausea with vomiting, unspecified: Secondary | ICD-10-CM | POA: Diagnosis not present

## 2021-07-21 DIAGNOSIS — R1310 Dysphagia, unspecified: Secondary | ICD-10-CM

## 2021-07-21 MED ORDER — AMBULATORY NON FORMULARY MEDICATION: 2 refills | Status: DC

## 2021-07-21 MED ORDER — PANTOPRAZOLE SODIUM 40 MG PO TBEC: 40.0000 mg | DELAYED_RELEASE_TABLET | Freq: Every day | ORAL | 4 refills | Status: DC

## 2021-07-21 NOTE — Patient Instructions (Addendum)
If you are age 74 or older, your body mass index should be between 23-30. Your Body mass index is 22.36 kg/m?Marland Kitchen If this is out of the aforementioned range listed, please consider follow up with your Primary Care Provider. ? ?If you are age 59 or younger, your body mass index should be between 19-25. Your Body mass index is 22.36 kg/m?Marland Kitchen If this is out of the aformentioned range listed, please consider follow up with your Primary Care Provider.  ? ?________________________________________________________ ? ?The [facility] providers would like to encourage you to use [facility] to communicate with providers for non-urgent requests or questions.  Due to long hold times on the telephone, sending your provider a message by [facility] may be a faster and more efficient way to get a response.  Please allow 48 business hours for a response.  Please remember that this is for non-urgent requests.  ?_______________________________________________________ ? ?We have sent the following medications to your pharmacy for you to pick up at your convenience: ?Protonix ?GI cocktail-a script has been given ? ?Follow up in 6 months by calling to schedule a follow up appointment. ? ?Thank you, ? ?Dr. Jackquline Denmark ? ?

## 2021-07-21 NOTE — Progress Notes (Signed)
Chief Complaint: FU  Referring Provider:  Leonides Sake, MD      ASSESSMENT AND PLAN;   #1. GERD with small HH with occ dysphagia d/t presbyesophagus s/p multiple eso dilatations previously. Last dil 54Fr 07/2020  #3. Associated COPD on home O2, 3V CAD, chronic neck and low back pain, HLD, anxiety.  #4.  History of Merkel cell carcinoma, H/O Lung AdenoCa s/p RUL lobectomy 01/2015, another lung adeno CA  LLL lobe s/p wedge resection 07/2018.  Recent DVT on Eliquis.  Plan: -Protonix 40mg  po QD #90 4RF -GI cocktail PRN 15 cc po Q8hrs prn #16ml -Wants to hold off on colon. I do agree.  Encouraged her to do Cologuard at home. -FU in 6 months.   HPI:    Bailey Nguyen is a 76 y.o. female  Follow-up With H/O COPD on O2, Ca lung as above, history of Merkel cell carcinoma, DVT on Eliquis   For follow-up visit.  Most recently underwent CT Abdo/pelvis February 2023 and was found to have L common femoral vein thrombosis.  She has since been started on Eliquis.  After few days left lower quadrant abdominal pain had completely resolved.  Dysphagia is better after dilation 07/2020.  Still has some problems especially with magnesium pills.  Wants refills of Protonix and GI cocktail which he uses on as-needed basis.  No heartburn.  No fever chills or night sweats.  Her appetite is good.  PET scan scheduled on 07/25/2021 by oncology.  Denies having any heartburn, regurgitation, odynophagia.  No fever or chills.  No nausea or vomiting.  Has Cologuard test at home - didn't get around doing it now. Wants to wait until she gets off eliquis.  She does understand if Cologuard is positive, she may have to undergo colonoscopy.  Has lost weight as below Wt Readings from Last 3 Encounters:  07/21/21 134 lb 6 oz (61 kg)  10/27/20 147 lb (66.7 kg)  07/08/20 145 lb (65.8 kg)       Previous GI work-up:  EGD with dil 07/09/2020 -Moderate gastritis. -Presbyesophagus s/p esophageal dilatation  54Fr  CT 06/17/2021 AP with contrast 1.No acute abnormality within the abdomen or pelvis.  2.Partially occlusive thrombus of the left common femoral vein.  There is no central propagation, as the left external and common iliac veins are ligated.  3.Additional postsurgical changes in the left groin are grossly unchanged. No new lymphadenopathy.  4.Extensive dilated pelvic veins with large cross-pelvic collaterals.  CT Abdo/pelvis with contrast 05/25/2020 at [place] enlarged left inguinal and left external iliac lymph nodes concerning for metastatic disease.  Otherwise no intra-abdominal abnormalities. -Diverticulosis -Small low-attenuation areas in the liver as before.  CT chest 06/07/20 No pulmonary embolism.   Interval development of extensive pulmonary infiltrate compatible  with given history of COVID-19 pneumonia predominantly throughout  the left lung. Associated shotty mediastinal and left hilar  adenopathy is reactive in nature.   Moderate to severe emphysema.   Aortic Atherosclerosis (ICD10-I70.0) and Emphysema (ICD10-J43.9).   -S/P EGDs with dil in 2013, 2016 and 2018, 2020 with good relief  Had colonoscopy 09/2013-limited due to preparation, grossly negative except for moderate sigmoid diverticulosis and internal hemorrhoids. Does not want repeat colonoscopy.  CT Chest 04/2019 1. No evidence of local tumor recurrence status post right upper lobectomy and medial left lower lobe wedge resection. 2. No findings of metastatic disease in the chest. 3. Three-vessel coronary atherosclerosis.  PET 06/2018 Abdomen/Pelvis: - Liver: Multiple subcentimeter hepatic hypodensities  are unchanged from 2016 activity and exhibit no FDG uptake. Scattered parenchymal calcifications are noted. - Gallbladder: Surgically absent. - Spleen: No splenomegaly. No focal abnormalities. - Pancreas: No focal abnormality - Adrenal glands: Unremarkable - Kidneys: Unremarkable - GI Tract:  Colonic diverticulosis. - GU Tract: Unremarkable - Adenopathy: None  Past Medical History:  Diagnosis Date   Anxiety    Back ache    Cataract    il cataracts removed   Chest wall pain    Chronic constipation    COPD (chronic obstructive pulmonary disease) (HCC)    Coronary artery calcification seen on CT scan 01/01/2016   Diverticula of intestine    [place]   Dyskinesia of esophagus    Emphysema of lung (HCC)    GERD (gastroesophageal reflux disease)    Glaucoma    History of COVID-19 05/2020   Hx of colonic polyps    Hx of Lyme disease    Hx of TIA (transient ischemic attack) and stroke    Hypercholesteremia    Lung cancer ([place])    Removed upper right portion of lung    Neck pain    Osteoporosis    Prediabetes    Splenic infarct    Stroke [facility])    TIA   Tobacco use disorder     Past Surgical History:  Procedure Laterality Date   BACK SURGERY     BIOPSY  07/09/2020   Procedure: BIOPSY;  Surgeon: Jackquline Denmark, MD;  Location: [facility];  Service: Endoscopy;;   CHOLECYSTECTOMY     COLONOSCOPY  09/22/2013   Moderate predominantly sigmoid diverticulosis. Moderate internal hemorrhoids. Otherwise normal colonoscopy. Limited due to qualityt of prep/.   ESOPHAGOGASTRODUODENOSCOPY  03/05/2017   Peptic esophageal stricture. Gastritis.    ESOPHAGOGASTRODUODENOSCOPY (EGD) WITH PROPOFOL N/A 07/09/2020   Procedure: ESOPHAGOGASTRODUODENOSCOPY (EGD) WITH PROPOFOL;  Surgeon: Jackquline Denmark, MD;  Location: [facility];  Service: Endoscopy;  Laterality: N/A;   LUNG REMOVAL, PARTIAL Right 01/2015   LUNG SURGERY Left 07/24/2018   MALONEY DILATION  07/09/2020   Procedure: MALONEY DILATION;  Surgeon: Jackquline Denmark, MD;  Location: [facility];  Service: Endoscopy;;   TUBAL LIGATION      Family History  Problem Relation Age of Onset   Diabetes Sister    Diabetes Brother    Lung cancer Brother    Diabetes Father    Pancreatic cancer Father    Coronary artery disease Mother 20    Thyroid cancer Daughter    Colon cancer Neg Hx    Esophageal cancer Neg Hx    Stomach cancer Neg Hx    Rectal cancer Neg Hx     Social History   Tobacco Use   Smoking status: Former    Packs/day: 1.00    Types: Cigarettes    Quit date: 02/01/2015    Years since quitting: 6.4   Smokeless tobacco: Never  Vaping Use   Vaping Use: Former   Devices: Tried it before for a week  Substance Use Topics   Alcohol use: No    Alcohol/week: 0.0 standard drinks   Drug use: No    Current Outpatient Medications  Medication Sig Dispense Refill   albuterol (VENTOLIN HFA) 108 (90 Base) MCG/ACT inhaler Inhale 2 puffs into the lungs every 6 (six) hours as needed for wheezing or shortness of breath.     apixaban (ELIQUIS) 5 MG TABS tablet Take 2.5 mg by mouth 2 (two) times daily.     brinzolamide (AZOPT) 1 % ophthalmic suspension Place 1  drop into the right eye 2 (two) times daily.     Cholecalciferol 25 MCG (1000 UT) tablet Take 1,000 Units by mouth daily.     cyclobenzaprine (FLEXERIL) 5 MG tablet Take 5 mg by mouth 3 (three) times daily as needed. (back muscle pain)  1   cycloSPORINE (RESTASIS) 0.05 % ophthalmic emulsion Place 1 drop into both eyes in the morning and at bedtime. 2x day every 12 hours.     EPINEPHrine 0.3 mg/0.3 mL IJ SOAJ injection Inject 0.3 mg into the muscle as needed for anaphylaxis.     ipratropium-albuterol (DUONEB) 0.5-2.5 (3) MG/3ML SOLN Take 3 mLs by nebulization every 6 (six) hours as needed (asthma).     OXYGEN Inhale 2 L into the lungs at bedtime.     pantoprazole (PROTONIX) 40 MG tablet Take 1 tablet (40 mg total) by mouth 2 (two) times daily. 60 tablet 11   polyethylene glycol (MIRALAX / GLYCOLAX) packet Take 17 g by mouth daily as needed for constipation.     rosuvastatin (CRESTOR) 40 MG tablet Take 40 mg by mouth daily.     sodium chloride 0.9 % nebulizer solution Take 3 mLs by nebulization as needed for wheezing.     No current facility-administered medications  for this visit.    No Known Allergies  Review of Systems:  Negative except for HPI.     Physical Exam:    BP 126/72    Pulse 97    Ht 5\' 5"  (1.651 m)    Wt 134 lb 6 oz (61 kg)    SpO2 100%    BMI 22.36 kg/m  Filed Weights   07/21/21 1459  Weight: 134 lb 6 oz (61 kg)   Constitutional:  Gets dyspneic on minimal exertion.  Cachectic female Psychiatric: Normal mood and affect. Behavior is normal. HEENT: Pupils normal.  Conjunctivae are normal. No scleral icterus.  No thrush. Neck supple.  Cardiovascular: Normal rate, regular rhythm. No edema Pulmonary/chest: Bilateral decreased breath sounds.  No wheezing or rhonchi. Abdominal: Soft, nondistended. Nontender. Bowel sounds active throughout. There are no masses palpable. No hepatomegaly. Rectal:  defered Neurological: Alert and oriented to person place and time. Skin: Skin is warm and dry. No rashes noted.  Data Reviewed: I have personally reviewed following labs and imaging studies  CBC: CBC Latest Ref Rng & Units 10/27/2020 07/01/2020 07/02/2008  WBC 4.0 - 10.5 K/uL 7.0 5.7 -  Hemoglobin 12.0 - 15.0 g/dL 14.1 12.5 15.6(H)  Hematocrit 36.0 - 46.0 % 43.7 37.6 46.0  Platelets 150 - 400 K/uL 335 398 -    CMP: CMP Latest Ref Rng & Units 10/27/2020 08/29/2019 07/02/2008  Glucose 70 - 99 mg/dL 264(H) 94 102(H)  BUN 8 - 23 mg/dL 20 14 17   Creatinine 0.44 - 1.00 mg/dL 0.92 0.96 0.6  Sodium 135 - 145 mmol/L 141 142 142  Potassium 3.5 - 5.1 mmol/L 3.8 4.4 3.9  Chloride 98 - 111 mmol/L 110 105 110  CO2 22 - 32 mmol/L 20(L) 22 -  Calcium 8.9 - 10.3 mg/dL 9.7 9.6 -  Total Protein 6.0 - 8.5 g/dL - 7.4 -  Total Bilirubin 0.0 - 1.2 mg/dL - 0.2 -  Alkaline Phos 39 - 117 IU/L - 88 -  AST 0 - 40 IU/L - 13 -  ALT 0 - 32 IU/L - 13 -      Carmell Austria, MD 07/21/2021, 3:13 PM  Cc: Leonides Sake, MD

## 2021-07-26 DIAGNOSIS — Z20822 Contact with and (suspected) exposure to covid-19: Secondary | ICD-10-CM | POA: Diagnosis not present

## 2021-08-05 DIAGNOSIS — R634 Abnormal weight loss: Secondary | ICD-10-CM | POA: Diagnosis not present

## 2021-08-05 DIAGNOSIS — J9611 Chronic respiratory failure with hypoxia: Secondary | ICD-10-CM | POA: Diagnosis not present

## 2021-08-05 DIAGNOSIS — J449 Chronic obstructive pulmonary disease, unspecified: Secondary | ICD-10-CM | POA: Diagnosis not present

## 2021-08-08 DIAGNOSIS — I8290 Acute embolism and thrombosis of unspecified vein: Secondary | ICD-10-CM | POA: Diagnosis not present

## 2021-08-08 DIAGNOSIS — C4A9 Merkel cell carcinoma, unspecified: Secondary | ICD-10-CM | POA: Diagnosis not present

## 2021-08-08 DIAGNOSIS — Z79899 Other long term (current) drug therapy: Secondary | ICD-10-CM | POA: Diagnosis not present

## 2021-08-08 DIAGNOSIS — Z20822 Contact with and (suspected) exposure to covid-19: Secondary | ICD-10-CM | POA: Diagnosis not present

## 2021-08-08 DIAGNOSIS — T50905A Adverse effect of unspecified drugs, medicaments and biological substances, initial encounter: Secondary | ICD-10-CM | POA: Diagnosis not present

## 2021-08-08 DIAGNOSIS — C4A8 Merkel cell carcinoma of overlapping sites: Secondary | ICD-10-CM | POA: Diagnosis not present

## 2021-08-08 DIAGNOSIS — R112 Nausea with vomiting, unspecified: Secondary | ICD-10-CM | POA: Diagnosis not present

## 2021-08-08 DIAGNOSIS — I82592 Chronic embolism and thrombosis of other specified deep vein of left lower extremity: Secondary | ICD-10-CM | POA: Diagnosis not present

## 2021-08-08 DIAGNOSIS — M792 Neuralgia and neuritis, unspecified: Secondary | ICD-10-CM | POA: Diagnosis not present

## 2021-08-19 DIAGNOSIS — K5730 Diverticulosis of large intestine without perforation or abscess without bleeding: Secondary | ICD-10-CM | POA: Diagnosis not present

## 2021-08-19 DIAGNOSIS — C4A9 Merkel cell carcinoma, unspecified: Secondary | ICD-10-CM | POA: Diagnosis not present

## 2021-08-25 ENCOUNTER — Telehealth: Payer: Self-pay | Admitting: Gastroenterology

## 2021-08-25 DIAGNOSIS — Z20822 Contact with and (suspected) exposure to covid-19: Secondary | ICD-10-CM | POA: Diagnosis not present

## 2021-08-25 NOTE — Telephone Encounter (Signed)
Inbound call from patient reports she is still experiencing abd pain and swollen. States she told she should wait to have EDG and Colon until she is off BT. But her oncologist wants her to have procedures now. Patient would like a call to discuss treatment with her ?

## 2021-08-26 NOTE — Telephone Encounter (Signed)
Recent CT Abdo/pelvis February 2023 was neg. ?Reviewed previous EGD as well ?Had refused colonoscopy in past ? ?Bailey Nguyen, ?She does have Cologuard at home. ?Has she completed it? ?I do not see report in the chart ? ?Then please make clinic appointment with me (next available). ?She notes that she is also on home oxygen ?If any endoscopic procedures are needed, has to be done at [place] long ? ?RG ?

## 2021-08-26 NOTE — Telephone Encounter (Signed)
Pt states that she has been currently having abdominal pain along with abdominal distention after eating: Pt states that she does take Miralax daily and has BM daily but feels that she is not emptying completely: ?Pt states that she has lost 5 pounds since recent office visit with Dr. Lyndel Safe; ?Pt stated that Her Oncologist Dr. Clance Boll at [facility] is recommending her to have an EGD/ Colonoscopy for the continuous abdominal pain and that they stated that she could hold her Eliquis for 2 days: ?Please advise  ?

## 2021-08-27 NOTE — Telephone Encounter (Signed)
Left message for pt to call back  °

## 2021-08-28 NOTE — Telephone Encounter (Signed)
Left message for pt to call back  °

## 2021-08-29 DIAGNOSIS — C4A9 Merkel cell carcinoma, unspecified: Secondary | ICD-10-CM | POA: Diagnosis not present

## 2021-08-29 DIAGNOSIS — I82502 Chronic embolism and thrombosis of unspecified deep veins of left lower extremity: Secondary | ICD-10-CM | POA: Diagnosis not present

## 2021-08-29 DIAGNOSIS — C4A8 Merkel cell carcinoma of overlapping sites: Secondary | ICD-10-CM | POA: Diagnosis not present

## 2021-08-29 DIAGNOSIS — I871 Compression of vein: Secondary | ICD-10-CM | POA: Diagnosis not present

## 2021-08-29 DIAGNOSIS — R109 Unspecified abdominal pain: Secondary | ICD-10-CM | POA: Diagnosis not present

## 2021-08-29 DIAGNOSIS — I8290 Acute embolism and thrombosis of unspecified vein: Secondary | ICD-10-CM | POA: Diagnosis not present

## 2021-08-29 NOTE — Telephone Encounter (Signed)
Pt was made aware of Dr. Lyndel Safe recommendations: ?Pt stated that she received the Cologuard test about a year ago, never completed it and does not know where it is now: ?Pt was scheduled for an office visit with Dr. Lyndel Safe on 09/25/2021 at 11:20 with Dr. Lyndel Safe. Pt made aware: Address provided: ?Pt verbalized understanding with all questions answered.  ? ?

## 2021-08-29 NOTE — Telephone Encounter (Signed)
Left message for pt to call back  °

## 2021-09-13 DIAGNOSIS — Z20822 Contact with and (suspected) exposure to covid-19: Secondary | ICD-10-CM | POA: Diagnosis not present

## 2021-09-15 DIAGNOSIS — Z20822 Contact with and (suspected) exposure to covid-19: Secondary | ICD-10-CM | POA: Diagnosis not present

## 2021-09-17 DIAGNOSIS — R1030 Lower abdominal pain, unspecified: Secondary | ICD-10-CM | POA: Diagnosis not present

## 2021-09-17 DIAGNOSIS — I2510 Atherosclerotic heart disease of native coronary artery without angina pectoris: Secondary | ICD-10-CM | POA: Diagnosis not present

## 2021-09-17 DIAGNOSIS — J449 Chronic obstructive pulmonary disease, unspecified: Secondary | ICD-10-CM | POA: Diagnosis not present

## 2021-09-17 DIAGNOSIS — R300 Dysuria: Secondary | ICD-10-CM | POA: Diagnosis not present

## 2021-09-17 DIAGNOSIS — Z87891 Personal history of nicotine dependence: Secondary | ICD-10-CM | POA: Diagnosis not present

## 2021-09-17 DIAGNOSIS — Z7901 Long term (current) use of anticoagulants: Secondary | ICD-10-CM | POA: Diagnosis not present

## 2021-09-17 DIAGNOSIS — Z833 Family history of diabetes mellitus: Secondary | ICD-10-CM | POA: Diagnosis not present

## 2021-09-25 ENCOUNTER — Ambulatory Visit: Payer: Medicare Other | Admitting: Gastroenterology

## 2021-10-01 DIAGNOSIS — E1165 Type 2 diabetes mellitus with hyperglycemia: Secondary | ICD-10-CM | POA: Diagnosis not present

## 2021-10-01 DIAGNOSIS — M549 Dorsalgia, unspecified: Secondary | ICD-10-CM | POA: Diagnosis not present

## 2021-10-01 DIAGNOSIS — J9611 Chronic respiratory failure with hypoxia: Secondary | ICD-10-CM | POA: Diagnosis not present

## 2021-10-01 DIAGNOSIS — I700 Atherosclerosis of aorta: Secondary | ICD-10-CM | POA: Diagnosis not present

## 2021-10-01 DIAGNOSIS — Z85821 Personal history of Merkel cell carcinoma: Secondary | ICD-10-CM | POA: Diagnosis not present

## 2021-10-01 DIAGNOSIS — E7800 Pure hypercholesterolemia, unspecified: Secondary | ICD-10-CM | POA: Diagnosis not present

## 2021-10-01 DIAGNOSIS — Z79899 Other long term (current) drug therapy: Secondary | ICD-10-CM | POA: Diagnosis not present

## 2021-10-01 DIAGNOSIS — Z1331 Encounter for screening for depression: Secondary | ICD-10-CM | POA: Diagnosis not present

## 2021-10-01 DIAGNOSIS — Z9981 Dependence on supplemental oxygen: Secondary | ICD-10-CM | POA: Diagnosis not present

## 2021-10-01 DIAGNOSIS — Z9181 History of falling: Secondary | ICD-10-CM | POA: Diagnosis not present

## 2021-10-01 DIAGNOSIS — J449 Chronic obstructive pulmonary disease, unspecified: Secondary | ICD-10-CM | POA: Diagnosis not present

## 2021-10-01 DIAGNOSIS — K219 Gastro-esophageal reflux disease without esophagitis: Secondary | ICD-10-CM | POA: Diagnosis not present

## 2021-10-05 IMAGING — CR DG CHEST 2V
2 series · 2 of 2 positions shown · non-contrast
Comparison: 10/08/2020

CLINICAL DATA: Shortness of breath for several hours

EXAM:
CHEST - 2 VIEW

[chest pa]
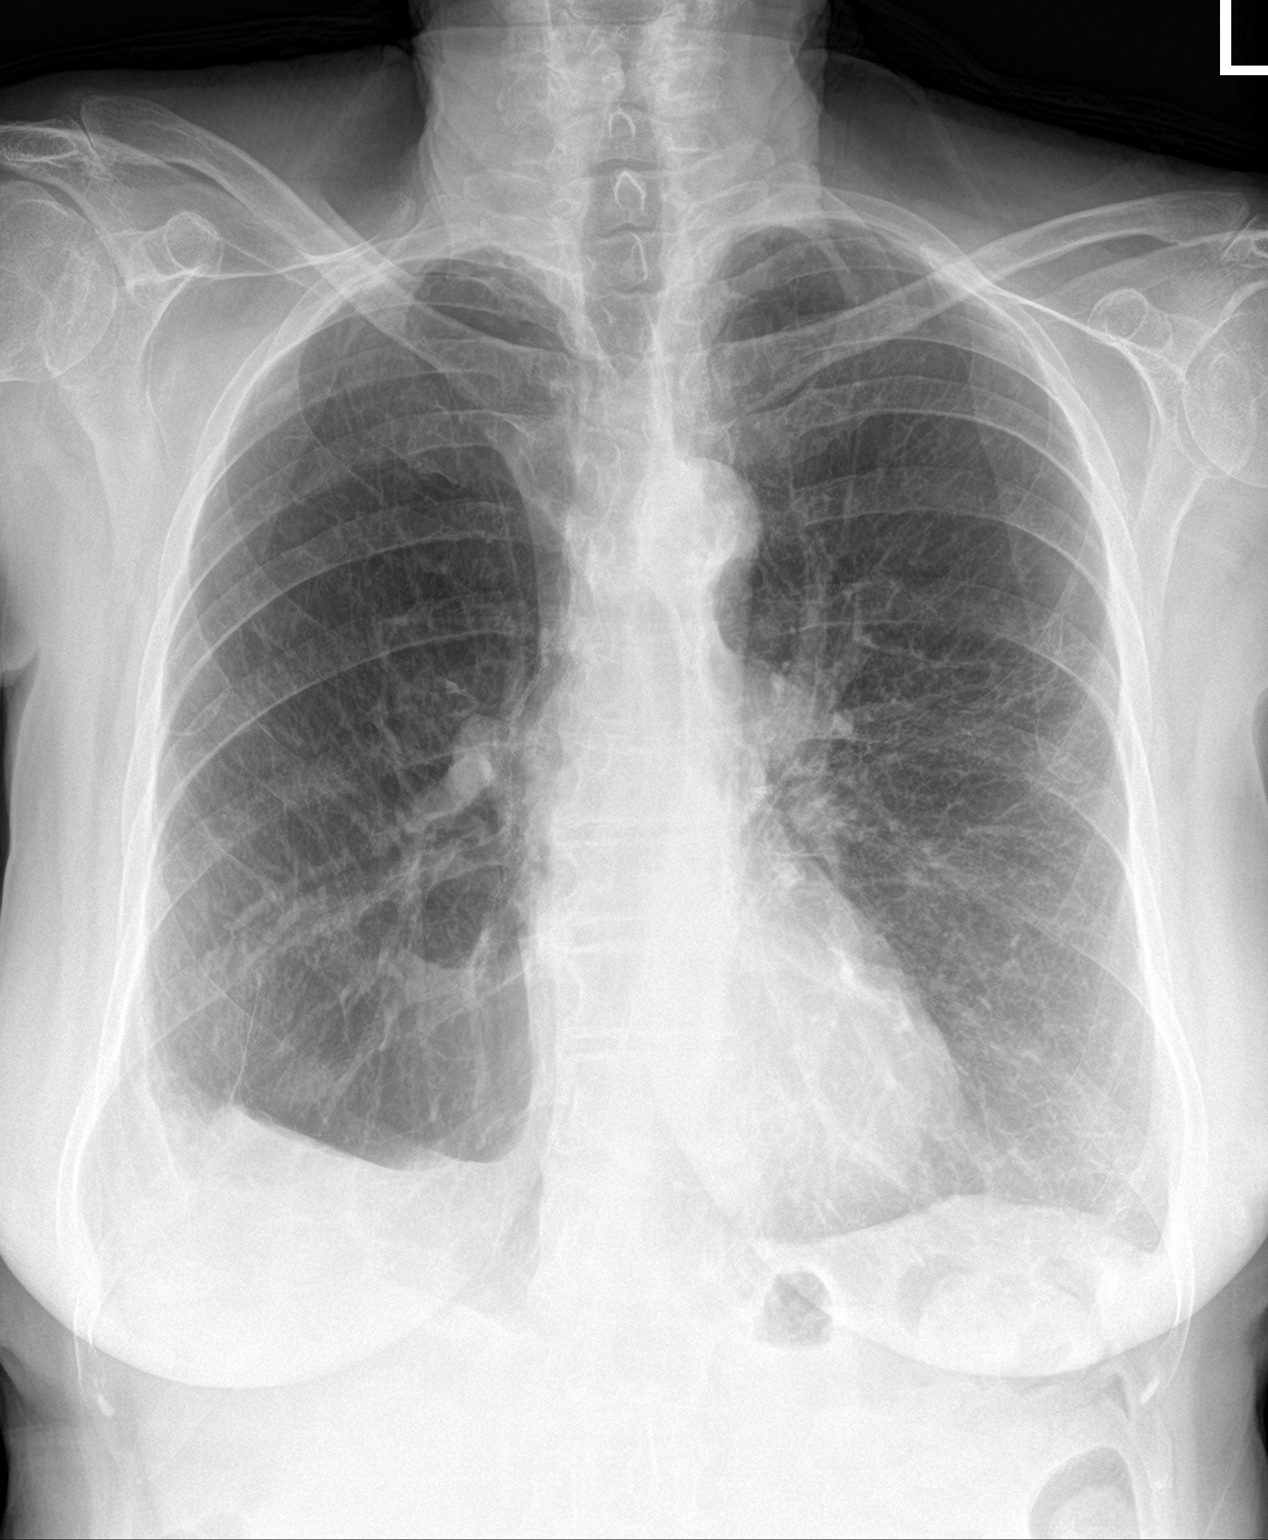

[chest lat]
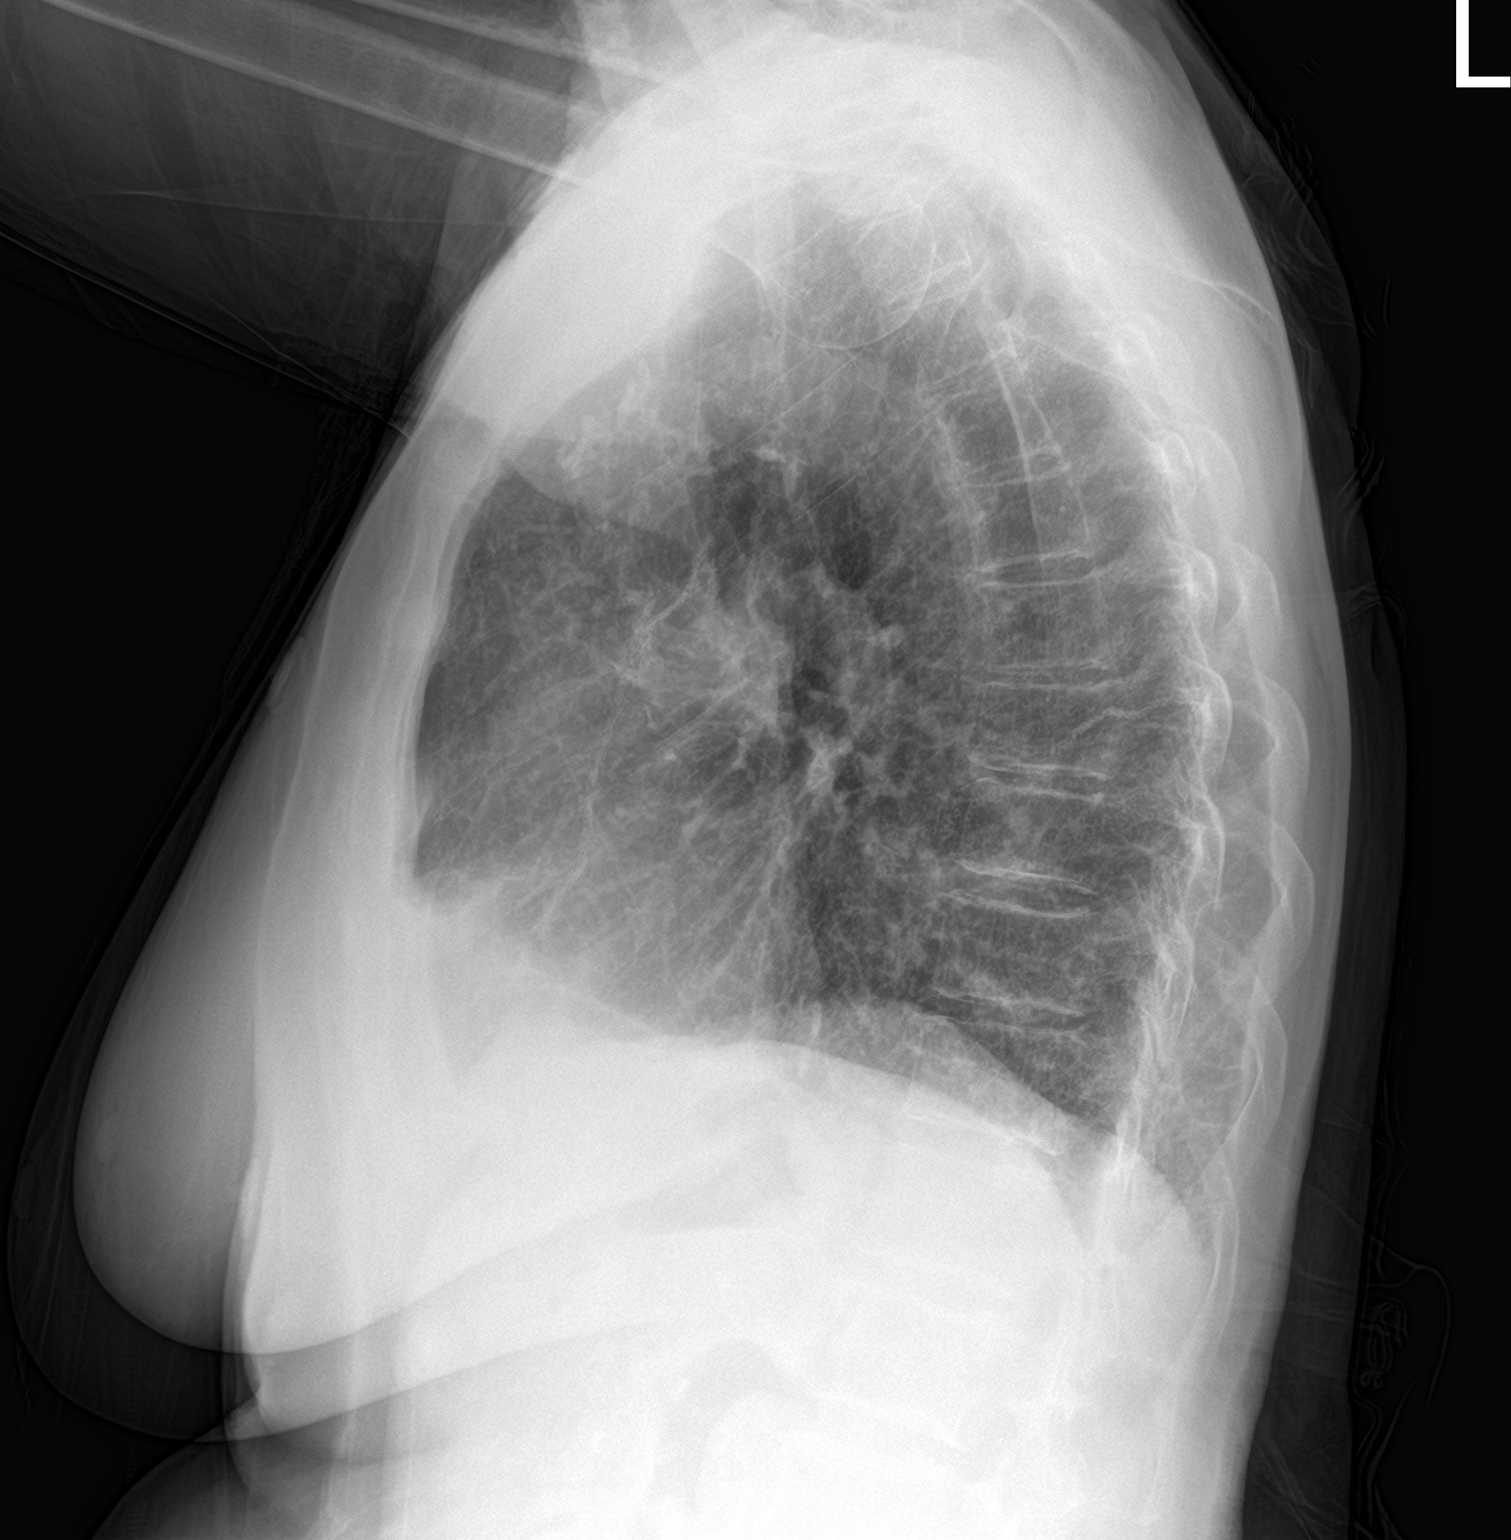

[2 of 2 positions shown; findings below may reference images not displayed]

FINDINGS: Cardiac shadow is stable. Aortic calcifications are noted. Chronic
blunting of the costophrenic angles is seen and stable. No focal
infiltrate or sizable effusion is seen. No bony abnormality is
noted. Postsurgical changes on the right are again seen.
IMPRESSION: Chronic blunting of the costophrenic angles. No acute abnormality
noted.

## 2021-10-28 DIAGNOSIS — R110 Nausea: Secondary | ICD-10-CM | POA: Diagnosis not present

## 2021-10-28 DIAGNOSIS — R109 Unspecified abdominal pain: Secondary | ICD-10-CM | POA: Diagnosis not present

## 2021-10-28 DIAGNOSIS — C4A9 Merkel cell carcinoma, unspecified: Secondary | ICD-10-CM | POA: Diagnosis not present

## 2021-10-28 DIAGNOSIS — M792 Neuralgia and neuritis, unspecified: Secondary | ICD-10-CM | POA: Diagnosis not present

## 2021-11-18 DIAGNOSIS — R29810 Facial weakness: Secondary | ICD-10-CM | POA: Diagnosis not present

## 2021-11-18 DIAGNOSIS — R110 Nausea: Secondary | ICD-10-CM | POA: Diagnosis not present

## 2021-11-18 DIAGNOSIS — G459 Transient cerebral ischemic attack, unspecified: Secondary | ICD-10-CM | POA: Diagnosis not present

## 2021-11-18 DIAGNOSIS — R479 Unspecified speech disturbances: Secondary | ICD-10-CM | POA: Diagnosis not present

## 2021-11-22 DIAGNOSIS — R479 Unspecified speech disturbances: Secondary | ICD-10-CM | POA: Diagnosis not present

## 2021-11-22 DIAGNOSIS — I6782 Cerebral ischemia: Secondary | ICD-10-CM | POA: Diagnosis not present

## 2021-11-22 DIAGNOSIS — I6389 Other cerebral infarction: Secondary | ICD-10-CM | POA: Diagnosis not present

## 2021-11-25 DIAGNOSIS — R110 Nausea: Secondary | ICD-10-CM | POA: Diagnosis not present

## 2021-11-25 DIAGNOSIS — C4A9 Merkel cell carcinoma, unspecified: Secondary | ICD-10-CM | POA: Diagnosis not present

## 2021-11-25 DIAGNOSIS — R109 Unspecified abdominal pain: Secondary | ICD-10-CM | POA: Diagnosis not present

## 2021-12-23 DIAGNOSIS — I6340 Cerebral infarction due to embolism of unspecified cerebral artery: Secondary | ICD-10-CM | POA: Diagnosis not present

## 2021-12-30 ENCOUNTER — Ambulatory Visit (INDEPENDENT_AMBULATORY_CARE_PROVIDER_SITE_OTHER): Payer: Medicare Other | Admitting: Gastroenterology

## 2021-12-30 VITALS — BP 126/68 | HR 90 | Ht 65.0 in | Wt 137.2 lb

## 2021-12-30 DIAGNOSIS — K449 Diaphragmatic hernia without obstruction or gangrene: Secondary | ICD-10-CM

## 2021-12-30 DIAGNOSIS — R1030 Lower abdominal pain, unspecified: Secondary | ICD-10-CM | POA: Diagnosis not present

## 2021-12-30 DIAGNOSIS — K219 Gastro-esophageal reflux disease without esophagitis: Secondary | ICD-10-CM | POA: Diagnosis not present

## 2021-12-30 DIAGNOSIS — R1310 Dysphagia, unspecified: Secondary | ICD-10-CM | POA: Diagnosis not present

## 2021-12-30 DIAGNOSIS — Z85821 Personal history of Merkel cell carcinoma: Secondary | ICD-10-CM | POA: Diagnosis not present

## 2021-12-30 NOTE — Progress Notes (Signed)
Chief Complaint: FU  Referring Provider:  Leonides Sake, MD      ASSESSMENT AND PLAN;   #1. GERD with small HH with occ dysphagia d/t presbyesophagus s/p multiple eso dilatations previously. Last dil 54Fr 07/2020  #2. Lower abdo pain with neg CT AP 06/2021  #3. H/O Merkel cell carcinoma (unknown primary, followed by Dr Clance Boll), H/O Lung AdenoCa s/p RUL lobectomy 01/2015, another lung adeno CA  LLL lobe s/p wedge resection 07/2018.   #4. Associated COPD on home O2, 3V CAD, chronic neck and low back pain, HLD, anxiety.  Plan: -Protonix 28m po QD #90 4RF -EGD with dil/colon at [facility] (on Home O2) -FU therafter   I discussed EGD/Colonoscopy- the indications, risks, alternatives and potential complications including, but not limited to bleeding, infection, reaction to meds, damage to internal organs, cardiac and/or pulmonary problems, and perforation requiring surgery. The possibility that significant findings could be missed was explained. All ? were answered. Pt consents to proceed. HPI:    Bailey Nguyen a 76y.o. female  With Merkel cell ca of unknown primary (Rxed with avelumab), H/O COPD on O2, Ca lung as above, DVT off Eliquis  (Dr MClance Boll, TIA 11/2021, 3V CAD, chronic neck and low back pain, HLD, anxiety.  For follow-up visit.  Advised to get EGD/colonoscopy done by oncology.   [facility] - Largely unchanged postsurgical changes within the left inguinal region status post lymph node dissection in September 2022. Residual ill-defined soft tissue is persistently FDG avid and may reflect inflammatory/postsurgical changes versus malignancy. Continued attention on follow-up.  -Similar multiple subcentimeter moderately FDG avid mediastinal lymph nodes favored to be reactive, continued attention on follow-up.  Has appt with neurology tomorrow for possible TIA on baby ASA. Had MRA head, getting carotid ultrasound tomorrow  Had CT  Abdo/pelvis February 2023 and was found to have L common femoral vein thrombosis- Rxed with eliquis. Rpt [place]. Off eliquis now  Has chronic nausea but no vomiting, heartburn, regurgitation.lately has been having intermittent dysphagia mostly to solids with food getting hung up in the mid chest.  She felt much better after dilation 07/2020. No significant diarrhea or constipation.  No melena or hematochezia.  Lower abdo pain with bloating which gets better with BMs.   Has lost weight as below Wt Readings from Last 3 Encounters:  12/30/21 137 lb 4 oz (62.3 kg)  07/21/21 134 lb 6 oz (61 kg)  10/27/20 147 lb (66.7 kg)       Previous GI work-up:  PET 08/08/2021 - Largely unchanged postsurgical changes within the left inguinal region status post lymph node dissection in September 2022. Residual ill-defined soft tissue is persistently FDG avid and may reflect inflammatory/postsurgical changes versus malignancy. Continued attention on follow-up.  -Similar multiple subcentimeter moderately FDG avid mediastinal lymph nodes favored to be reactive, continued attention on follow-up.  EGD with dil 07/09/2020 -Moderate gastritis. -Presbyesophagus s/p esophageal dilatation 54Fr  CT 06/17/2021 AP with contrast 1.No acute abnormality within the abdomen or pelvis.  2.Partially occlusive thrombus of the left common femoral vein.  There is no central propagation, as the left external and common iliac veins are ligated.  3.Additional postsurgical changes in the left groin are grossly unchanged. No new lymphadenopathy.  4.Extensive dilated pelvic veins with large cross-pelvic collaterals.  [facility] left external iliac lymph nodes concerning for metastatic disease.  Otherwise no intra-abdominal  abnormalities. -Diverticulosis -Small low-attenuation areas in the liver as before.  CT chest 06/07/20 No pulmonary embolism.    Interval development of extensive pulmonary infiltrate compatible  with given history of COVID-19 pneumonia predominantly throughout  the left lung. Associated shotty mediastinal and left hilar  adenopathy is reactive in nature.   Moderate to severe emphysema.   Aortic Atherosclerosis (ICD10-I70.0) and Emphysema (ICD10-J43.9).   -S/P EGDs with dil in 2013, 2016 and 2018, 2020 with good relief  Had colonoscopy 09/2013-limited due to preparation, grossly negative except for moderate sigmoid diverticulosis and internal hemorrhoids. Does not want repeat colonoscopy until now.  CT Chest 04/2019 1. No evidence of local tumor recurrence status post right upper lobectomy and medial left lower lobe wedge resection. 2. No findings of metastatic disease in the chest. 3. Three-vessel coronary atherosclerosis.  PET 06/2018 Abdomen/Pelvis: - Liver: Multiple subcentimeter hepatic hypodensities are unchanged from 2016 activity and exhibit no FDG uptake. Scattered parenchymal calcifications are noted. - Gallbladder: Surgically absent. - Spleen: No splenomegaly. No focal abnormalities. - Pancreas: No focal abnormality - Adrenal glands: Unremarkable - Kidneys: Unremarkable - GI Tract: Colonic diverticulosis. - GU Tract: Unremarkable - Adenopathy: None   ONCOLOGY HISTORY - 05/25/20; [facility] ED for above symptoms. CT abd/pelvis showed a new L inguinal and external iliac lymphadenopathy, the largest of which measures 4.9-cm.  -05/30/20; seen by PCP Dr. Daiva Eves who noticed a mildly tender left inguinal mass midway between pubis symphysis and ASIS and was referred for biopsy  - 07/01/20; Dr. Ruthann Cancer, interventional radiologist performed excisional biopsy of the left inguinal lymph node. Surgical pathology report was c/w poorly differentiated Merkel cell carcinoma ([facility], [phone]).  - 07/25/20; Met with Dr. Andi Devon in surgical oncology at [facility] who  recommended whole body PET/CT scan.  -07/29/20; PET/CT whole body was significant for a new FDG-avid 0.4cm L apex nodule. Stable, scattered, mildly FDG-avid mediastinal lymph nodes. Bulky FDG-avid L external iliac chain 2.2-cm lymph node. Smaller 0.8-cm FDG-avid L external iliac chain lymph node. An up to 3.7-cm FDG-avid L inguinal lymph nodes - 08/15/20; first avelumab infusion  -09/06/20; second avelumab infusion  -09/27/20; third avelumab infusion -12/23/20; rechallenge with a single avelumab infusion (5th infusion). -02/03/21; left femoroinguinal lymph node dissection; pathology c/w metastatic for merkel cell carcinoma 6.5-cm in greatest dimension in 1/9 lymph node ([id number]).  Past Medical History:  Diagnosis Date   Anxiety    Back ache    Cataract    il cataracts removed   Chest wall pain    Chronic constipation    COPD (chronic obstructive pulmonary disease) (HCC)    Coronary artery calcification seen on CT scan 01/01/2016   Diverticula of intestine    [place]   Dyskinesia of esophagus    Emphysema of lung (HCC)    GERD (gastroesophageal reflux disease)    Glaucoma    History of COVID-19 05/2020   Hx of colonic polyps    Hx of Lyme disease    Hx of TIA (transient ischemic attack) and stroke    Hypercholesteremia    Lung cancer ([place])    Removed upper right portion of lung    Neck pain    Osteoporosis    Prediabetes    Splenic infarct    Stroke [facility])    TIA   Tobacco use disorder     Past Surgical History:  Procedure Laterality Date   BACK SURGERY     BIOPSY  07/09/2020   Procedure: BIOPSY;  Surgeon: Jackquline Denmark, MD;  Location: [facility];  Service: Endoscopy;;   CHOLECYSTECTOMY     COLONOSCOPY  09/22/2013   Moderate predominantly sigmoid diverticulosis. Moderate internal hemorrhoids. Otherwise normal colonoscopy. Limited due to qualityt of prep/.   ESOPHAGOGASTRODUODENOSCOPY  03/05/2017   Peptic esophageal stricture. Gastritis.    ESOPHAGOGASTRODUODENOSCOPY (EGD)  WITH PROPOFOL N/A 07/09/2020   Procedure: ESOPHAGOGASTRODUODENOSCOPY (EGD) WITH PROPOFOL;  Surgeon: Jackquline Denmark, MD;  Location: [facility] END[facility];  Service: Endoscopy;  Laterality: N/A;   LUNG REMOVAL, PARTIAL Right 01/2015   LUNG SURGERY Left 07/24/2018   MALONEY DILATION  07/09/2020   Procedure: MALONEY DILATION;  Surgeon: Jackquline Denmark, MD;  Location: [facility] ENDOS[facility];  Service: Endoscopy;;   TUBAL LIGATION      Family History  Problem Relation Age of Onset   Diabetes Sister    Diabetes Brother    Lung cancer Brother    Diabetes Father    Pancreatic cancer Father    Coronary artery disease Mother 50   Thyroid cancer Daughter    Colon cancer Neg Hx    Esophageal cancer Neg Hx    Stomach cancer Neg Hx    Rectal cancer Neg Hx     Social History   Tobacco Use   Smoking status: Former    Packs/day: 1.00    Types: Cigarettes    Quit date: 02/01/2015    Years since quitting: 6.9   Smokeless tobacco: Never  Vaping Use   Vaping Use: Former   Devices: Tried it before for a week  Substance Use Topics   Alcohol use: No    Alcohol/week: 0.0 standard drinks of alcohol   Drug use: No    Current Outpatient Medications  Medication Sig Dispense Refill   albuterol (VENTOLIN HFA) 108 (90 Base) MCG/ACT inhaler Inhale 2 puffs into the lungs every 6 (six) hours as needed for wheezing or shortness of breath.     AMBULATORY NON FORMULARY MEDICATION Medication Name: GI Cocktail equal parts maalox, dicyclomine, and 2% viscous lidocaine Take 55m every 8 hours as needed 150 mL 2   aspirin 325 MG tablet Take 325 mg by mouth daily. Half tablet daily     brinzolamide (AZOPT) 1 % ophthalmic suspension Place 1 drop into the right eye 2 (two) times daily.     Cholecalciferol 25 MCG (1000 UT) tablet Take 1,000 Units by mouth daily.     cycloSPORINE (RESTASIS) 0.05 % ophthalmic emulsion Place 1 drop into both eyes in the morning and at bedtime. 2x day every 12 hours.     EPINEPHrine 0.3 mg/0.3 mL IJ SOAJ  injection Inject 0.3 mg into the muscle as needed for anaphylaxis.     formoterol (PERFOROMIST) 20 MCG/2ML nebulizer solution Inhale into the lungs.     ipratropium-albuterol (DUONEB) 0.5-2.5 (3) MG/3ML SOLN Take 3 mLs by nebulization every 6 (six) hours as needed (asthma).     OXYGEN Inhale 2 L into the lungs at bedtime.     pantoprazole (PROTONIX) 40 MG tablet Take 1 tablet (40 mg total) by mouth daily. 90 tablet 4   polyethylene glycol (MIRALAX / GLYCOLAX) packet Take 17 g by mouth daily as needed for constipation.     rosuvastatin (CRESTOR) 40 MG tablet Take 40 mg by mouth daily.     No current facility-administered medications for this visit.    No Known Allergies  Review of Systems:  Negative except for HPI.     Physical Exam:    BP 126/68   Pulse 90  Ht '5\' 5"'  (1.651 m)   Wt 137 lb 4 oz (62.3 kg)   SpO2 100%   BMI 22.84 kg/m  Filed Weights   12/30/21 1415  Weight: 137 lb 4 oz (62.3 kg)   Constitutional:  Gets dyspneic on minimal exertion.  Cachectic female Psychiatric: Normal mood and affect. Behavior is normal. HEENT: Pupils normal.  Conjunctivae are normal. No scleral icterus.  No thrush. Neck supple.  Cardiovascular: Normal rate, regular rhythm. No edema Pulmonary/chest: Bilateral decreased breath sounds.  No wheezing or rhonchi. Abdominal: Soft, nondistended. Nontender. Bowel sounds active throughout. There are no masses palpable. No hepatomegaly. Left inguinal thickeness. Rectal:  defered Neurological: Alert and oriented to person place and time. Skin: Skin is warm and dry. No rashes noted.  Data Reviewed: I have personally reviewed following labs and imaging studies  CBC:    Latest Ref Rng & Units 10/27/2020   10:18 PM 07/01/2020   11:34 AM 07/02/2008    7:10 AM  CBC  WBC 4.0 - 10.5 K/uL 7.0  5.7    Hemoglobin 12.0 - 15.0 g/dL 14.1  12.5  15.6   Hematocrit 36.0 - 46.0 % 43.7  37.6  46.0   Platelets 150 - 400 K/uL 335  398      CMP:    Latest Ref  Rng & Units 10/27/2020   10:18 PM 08/29/2019    9:47 AM 07/02/2008    7:10 AM  CMP  Glucose 70 - 99 mg/dL 264  94  102   BUN 8 - 23 mg/dL '20  14  17   ' Creatinine 0.44 - 1.00 mg/dL 0.92  0.96  0.6   Sodium 135 - 145 mmol/L 141  142  142   Potassium 3.5 - 5.1 mmol/L 3.8  4.4  3.9   Chloride 98 - 111 mmol/L 110  105  110   CO2 22 - 32 mmol/L 20  22    Calcium 8.9 - 10.3 mg/dL 9.7  9.6    Total Protein 6.0 - 8.5 g/dL  7.4    Total Bilirubin 0.0 - 1.2 mg/dL  0.2    Alkaline Phos 39 - 117 IU/L  88    AST 0 - 40 IU/L  13    ALT 0 - 32 IU/L  13        Bailey Austria, MD 12/30/2021, 2:19 PM  Cc: Leonides Sake, MD

## 2021-12-30 NOTE — Patient Instructions (Addendum)
_______________________________________________________  If you are age 74 or older, your body mass index should be between 23-30. Your Body mass index is 22.84 kg/m. If this is out of the aforementioned range listed, please consider follow up with your Primary Care Provider.  If you are age 61 or younger, your body mass index should be between 19-25. Your Body mass index is 22.84 kg/m. If this is out of the aformentioned range listed, please consider follow up with your Primary Care Provider.   ________________________________________________________  The [facility] providers would like to encourage you to use [facility] to communicate with providers for non-urgent requests or questions.  Due to long hold times on the telephone, sending your provider a message by [facility] may be a faster and more efficient way to get a response.  Please allow 48 business hours for a response.  Please remember that this is for non-urgent requests.  _______________________________________________________  Continue Protonix  You have been scheduled for an endoscopy and colonoscopy. Please follow the written instructions given to you at your visit today. Please pick up your prep supplies at the pharmacy within the next 1-3 days. If you use inhalers (even only as needed), please bring them with you on the day of your procedure.  Thank you,  Dr. Jackquline Denmark

## 2021-12-31 DIAGNOSIS — Z85118 Personal history of other malignant neoplasm of bronchus and lung: Secondary | ICD-10-CM | POA: Diagnosis not present

## 2021-12-31 DIAGNOSIS — M5417 Radiculopathy, lumbosacral region: Secondary | ICD-10-CM | POA: Diagnosis not present

## 2021-12-31 DIAGNOSIS — I639 Cerebral infarction, unspecified: Secondary | ICD-10-CM | POA: Diagnosis not present

## 2021-12-31 DIAGNOSIS — Z79899 Other long term (current) drug therapy: Secondary | ICD-10-CM | POA: Diagnosis not present

## 2021-12-31 DIAGNOSIS — J449 Chronic obstructive pulmonary disease, unspecified: Secondary | ICD-10-CM | POA: Diagnosis not present

## 2021-12-31 DIAGNOSIS — Z792 Long term (current) use of antibiotics: Secondary | ICD-10-CM | POA: Diagnosis not present

## 2021-12-31 DIAGNOSIS — I2510 Atherosclerotic heart disease of native coronary artery without angina pectoris: Secondary | ICD-10-CM | POA: Diagnosis not present

## 2021-12-31 DIAGNOSIS — Z7982 Long term (current) use of aspirin: Secondary | ICD-10-CM | POA: Diagnosis not present

## 2021-12-31 DIAGNOSIS — K219 Gastro-esophageal reflux disease without esophagitis: Secondary | ICD-10-CM | POA: Diagnosis not present

## 2021-12-31 DIAGNOSIS — R110 Nausea: Secondary | ICD-10-CM | POA: Diagnosis not present

## 2021-12-31 DIAGNOSIS — E119 Type 2 diabetes mellitus without complications: Secondary | ICD-10-CM | POA: Diagnosis not present

## 2021-12-31 DIAGNOSIS — I6523 Occlusion and stenosis of bilateral carotid arteries: Secondary | ICD-10-CM | POA: Diagnosis not present

## 2021-12-31 DIAGNOSIS — M5416 Radiculopathy, lumbar region: Secondary | ICD-10-CM | POA: Diagnosis not present

## 2021-12-31 DIAGNOSIS — Z8673 Personal history of transient ischemic attack (TIA), and cerebral infarction without residual deficits: Secondary | ICD-10-CM | POA: Diagnosis not present

## 2021-12-31 DIAGNOSIS — R202 Paresthesia of skin: Secondary | ICD-10-CM | POA: Diagnosis not present

## 2021-12-31 DIAGNOSIS — E785 Hyperlipidemia, unspecified: Secondary | ICD-10-CM | POA: Diagnosis not present

## 2021-12-31 DIAGNOSIS — Z87891 Personal history of nicotine dependence: Secondary | ICD-10-CM | POA: Diagnosis not present

## 2022-01-09 DIAGNOSIS — I639 Cerebral infarction, unspecified: Secondary | ICD-10-CM | POA: Diagnosis not present

## 2022-01-15 DIAGNOSIS — I639 Cerebral infarction, unspecified: Secondary | ICD-10-CM | POA: Diagnosis not present

## 2022-01-16 DIAGNOSIS — R200 Anesthesia of skin: Secondary | ICD-10-CM | POA: Diagnosis not present

## 2022-01-16 DIAGNOSIS — R1032 Left lower quadrant pain: Secondary | ICD-10-CM | POA: Diagnosis not present

## 2022-01-16 DIAGNOSIS — C4A9 Merkel cell carcinoma, unspecified: Secondary | ICD-10-CM | POA: Diagnosis not present

## 2022-01-16 DIAGNOSIS — Z9981 Dependence on supplemental oxygen: Secondary | ICD-10-CM | POA: Diagnosis not present

## 2022-01-16 DIAGNOSIS — C4A72 Merkel cell carcinoma of left lower limb, including hip: Secondary | ICD-10-CM | POA: Diagnosis not present

## 2022-01-16 DIAGNOSIS — J449 Chronic obstructive pulmonary disease, unspecified: Secondary | ICD-10-CM | POA: Diagnosis not present

## 2022-01-16 DIAGNOSIS — C4A8 Merkel cell carcinoma of overlapping sites: Secondary | ICD-10-CM | POA: Diagnosis not present

## 2022-02-06 DIAGNOSIS — Z5329 Procedure and treatment not carried out because of patient's decision for other reasons: Secondary | ICD-10-CM | POA: Diagnosis not present

## 2022-02-06 DIAGNOSIS — R319 Hematuria, unspecified: Secondary | ICD-10-CM | POA: Diagnosis not present

## 2022-02-06 DIAGNOSIS — R1030 Lower abdominal pain, unspecified: Secondary | ICD-10-CM | POA: Diagnosis not present

## 2022-02-06 DIAGNOSIS — Z87891 Personal history of nicotine dependence: Secondary | ICD-10-CM | POA: Diagnosis not present

## 2022-02-06 DIAGNOSIS — Z79899 Other long term (current) drug therapy: Secondary | ICD-10-CM | POA: Diagnosis not present

## 2022-02-06 DIAGNOSIS — K219 Gastro-esophageal reflux disease without esophagitis: Secondary | ICD-10-CM | POA: Diagnosis not present

## 2022-02-06 DIAGNOSIS — J449 Chronic obstructive pulmonary disease, unspecified: Secondary | ICD-10-CM | POA: Diagnosis not present

## 2022-02-06 DIAGNOSIS — Z7982 Long term (current) use of aspirin: Secondary | ICD-10-CM | POA: Diagnosis not present

## 2022-02-06 DIAGNOSIS — R338 Other retention of urine: Secondary | ICD-10-CM | POA: Diagnosis not present

## 2022-02-06 DIAGNOSIS — I2510 Atherosclerotic heart disease of native coronary artery without angina pectoris: Secondary | ICD-10-CM | POA: Diagnosis not present

## 2022-02-06 DIAGNOSIS — K5730 Diverticulosis of large intestine without perforation or abscess without bleeding: Secondary | ICD-10-CM | POA: Diagnosis not present

## 2022-02-06 DIAGNOSIS — R39198 Other difficulties with micturition: Secondary | ICD-10-CM | POA: Diagnosis not present

## 2022-02-06 DIAGNOSIS — Z85118 Personal history of other malignant neoplasm of bronchus and lung: Secondary | ICD-10-CM | POA: Diagnosis not present

## 2022-02-28 DIAGNOSIS — M4807 Spinal stenosis, lumbosacral region: Secondary | ICD-10-CM | POA: Diagnosis not present

## 2022-02-28 DIAGNOSIS — M4726 Other spondylosis with radiculopathy, lumbar region: Secondary | ICD-10-CM | POA: Diagnosis not present

## 2022-02-28 DIAGNOSIS — M5116 Intervertebral disc disorders with radiculopathy, lumbar region: Secondary | ICD-10-CM | POA: Diagnosis not present

## 2022-03-10 ENCOUNTER — Encounter (HOSPITAL_COMMUNITY): Payer: Self-pay | Admitting: Gastroenterology

## 2022-03-10 NOTE — Progress Notes (Signed)
Bailey Nguyen  Bowel Prep reminder: follow prep  For Anesthesia: PCP - [place]  Cardiologist - doesnt see one Pulmonologist- Dr.Tiley [facility]   Chest x-ray -  10/27/20 EKG - 10/28/20 Stress Test -  01/17/16 ECHO - n/a Cardiac Cath -  n/a Pacemaker/ICD device last checked: n/a Sleep Study - n/a CPAP -  n/a   Blood Thinner Instructions: n/a Instructions:  Last Dose:      Anesthesia review: Hx of CAD, stroke, COPD, Emphysema, GERD, Merkel cell carcinoma  RUL lobectomy 01/2015 and another lung adenocarcioma with LLL resection 07/2018. Uses 02 at night sometimes, when uses 2L.Pulmonolgist last seen 08/05/21.

## 2022-03-10 NOTE — Progress Notes (Signed)
Attempted to obtain medical history via telephone, unable to reach at this time. HIPAA compliant voicemail message left requesting return call to pre surgical testing department. 

## 2022-03-16 ENCOUNTER — Encounter (HOSPITAL_COMMUNITY): Payer: Self-pay | Admitting: Gastroenterology

## 2022-03-16 NOTE — Anesthesia Preprocedure Evaluation (Signed)
Anesthesia Evaluation  Patient identified by MRN, date of birth, ID band Patient awake    Reviewed: Allergy & Precautions, NPO status , Patient's Chart, lab work & pertinent test results  History of Anesthesia Complications Negative for: history of anesthetic complications  Airway Mallampati: II  TM Distance: >3 FB Neck ROM: Full    Dental  (+) Edentulous Upper, Edentulous Lower   Pulmonary COPD, former smoker  Lung cancer s/p lobectomy PRN oxygen    Pulmonary exam normal        Cardiovascular + CAD  Normal cardiovascular exam     Neuro/Psych  PSYCHIATRIC DISORDERS Anxiety     TIA   GI/Hepatic Neg liver ROS, hiatal hernia,GERD  Medicated and Controlled,,  Endo/Other   Pre-DM   Renal/GU negative Renal ROS     Musculoskeletal negative musculoskeletal ROS (+)    Abdominal   Peds  Hematology  Splenic infarct    Anesthesia Other Findings   Reproductive/Obstetrics                             Anesthesia Physical Anesthesia Plan  ASA: 3  Anesthesia Plan: MAC   Post-op Pain Management: Minimal or no pain anticipated   Induction:   PONV Risk Score and Plan: 2 and Propofol infusion and Treatment may vary due to age or medical condition  Airway Management Planned: Nasal Cannula and Natural Airway  Additional Equipment: None  Intra-op Plan:   Post-operative Plan:   Informed Consent: I have reviewed the patients History and Physical, chart, labs and discussed the procedure including the risks, benefits and alternatives for the proposed anesthesia with the patient or authorized representative who has indicated his/her understanding and acceptance.       Plan Discussed with: CRNA and Anesthesiologist  Anesthesia Plan Comments:         Anesthesia Quick Evaluation

## 2022-03-17 ENCOUNTER — Ambulatory Visit (HOSPITAL_COMMUNITY)
Admission: RE | Admit: 2022-03-17 | Discharge: 2022-03-17 | Disposition: A | Discharge status: Home / Self Care | Payer: Medicare Other | Source: Ambulatory Visit | Attending: Gastroenterology | Admitting: Gastroenterology

## 2022-03-17 ENCOUNTER — Encounter (HOSPITAL_COMMUNITY): Payer: Self-pay | Admitting: Gastroenterology

## 2022-03-17 ENCOUNTER — Ambulatory Visit (HOSPITAL_COMMUNITY): Payer: Medicare Other | Admitting: Physician Assistant

## 2022-03-17 ENCOUNTER — Encounter (HOSPITAL_COMMUNITY)
Admission: RE | Disposition: A | Discharge status: Home / Self Care | Payer: Self-pay | Source: Ambulatory Visit | Attending: Gastroenterology

## 2022-03-17 ENCOUNTER — Other Ambulatory Visit: Payer: Self-pay

## 2022-03-17 ENCOUNTER — Ambulatory Visit (HOSPITAL_BASED_OUTPATIENT_CLINIC_OR_DEPARTMENT_OTHER): Payer: Medicare Other | Admitting: Physician Assistant

## 2022-03-17 DIAGNOSIS — K5730 Diverticulosis of large intestine without perforation or abscess without bleeding: Secondary | ICD-10-CM | POA: Insufficient documentation

## 2022-03-17 DIAGNOSIS — Z902 Acquired absence of lung [part of]: Secondary | ICD-10-CM | POA: Insufficient documentation

## 2022-03-17 DIAGNOSIS — Q399 Congenital malformation of esophagus, unspecified: Secondary | ICD-10-CM | POA: Diagnosis not present

## 2022-03-17 DIAGNOSIS — K219 Gastro-esophageal reflux disease without esophagitis: Secondary | ICD-10-CM | POA: Diagnosis not present

## 2022-03-17 DIAGNOSIS — R1310 Dysphagia, unspecified: Secondary | ICD-10-CM | POA: Diagnosis not present

## 2022-03-17 DIAGNOSIS — Z9981 Dependence on supplemental oxygen: Secondary | ICD-10-CM | POA: Insufficient documentation

## 2022-03-17 DIAGNOSIS — D123 Benign neoplasm of transverse colon: Secondary | ICD-10-CM

## 2022-03-17 DIAGNOSIS — K319 Disease of stomach and duodenum, unspecified: Secondary | ICD-10-CM | POA: Insufficient documentation

## 2022-03-17 DIAGNOSIS — K449 Diaphragmatic hernia without obstruction or gangrene: Secondary | ICD-10-CM | POA: Diagnosis not present

## 2022-03-17 DIAGNOSIS — Z1211 Encounter for screening for malignant neoplasm of colon: Secondary | ICD-10-CM | POA: Diagnosis not present

## 2022-03-17 DIAGNOSIS — J449 Chronic obstructive pulmonary disease, unspecified: Secondary | ICD-10-CM | POA: Diagnosis not present

## 2022-03-17 DIAGNOSIS — K2289 Other specified disease of esophagus: Secondary | ICD-10-CM | POA: Insufficient documentation

## 2022-03-17 DIAGNOSIS — Z86718 Personal history of other venous thrombosis and embolism: Secondary | ICD-10-CM | POA: Insufficient documentation

## 2022-03-17 DIAGNOSIS — Z8673 Personal history of transient ischemic attack (TIA), and cerebral infarction without residual deficits: Secondary | ICD-10-CM | POA: Insufficient documentation

## 2022-03-17 DIAGNOSIS — F419 Anxiety disorder, unspecified: Secondary | ICD-10-CM | POA: Diagnosis not present

## 2022-03-17 DIAGNOSIS — K259 Gastric ulcer, unspecified as acute or chronic, without hemorrhage or perforation: Secondary | ICD-10-CM | POA: Diagnosis not present

## 2022-03-17 DIAGNOSIS — J439 Emphysema, unspecified: Secondary | ICD-10-CM | POA: Diagnosis not present

## 2022-03-17 DIAGNOSIS — D122 Benign neoplasm of ascending colon: Secondary | ICD-10-CM

## 2022-03-17 DIAGNOSIS — I2510 Atherosclerotic heart disease of native coronary artery without angina pectoris: Secondary | ICD-10-CM | POA: Diagnosis not present

## 2022-03-17 DIAGNOSIS — Z85821 Personal history of Merkel cell carcinoma: Secondary | ICD-10-CM | POA: Diagnosis not present

## 2022-03-17 DIAGNOSIS — Z85118 Personal history of other malignant neoplasm of bronchus and lung: Secondary | ICD-10-CM | POA: Insufficient documentation

## 2022-03-17 DIAGNOSIS — Z87891 Personal history of nicotine dependence: Secondary | ICD-10-CM

## 2022-03-17 DIAGNOSIS — R1030 Lower abdominal pain, unspecified: Secondary | ICD-10-CM

## 2022-03-17 DIAGNOSIS — K641 Second degree hemorrhoids: Secondary | ICD-10-CM | POA: Diagnosis not present

## 2022-03-17 DIAGNOSIS — Z1212 Encounter for screening for malignant neoplasm of rectum: Secondary | ICD-10-CM | POA: Diagnosis not present

## 2022-03-17 HISTORY — PX: POLYPECTOMY: SHX5525

## 2022-03-17 HISTORY — PX: BIOPSY: SHX5522

## 2022-03-17 HISTORY — PX: COLONOSCOPY WITH PROPOFOL: SHX5780

## 2022-03-17 HISTORY — PX: ESOPHAGOGASTRODUODENOSCOPY (EGD) WITH PROPOFOL: SHX5813

## 2022-03-17 HISTORY — PX: MALONEY DILATION: SHX5535

## 2022-03-17 LAB — GLUCOSE, CAPILLARY: Glucose-Capillary: 101 mg/dL — ABNORMAL HIGH (ref 70–99)

## 2022-03-17 SURGERY — COLONOSCOPY WITH PROPOFOL
Anesthesia: Monitor Anesthesia Care

## 2022-03-17 MED ORDER — LIDOCAINE HCL (CARDIAC) PF 100 MG/5ML IV SOSY
PREFILLED_SYRINGE | INTRAVENOUS | Status: DC | PRN
  Administered 2022-03-17: 60 mg via INTRAVENOUS

## 2022-03-17 MED ORDER — PROPOFOL 500 MG/50ML IV EMUL
INTRAVENOUS | Status: DC | PRN
  Administered 2022-03-17: 125 ug/kg/min via INTRAVENOUS

## 2022-03-17 MED ORDER — LACTATED RINGERS IV SOLN
INTRAVENOUS | Status: DC
  Administered 2022-03-17: 1000 mL via INTRAVENOUS

## 2022-03-17 MED ORDER — PROPOFOL 10 MG/ML IV BOLUS
INTRAVENOUS | Status: DC | PRN
  Administered 2022-03-17: 30 mg via INTRAVENOUS

## 2022-03-17 MED ORDER — PROPOFOL 500 MG/50ML IV EMUL
INTRAVENOUS | Status: AC
  Filled 2022-03-17: qty 50

## 2022-03-17 MED ORDER — SODIUM CHLORIDE 0.9 % IV SOLN: INTRAVENOUS | Status: DC

## 2022-03-17 MED ORDER — PROPOFOL 1000 MG/100ML IV EMUL
INTRAVENOUS | Status: AC
  Filled 2022-03-17: qty 200

## 2022-03-17 MED ORDER — PANTOPRAZOLE SODIUM 40 MG PO TBEC: 40.0000 mg | DELAYED_RELEASE_TABLET | Freq: Two times a day (BID) | ORAL | 4 refills | Status: DC

## 2022-03-17 MED ORDER — PROPOFOL 10 MG/ML IV BOLUS
INTRAVENOUS | Status: AC
  Filled 2022-03-17: qty 20

## 2022-03-17 SURGICAL SUPPLY — 25 items

## 2022-03-17 NOTE — Transfer of Care (Signed)
Immediate Anesthesia Transfer of Care Note  Patient: Chalsey Leeth  Procedure(s) Performed: Procedure(s): COLONOSCOPY WITH PROPOFOL (N/A) ESOPHAGOGASTRODUODENOSCOPY (EGD) WITH PROPOFOL (N/A) SAVORY DILATION (N/A) MALONEY DILATION BIOPSY POLYPECTOMY  Patient Location: Endoscopy Unit  Anesthesia Type:MAC  Level of Consciousness:  sedated, patient cooperative and responds to stimulation  Airway & Oxygen Therapy:Patient Spontanous Breathing and Patient connected to face mask oxgen  Post-op Assessment:  Report given to PACU RN and Post -op Vital signs reviewed and stable  Post vital signs:  Reviewed and stable  Last Vitals:  Vitals:   03/17/22 0640  BP: (!) 144/74  Pulse: 90  Resp: 13  Temp: 36.4 C  SpO2: 16%    Complications: No apparent anesthesia complications

## 2022-03-17 NOTE — Op Note (Signed)
[facility] Patient Name: Bailey Nguyen Procedure Date: 03/17/2022 MRN: [id number] Attending MD: Jackquline Denmark , MD, [id number] Date of Birth: 05-06-1946 CSN: [id number] Age: 76 Admit Type: Outpatient Procedure:                Colonoscopy Indications:              Screening for colorectal malignant neoplasm Providers:                Jackquline Denmark, MD, Mikey College, RN, Luan Moore, Technician Referring MD:              Medicines:                Monitored Anesthesia Care Complications:            No immediate complications. Estimated Blood Loss:     Estimated blood loss: none. Procedure:                Pre-Anesthesia Assessment:                           - Prior to the procedure, a History and Physical                            was performed, and patient medications and                            allergies were reviewed. The patient's tolerance of                            previous anesthesia was also reviewed. The risks                            and benefits of the procedure and the sedation                            options and risks were discussed with the patient.                            All questions were answered, and informed consent                            was obtained. Prior Anticoagulants: The patient has                            taken no anticoagulant or antiplatelet agents. ASA                            Grade Assessment: III - A patient with severe                            systemic disease. After reviewing the risks and  benefits, the patient was deemed in satisfactory                            condition to undergo the procedure.                           After obtaining informed consent, the colonoscope                            was passed under direct vision. Throughout the                            procedure, the patient's blood pressure, pulse, and                             oxygen saturations were monitored continuously. The                            PCF-HQ190L ([id number]) Olympus colonoscope was                            introduced through the anus and advanced to the 4                            cm into the ileum. The colonoscopy was performed                            with difficulty d/t quality of preparation. There                            was retained solid stool in several days of the                            colon despite 2-day prep. The solid vegetable                            material did clog the suction channel of the scope                            several times. Aggressive suctioning and aspiration                            was performed. Overall over 80 to 85% of the                            colonic mucosa was visualized satisfactorily. Of                            note the small and flat lesions could have been                            missed. The patient tolerated the procedure well.  The quality of the bowel preparation was adequate                            to identify polyps greater than 5 mm in size. The                            terminal ileum, ileocecal valve, appendiceal                            orifice, and rectum were photographed. Scope In: 7:47:09 AM Scope Out: 8:11:55 AM Scope Withdrawal Time: 0 hours 12 minutes 32 seconds  Total Procedure Duration: 0 hours 24 minutes 46 seconds  Findings:      Three sessile polyps were found in the proximal transverse colon,       proximal ascending colon and mid ascending colon. The polyps were 4 to 6       mm in size. These polyps were removed with a cold snare. Resection and       retrieval were complete.      Multiple medium-mouthed diverticula were found in the sigmoid colon and       few in descending colon.      Non-bleeding internal hemorrhoids were found during retroflexion. The       hemorrhoids were moderate and Grade II (internal  hemorrhoids that       prolapse but reduce spontaneously).      The terminal ileum appeared normal.      The exam was otherwise without abnormality on direct and retroflexion       views. The quality of preparation did limit exam. Impression:               - Three 4 to 6 mm polyps in the proximal transverse                            colon, in the proximal ascending colon and in the                            mid ascending colon, removed with a cold snare.                            Resected and retrieved.                           - Moderate sigmoid diverticulosis.                           - Non-bleeding internal hemorrhoids.                           - The examined portion of the ileum was normal.                           - The examination was otherwise normal on direct                            and retroflexion views. Moderate Sedation:      Not Applicable - Patient  had care per Anesthesia. Recommendation:           - Patient has a contact number available for                            emergencies. The signs and symptoms of potential                            delayed complications were discussed with the                            patient. Return to normal activities tomorrow.                            Written discharge instructions were provided to the                            patient.                           - High fiber diet. Can also start taking Benefiber                            1 tablespoon p.o. every morning with 8 ounces of                            water.                           - Continue present medications.                           - Await pathology results.                           - Repeat colonoscopy for surveillance based on                            pathology results.                           - The findings and recommendations were discussed                            with the patient's family. Procedure Code(s):        --- Professional ---                            [phone], Colonoscopy, flexible; with removal of                            tumor(s), polyp(s), or other lesion(s) by snare                            technique Diagnosis Code(s):        --- Professional ---  Z12.11, Encounter for screening for malignant                            neoplasm of colon                           D12.3, Benign neoplasm of transverse colon (hepatic                            flexure or splenic flexure)                           D12.2, Benign neoplasm of ascending colon                           K64.1, Second degree hemorrhoids                           K57.30, Diverticulosis of large intestine without                            perforation or abscess without bleeding CPT copyright 2022 [employer]. All rights reserved. The codes documented in this report are preliminary and upon coder review may  be revised to meet current compliance requirements. Jackquline Denmark, MD 03/17/2022 8:22:20 AM This report has been signed electronically. Number of Addenda: 0

## 2022-03-17 NOTE — Op Note (Addendum)
[facility] Patient Name: Bailey Nguyen Procedure Date: 03/17/2022 MRN: [id number] Attending MD: Jackquline Denmark , MD, [id number] Date of Birth: 01-25-1946 CSN: [id number] Age: 76 Admit Type: Outpatient Procedure:                Upper GI endoscopy Indications:              Dysphagia. GERD Providers:                Jackquline Denmark, MD, Mikey College, RN, Luan Moore, Technician Referring MD:              Medicines:                Monitored Anesthesia Care Complications:            No immediate complications. Estimated Blood Loss:     Estimated blood loss: none. Procedure:                Pre-Anesthesia Assessment:                           - Prior to the procedure, a History and Physical                            was performed, and patient medications and                            allergies were reviewed. The patient's tolerance of                            previous anesthesia was also reviewed. The risks                            and benefits of the procedure and the sedation                            options and risks were discussed with the patient.                            All questions were answered, and informed consent                            was obtained. Prior Anticoagulants: The patient has                            taken no anticoagulant or antiplatelet agents. ASA                            Grade Assessment: III - A patient with severe                            systemic disease. After reviewing the risks and  benefits, the patient was deemed in satisfactory                            condition to undergo the procedure.                           After obtaining informed consent, the endoscope was                            passed under direct vision. Throughout the                            procedure, the patient's blood pressure, pulse, and                            oxygen saturations were  monitored continuously. The                            [id number]-H190 ([id number]) Olympus endoscope was introduced                            through the mouth, and advanced to the second part                            of duodenum. The upper GI endoscopy was                            accomplished without difficulty. The patient                            tolerated the procedure well. Scope In: Scope Out: Findings:      The lower third of the esophagus was mildly tortuous. The scope was       withdrawn. Dilation was performed with a Maloney dilator with mild       resistance at 35 Fr and 54 Fr.      One non-bleeding linear gastric ulcer with no stigmata of bleeding was       found in the gastric antrum. The lesion was 10 mm in largest dimension.       Biopsies were taken with a cold forceps for histology.      The examined duodenum was normal. Impression:               - Presbyesophagus s/p Dilated.                           - Non-bleeding gastric ulcer with no stigmata of                            bleeding. Biopsied. Moderate Sedation:      Not Applicable - Patient had care per Anesthesia. Recommendation:           - Patient has a contact number available for                            emergencies. The signs and symptoms of potential  delayed complications were discussed with the                            patient. Return to normal activities tomorrow.                            Written discharge instructions were provided to the                            patient.                           - Resume previous diet.                           - Continue present medications. Increased protonix                            40mg  po BID                           - No ibuprofen, naproxen, or other non-steroidal                            anti-inflammatory drugs.                           - Change regular ASA to baby ASA x 8 weeks.                           - Await  pathology results.                           - The findings and recommendations were discussed                            with the patient's family. Procedure Code(s):        --- Professional ---                           [phone], Esophagogastroduodenoscopy, flexible,                            transoral; with biopsy, single or multiple                           43450, Dilation of esophagus, by unguided sound or                            bougie, single or multiple passes Diagnosis Code(s):        --- Professional ---                           Q39.9, Congenital malformation of esophagus,                            unspecified  K25.9, Gastric ulcer, unspecified as acute or                            chronic, without hemorrhage or perforation                           R13.10, Dysphagia, unspecified CPT copyright 2022 American Medical Association. All rights reserved. The codes documented in this report are preliminary and upon coder review may  be revised to meet current compliance requirements. Jackquline Denmark, MD 03/17/2022 8:16:57 AM This report has been signed electronically. Number of Addenda: 0

## 2022-03-17 NOTE — H&P (Signed)
Chief Complaint: FU  Referring Provider:  No ref. provider found      ASSESSMENT AND PLAN;   #1. GERD with small HH with occ dysphagia d/t presbyesophagus s/p multiple eso dilatations previously. Last dil 54Fr 07/2020  #2. Lower abdo pain with neg CT AP 06/2021  #3. H/O Merkel cell carcinoma (unknown primary, followed by Dr Clance Boll), H/O Lung AdenoCa s/p RUL lobectomy 01/2015, another lung adeno CA  LLL lobe s/p wedge resection 07/2018.   #4. Associated COPD on home O2, 3V CAD, chronic neck and low back pain, HLD, anxiety.  Plan: -Protonix 82m po QD #90 4RF -EGD with dil/colon at [facility] (on Home O2) -FU therafter   I discussed EGD/Colonoscopy- the indications, risks, alternatives and potential complications including, but not limited to bleeding, infection, reaction to meds, damage to internal organs, cardiac and/or pulmonary problems, and perforation requiring surgery. The possibility that significant findings could be missed was explained. All ? were answered. Pt consents to proceed. HPI:    Bailey Westrais a 76y.o. female  With Merkel cell ca of unknown primary (Rxed with avelumab), H/O COPD on O2, Ca lung as above, DVT off Eliquis  (Dr MClance Boll, TIA 11/2021, 3V CAD, chronic neck and low back pain, HLD, anxiety.  For follow-up visit.  Advised to get EGD/colonoscopy done by oncology.   [facility] - Largely unchanged postsurgical changes within the left inguinal region status post lymph node dissection in September 2022. Residual ill-defined soft tissue is persistently FDG avid and may reflect inflammatory/postsurgical changes versus malignancy. Continued attention on follow-up.  -Similar multiple subcentimeter moderately FDG avid mediastinal lymph nodes favored to be reactive, continued attention on follow-up.  Has appt with neurology tomorrow for possible TIA on baby ASA. Had MRA head, getting carotid ultrasound tomorrow  Had CT  Abdo/pelvis February 2023 and was found to have L common femoral vein thrombosis- Rxed with eliquis. Rpt [place]. Off eliquis now  Has chronic nausea but no vomiting, heartburn, regurgitation.lately has been having intermittent dysphagia mostly to solids with food getting hung up in the mid chest.  She felt much better after dilation 07/2020. No significant diarrhea or constipation.  No melena or hematochezia.  Lower abdo pain with bloating which gets better with BMs.   Has lost weight as below Wt Readings from Last 3 Encounters:  03/17/22 61.2 kg  12/30/21 62.3 kg  07/21/21 61 kg       Previous GI work-up:  PET 08/08/2021 - Largely unchanged postsurgical changes within the left inguinal region status post lymph node dissection in September 2022. Residual ill-defined soft tissue is persistently FDG avid and may reflect inflammatory/postsurgical changes versus malignancy. Continued attention on follow-up.  -Similar multiple subcentimeter moderately FDG avid mediastinal lymph nodes favored to be reactive, continued attention on follow-up.  EGD with dil 07/09/2020 -Moderate gastritis. -Presbyesophagus s/p esophageal dilatation 54Fr  CT 06/17/2021 AP with contrast 1.No acute abnormality within the abdomen or pelvis.  2.Partially occlusive thrombus of the left common femoral vein.  There is no central propagation, as the left external and common iliac veins are ligated.  3.Additional postsurgical changes in the left groin are grossly unchanged. No new lymphadenopathy.  4.Extensive dilated pelvic veins with large cross-pelvic collaterals.  CT Abdo/pelvis with contrast [facility] and left external iliac lymph nodes concerning for metastatic disease.  Otherwise no intra-abdominal abnormalities. -Diverticulosis -Small low-attenuation areas in the liver as before.  CT chest 06/07/20 No pulmonary embolism.   Interval development of extensive  pulmonary infiltrate compatible  with given history of COVID-19 pneumonia predominantly throughout  the left lung. Associated shotty mediastinal and left hilar  adenopathy is reactive in nature.   Moderate to severe emphysema.   Aortic Atherosclerosis (ICD10-I70.0) and Emphysema (ICD10-J43.9).   -S/P EGDs with dil in 2013, 2016 and 2018, 2020 with good relief  Had colonoscopy 09/2013-limited due to preparation, grossly negative except for moderate sigmoid diverticulosis and internal hemorrhoids. Does not want repeat colonoscopy until now.  CT Chest 04/2019 1. No evidence of local tumor recurrence status post right upper lobectomy and medial left lower lobe wedge resection. 2. No findings of metastatic disease in the chest. 3. Three-vessel coronary atherosclerosis.  PET 06/2018 Abdomen/Pelvis: - Liver: Multiple subcentimeter hepatic hypodensities are unchanged from 2016 activity and exhibit no FDG uptake. Scattered parenchymal calcifications are noted. - Gallbladder: Surgically absent. - Spleen: No splenomegaly. No focal abnormalities. - Pancreas: No focal abnormality - Adrenal glands: Unremarkable - Kidneys: Unremarkable - GI Tract: Colonic diverticulosis. - GU Tract: Unremarkable - Adenopathy: None   ONCOLOGY HISTORY - 05/25/20; [facility] ED for above symptoms. CT abd/pelvis showed a new L inguinal and external iliac lymphadenopathy, the largest of which measures 4.9-cm.  -05/30/20; seen by PCP Dr. Daiva Eves who noticed a mildly tender left inguinal mass midway between pubis symphysis and ASIS and was referred for biopsy  - 07/01/20; Dr. Ruthann Cancer, interventional radiologist performed excisional biopsy of the left inguinal lymph node. Surgical pathology report was c/w poorly differentiated Merkel cell carcinoma ([facility], [id number]).  - 07/25/20; Met with Dr. Andi Devon in surgical oncology at [facility] who recommended whole body PET/CT scan.  -07/29/20;  PET/CT whole body was significant for a new FDG-avid 0.4cm L apex nodule. Stable, scattered, mildly FDG-avid mediastinal lymph nodes. Bulky FDG-avid L external iliac chain 2.2-cm lymph node. Smaller 0.8-cm FDG-avid L external iliac chain lymph node. An up to 3.7-cm FDG-avid L inguinal lymph nodes - 08/15/20; first avelumab infusion  -09/06/20; second avelumab infusion  -09/27/20; third avelumab infusion -12/23/20; rechallenge with a single avelumab infusion (5th infusion). -02/03/21; left femoroinguinal lymph node dissection; pathology c/w metastatic for merkel cell carcinoma 6.5-cm in greatest dimension in 1/9 lymph node ([id number]).  Past Medical History:  Diagnosis Date   Anxiety    Back ache    Cataract    il cataracts removed   Chest wall pain    Chronic constipation    COPD (chronic obstructive pulmonary disease) (HCC)    Coronary artery calcification seen on CT scan 01/01/2016   Diverticula of intestine    [place]   Dyskinesia of esophagus    Emphysema of lung (HCC)    GERD (gastroesophageal reflux disease)    Glaucoma    History of COVID-19 05/2020   Hx of colonic polyps    Hx of Lyme disease    Hx of TIA (transient ischemic attack) and stroke    Hypercholesteremia    Lung cancer ([place])    Removed upper right portion of lung    Neck pain    Osteoporosis    Prediabetes    Splenic infarct    Stroke [facility])    TIA   Tobacco use disorder     Past Surgical History:  Procedure Laterality Date   BACK SURGERY     BIOPSY  07/09/2020   Procedure: BIOPSY;  Surgeon: Jackquline Denmark, MD;  Location: [facility] ENDOSCOPY;  Service: Endoscopy;;  CHOLECYSTECTOMY     COLONOSCOPY  09/22/2013   Moderate predominantly sigmoid diverticulosis. Moderate internal hemorrhoids. Otherwise normal colonoscopy. Limited due to qualityt of prep/.   ESOPHAGOGASTRODUODENOSCOPY  03/05/2017   Peptic esophageal stricture. Gastritis.    ESOPHAGOGASTRODUODENOSCOPY (EGD) WITH PROPOFOL N/A 07/09/2020   Procedure:  ESOPHAGOGASTRODUODENOSCOPY (EGD) WITH PROPOFOL;  Surgeon: Jackquline Denmark, MD;  Location: [facility] ENDOSCOPY;  Service: Endoscopy;  Laterality: N/A;   LUNG REMOVAL, PARTIAL Right 01/2015   LUNG SURGERY Left 07/24/2018   MALONEY DILATION  07/09/2020   Procedure: MALONEY DILATION;  Surgeon: Jackquline Denmark, MD;  Location: [facility] ENDOSCOPY;  Service: Endoscopy;;   TUBAL LIGATION      Family History  Problem Relation Age of Onset   Diabetes Sister    Diabetes Brother    Lung cancer Brother    Diabetes Father    Pancreatic cancer Father    Coronary artery disease Mother 10   Thyroid cancer Daughter    Colon cancer Neg Hx    Esophageal cancer Neg Hx    Stomach cancer Neg Hx    Rectal cancer Neg Hx     Social History   Tobacco Use   Smoking status: Former    Packs/day: 1.00    Types: Cigarettes    Quit date: 02/01/2015    Years since quitting: 7.1   Smokeless tobacco: Never  Vaping Use   Vaping Use: Former   Devices: Tried it before for a week  Substance Use Topics   Alcohol use: No    Alcohol/week: 0.0 standard drinks of alcohol   Drug use: No    Current Facility-Administered Medications  Medication Dose Route Frequency Provider Last Rate Last Admin   0.9 %  sodium chloride infusion   Intravenous Continuous Jackquline Denmark, MD       lactated ringers infusion   Intravenous Continuous Jackquline Denmark, MD 10 mL/hr at 03/17/22 0716 1,000 mL at 03/17/22 0716    No Known Allergies  Review of Systems:  Negative except for HPI.     Physical Exam:    BP (!) 144/74   Pulse 90   Temp 97.6 F (36.4 C) (Tympanic)   Resp 13   Ht _0  (1.651 m)   Wt 61.2 kg   SpO2 98%   BMI 22.45 kg/m  Filed Weights   03/10/22 1552 03/17/22 0640  Weight: 61.2 kg 61.2 kg   Constitutional:  Gets dyspneic on minimal exertion.  Cachectic female Psychiatric: Normal mood and affect. Behavior is normal. HEENT: Pupils normal.  Conjunctivae are normal. No scleral icterus.  No thrush. Neck supple.   Cardiovascular: Normal rate, regular rhythm. No edema Pulmonary/chest: Bilateral decreased breath sounds.  No wheezing or rhonchi. Abdominal: Soft, nondistended. Nontender. Bowel sounds active throughout. There are no masses palpable. No hepatomegaly. Left inguinal thickeness. Rectal:  defered Neurological: Alert and oriented to person place and time. Skin: Skin is warm and dry. No rashes noted.  Data Reviewed: I have personally reviewed following labs and imaging studies  CBC:    Latest Ref Rng & Units 10/27/2020   10:18 PM 07/01/2020   11:34 AM 07/02/2008    7:10 AM  CBC  WBC 4.0 - 10.5 K/uL 7.0  5.7    Hemoglobin 12.0 - 15.0 g/dL 14.1  12.5  15.6   Hematocrit 36.0 - 46.0 % 43.7  37.6  46.0   Platelets 150 - 400 K/uL 335  398      CMP:    Latest Ref Rng & Units 10/27/2020  10:18 PM 08/29/2019    9:47 AM 07/02/2008    7:10 AM  CMP  Glucose 70 - 99 mg/dL 264  94  102   BUN 8 - 23 mg/dL _0 Creatinine 0.44 - 1.00 mg/dL 0.92  0.96  0.6   Sodium 135 - 145 mmol/L 141  142  142   Potassium 3.5 - 5.1 mmol/L 3.8  4.4  3.9   Chloride 98 - 111 mmol/L 110  105  110   CO2 22 - 32 mmol/L 20  22    Calcium 8.9 - 10.3 mg/dL 9.7  9.6    Total Protein 6.0 - 8.5 g/dL  7.4    Total Bilirubin 0.0 - 1.2 mg/dL  0.2    Alkaline Phos 39 - 117 IU/L  88    AST 0 - 40 IU/L  13    ALT 0 - 32 IU/L  13        Carmell Austria, MD 03/17/2022, 7:20 AM  Cc: No ref. provider found

## 2022-03-17 NOTE — Discharge Instructions (Signed)
YOU HAD AN ENDOSCOPIC PROCEDURE TODAY: Refer to the procedure report and other information in the discharge instructions given to you for any specific questions about what was found during the examination. If this information does not answer your questions, please call [place] office at [phone] to clarify.  ° °YOU SHOULD EXPECT: Some feelings of bloating in the abdomen. Passage of more gas than usual. Walking can help get rid of the air that was put into your GI tract during the procedure and reduce the bloating. If you had a lower endoscopy (such as a colonoscopy or flexible sigmoidoscopy) you may notice spotting of blood in your stool or on the toilet paper. Some abdominal soreness may be present for a day or two, also. ° °DIET: Your first meal following the procedure should be a light meal and then it is ok to progress to your normal diet. A half-sandwich or bowl of soup is an example of a good first meal. Heavy or fried foods are harder to digest and may make you feel nauseous or bloated. Drink plenty of fluids but you should avoid alcoholic beverages for 24 hours. If you had a esophageal dilation, please see attached instructions for diet.   ° °ACTIVITY: Your care partner should take you home directly after the procedure. You should plan to take it easy, moving slowly for the rest of the day. You can resume normal activity the day after the procedure however YOU SHOULD NOT DRIVE, use power tools, machinery or perform tasks that involve climbing or major physical exertion for 24 hours (because of the sedation medicines used during the test).  ° °SYMPTOMS TO REPORT IMMEDIATELY: °A gastroenterologist can be reached at any hour. Please call [phone]  for any of the following symptoms:  °Following lower endoscopy (colonoscopy, flexible sigmoidoscopy) °Excessive amounts of blood in the stool  °Significant tenderness, worsening of abdominal pains  °Swelling of the abdomen that is new, acute  °Fever of 100° or  higher  °Following upper endoscopy (EGD, EUS, ERCP, esophageal dilation) °Vomiting of blood or coffee ground material  °New, significant abdominal pain  °New, significant chest pain or pain under the shoulder blades  °Painful or persistently difficult swallowing  °New shortness of breath  °Black, tarry-looking or red, bloody stools ° °FOLLOW UP:  °If any biopsies were taken you will be contacted by phone or by letter within the next 1-3 weeks. Call [phone]  if you have not heard about the biopsies in 3 weeks.  °Please also call with any specific questions about appointments or follow up tests. ° °

## 2022-03-17 NOTE — Anesthesia Postprocedure Evaluation (Signed)
Anesthesia Post Note  Patient: Bailey Nguyen  Procedure(s) Performed: COLONOSCOPY WITH PROPOFOL ESOPHAGOGASTRODUODENOSCOPY (EGD) WITH PROPOFOL SAVORY DILATION [place] POLYPECTOMY     Patient location during evaluation: PACU Anesthesia Type: MAC Level of consciousness: awake and alert Pain management: pain level controlled Vital Signs Assessment: post-procedure vital signs reviewed and stable Respiratory status: spontaneous breathing, nonlabored ventilation and respiratory function stable Cardiovascular status: stable and blood pressure returned to baseline Anesthetic complications: no   No notable events documented.  Last Vitals:  Vitals:   03/17/22 0830 03/17/22 0839  BP: 115/61 (!) 148/65  Pulse: 73 70  Resp: 15 14  Temp:    SpO2: 100% 99%    Last Pain:  Vitals:   03/17/22 0839  TempSrc:   PainSc: 0-No pain                 Audry Pili

## 2022-03-18 ENCOUNTER — Encounter (HOSPITAL_COMMUNITY): Payer: Self-pay | Admitting: Gastroenterology

## 2022-03-18 LAB — SURGICAL PATHOLOGY

## 2022-03-19 ENCOUNTER — Telehealth: Payer: Self-pay | Admitting: Gastroenterology

## 2022-03-19 DIAGNOSIS — I6529 Occlusion and stenosis of unspecified carotid artery: Secondary | ICD-10-CM | POA: Diagnosis not present

## 2022-03-19 DIAGNOSIS — K122 Cellulitis and abscess of mouth: Secondary | ICD-10-CM | POA: Diagnosis not present

## 2022-03-19 DIAGNOSIS — Z85118 Personal history of other malignant neoplasm of bronchus and lung: Secondary | ICD-10-CM | POA: Diagnosis not present

## 2022-03-19 DIAGNOSIS — K115 Sialolithiasis: Secondary | ICD-10-CM | POA: Diagnosis not present

## 2022-03-19 DIAGNOSIS — R1310 Dysphagia, unspecified: Secondary | ICD-10-CM | POA: Diagnosis not present

## 2022-03-19 NOTE — Telephone Encounter (Signed)
Pt states that her throat is very red. Pt had EGD and Colon on 03/17/2022: Pt stated that the night before her procedure pt experienced hives on her arms: Pt stated that as soon as she arrived at home after the procedure that pt started to develop a sore throat: Pt stated that she went to [facility] last night and they notified her that she has an infection of the [place]: Pt was given IV antibiotics in ED and sent home with oral antibiotics:  I notified the pt that I would reach out to [facility] and request her records from last night: ( Left message with [facility] to call back, Direct number provided) Pt notified that she could try Chloraseptic spray to help relive some of her throat pain:

## 2022-03-19 NOTE — Telephone Encounter (Signed)
Patient called states her throat have been burning since her eng procedure. States she was at [facility] yesterday. States need to know if there is something she can do to help. Please call to advise.

## 2022-03-19 NOTE — Telephone Encounter (Signed)
Received a call back from [facility].  Records requested by fax:

## 2022-03-19 NOTE — Telephone Encounter (Signed)
Records received from [place]: Copies made and placed in Dr. Lyndel Safe in box along with copy sent to be scanned into epic:   Final Diagnosis of recent ED visit was Uvulitis:  Discharged Home on Augmentin 875 mg po bid #20 tablets; prednisone 20 mg po daily #20 tablets

## 2022-03-23 ENCOUNTER — Encounter: Payer: Self-pay | Admitting: Gastroenterology

## 2022-03-24 ENCOUNTER — Telehealth: Payer: Self-pay

## 2022-03-24 NOTE — Telephone Encounter (Signed)
Per recent letter that was sent to pt. Recommendations from Dr. Lyndel Safe are to repeat EGD to ensure healing of gastric ulcer: Pt was made aware. A reminder was placed in Epic to repeat EGD in Mid February: Pt made aware:  Pt verbalized understanding with all questions answered.

## 2022-03-26 DIAGNOSIS — Z9981 Dependence on supplemental oxygen: Secondary | ICD-10-CM | POA: Diagnosis not present

## 2022-03-26 DIAGNOSIS — E7800 Pure hypercholesterolemia, unspecified: Secondary | ICD-10-CM | POA: Diagnosis not present

## 2022-03-26 DIAGNOSIS — J9611 Chronic respiratory failure with hypoxia: Secondary | ICD-10-CM | POA: Diagnosis not present

## 2022-03-26 DIAGNOSIS — Z6823 Body mass index (BMI) 23.0-23.9, adult: Secondary | ICD-10-CM | POA: Diagnosis not present

## 2022-03-26 DIAGNOSIS — J449 Chronic obstructive pulmonary disease, unspecified: Secondary | ICD-10-CM | POA: Diagnosis not present

## 2022-03-26 DIAGNOSIS — K219 Gastro-esophageal reflux disease without esophagitis: Secondary | ICD-10-CM | POA: Diagnosis not present

## 2022-03-26 DIAGNOSIS — K1370 Unspecified lesions of oral mucosa: Secondary | ICD-10-CM | POA: Diagnosis not present

## 2022-03-26 DIAGNOSIS — E871 Hypo-osmolality and hyponatremia: Secondary | ICD-10-CM | POA: Diagnosis not present

## 2022-03-26 DIAGNOSIS — E1165 Type 2 diabetes mellitus with hyperglycemia: Secondary | ICD-10-CM | POA: Diagnosis not present

## 2022-04-11 DIAGNOSIS — R202 Paresthesia of skin: Secondary | ICD-10-CM | POA: Diagnosis not present

## 2022-04-17 DIAGNOSIS — M5417 Radiculopathy, lumbosacral region: Secondary | ICD-10-CM | POA: Diagnosis not present

## 2022-04-24 DIAGNOSIS — C4A9 Merkel cell carcinoma, unspecified: Secondary | ICD-10-CM | POA: Diagnosis not present

## 2022-04-24 DIAGNOSIS — F32A Depression, unspecified: Secondary | ICD-10-CM | POA: Diagnosis not present

## 2022-04-24 DIAGNOSIS — C4A72 Merkel cell carcinoma of left lower limb, including hip: Secondary | ICD-10-CM | POA: Diagnosis not present

## 2022-05-12 ENCOUNTER — Telehealth: Payer: Self-pay

## 2022-05-12 NOTE — Telephone Encounter (Signed)
Received reminder in Epic. Pt made aware: Chart reviewed. Pt EGD will have to be done in the hospital due to pt being on home O2. Pt notified that she will be added to Dr. Lyndel Safe hospital list.  Pt verbalized understanding with all questions answered.

## 2022-05-12 NOTE — Telephone Encounter (Signed)
Message Received: Today  Personal reminder Gillermina Hu, RN  Gillermina Hu, RN Personal Reminder sent 03/24/2022  Per recent letter that was sent to pt. Recommendations from Dr. Lyndel Safe are to repeat EGD to ensure healing of gastric ulcer: Pt was made aware. A reminder was placed in Epic to repeat EGD in Mid February: Pt made aware:

## 2022-05-14 DIAGNOSIS — E0840 Diabetes mellitus due to underlying condition with diabetic neuropathy, unspecified: Secondary | ICD-10-CM | POA: Diagnosis not present

## 2022-05-14 DIAGNOSIS — G629 Polyneuropathy, unspecified: Secondary | ICD-10-CM | POA: Diagnosis not present

## 2022-05-14 DIAGNOSIS — M7989 Other specified soft tissue disorders: Secondary | ICD-10-CM | POA: Diagnosis not present

## 2022-05-14 DIAGNOSIS — H539 Unspecified visual disturbance: Secondary | ICD-10-CM | POA: Diagnosis not present

## 2022-05-14 DIAGNOSIS — E0859 Diabetes mellitus due to underlying condition with other circulatory complications: Secondary | ICD-10-CM | POA: Diagnosis not present

## 2022-05-14 DIAGNOSIS — Z8673 Personal history of transient ischemic attack (TIA), and cerebral infarction without residual deficits: Secondary | ICD-10-CM | POA: Diagnosis not present

## 2022-05-15 DIAGNOSIS — R0602 Shortness of breath: Secondary | ICD-10-CM | POA: Diagnosis not present

## 2022-05-15 DIAGNOSIS — M79652 Pain in left thigh: Secondary | ICD-10-CM | POA: Diagnosis not present

## 2022-05-15 DIAGNOSIS — I82492 Acute embolism and thrombosis of other specified deep vein of left lower extremity: Secondary | ICD-10-CM | POA: Diagnosis not present

## 2022-05-15 DIAGNOSIS — I82412 Acute embolism and thrombosis of left femoral vein: Secondary | ICD-10-CM | POA: Diagnosis not present

## 2022-05-15 DIAGNOSIS — M7989 Other specified soft tissue disorders: Secondary | ICD-10-CM | POA: Diagnosis not present

## 2022-05-15 DIAGNOSIS — I2510 Atherosclerotic heart disease of native coronary artery without angina pectoris: Secondary | ICD-10-CM | POA: Diagnosis not present

## 2022-05-15 DIAGNOSIS — Z7901 Long term (current) use of anticoagulants: Secondary | ICD-10-CM | POA: Diagnosis not present

## 2022-05-15 DIAGNOSIS — R110 Nausea: Secondary | ICD-10-CM | POA: Diagnosis not present

## 2022-05-15 DIAGNOSIS — J449 Chronic obstructive pulmonary disease, unspecified: Secondary | ICD-10-CM | POA: Diagnosis not present

## 2022-05-15 DIAGNOSIS — Z87891 Personal history of nicotine dependence: Secondary | ICD-10-CM | POA: Diagnosis not present

## 2022-06-05 ENCOUNTER — Other Ambulatory Visit: Payer: Self-pay

## 2022-06-05 ENCOUNTER — Telehealth: Payer: Self-pay

## 2022-06-05 DIAGNOSIS — K259 Gastric ulcer, unspecified as acute or chronic, without hemorrhage or perforation: Secondary | ICD-10-CM

## 2022-06-05 NOTE — Telephone Encounter (Signed)
Pt was scheduled for an EGD at [facility] on 08/20/2022 at 8:30 AM with Dr. Lyndel Safe: Pt and pt daughter Jackelyn Poling made aware: Case ID [id number]  Pt daughter Jackelyn Poling stated that pt recently started on Eliquis on 05/25/2022  but unsure of how long she will be on it  Pt scheduled for a previsit appt on 07/10/2022 at 3:30 PM. Jackelyn Poling made aware: Location Provided  Pt daughter Jackelyn Poling  verbalized understanding with all questions answered.

## 2022-06-21 DIAGNOSIS — I87009 Postthrombotic syndrome without complications of unspecified extremity: Secondary | ICD-10-CM | POA: Diagnosis not present

## 2022-06-21 DIAGNOSIS — Z87891 Personal history of nicotine dependence: Secondary | ICD-10-CM | POA: Diagnosis not present

## 2022-06-21 DIAGNOSIS — M79605 Pain in left leg: Secondary | ICD-10-CM | POA: Diagnosis not present

## 2022-06-21 DIAGNOSIS — M7989 Other specified soft tissue disorders: Secondary | ICD-10-CM | POA: Diagnosis not present

## 2022-06-21 DIAGNOSIS — Z7982 Long term (current) use of aspirin: Secondary | ICD-10-CM | POA: Diagnosis not present

## 2022-06-21 DIAGNOSIS — E0590 Thyrotoxicosis, unspecified without thyrotoxic crisis or storm: Secondary | ICD-10-CM | POA: Diagnosis not present

## 2022-06-21 DIAGNOSIS — Z7901 Long term (current) use of anticoagulants: Secondary | ICD-10-CM | POA: Diagnosis not present

## 2022-06-21 DIAGNOSIS — I2510 Atherosclerotic heart disease of native coronary artery without angina pectoris: Secondary | ICD-10-CM | POA: Diagnosis not present

## 2022-06-21 DIAGNOSIS — J449 Chronic obstructive pulmonary disease, unspecified: Secondary | ICD-10-CM | POA: Diagnosis not present

## 2022-06-21 DIAGNOSIS — E119 Type 2 diabetes mellitus without complications: Secondary | ICD-10-CM | POA: Diagnosis not present

## 2022-06-22 DIAGNOSIS — R300 Dysuria: Secondary | ICD-10-CM | POA: Diagnosis not present

## 2022-06-24 ENCOUNTER — Telehealth: Payer: Self-pay

## 2022-06-24 ENCOUNTER — Other Ambulatory Visit: Payer: Self-pay

## 2022-06-24 DIAGNOSIS — K219 Gastro-esophageal reflux disease without esophagitis: Secondary | ICD-10-CM

## 2022-06-24 DIAGNOSIS — K259 Gastric ulcer, unspecified as acute or chronic, without hemorrhage or perforation: Secondary | ICD-10-CM

## 2022-06-24 MED ORDER — PANTOPRAZOLE SODIUM 40 MG PO TBEC: 40.0000 mg | DELAYED_RELEASE_TABLET | Freq: Two times a day (BID) | ORAL | 0 refills | Status: DC

## 2022-06-24 NOTE — Telephone Encounter (Signed)
Pt contacted and discussed blood thinner. Pt stated that she does not see a cardiologist and that she is taking the eliquis for the Blood Clot in her leg. Clearance sent to Dr. Tama Gander Moschos to hold Eliquis for 2 days.

## 2022-06-24 NOTE — Telephone Encounter (Signed)
Precharting on this patient noted per tele visit with Remo Lipps, RN pt recently started Eliquis in January and not sure when she will be coming off.   Per protocol if pt in on a blood thinner and has not been seen within a year pt needs to been since in the office with a provider prior to procedure. Dr. Lyndel Safe are you ok with proceeding as is or would you like and OV.   Remo Lipps we will also need cardiac clearance from prescribing physician just in case she will still be taking blood thinner in march. Thank you

## 2022-06-24 NOTE — Telephone Encounter (Signed)
Pt stated that she is almost out of her Protonix. Chart reviewed and noted that pt recently seen Dr. Lyndel Safe at procedure and was recommended to take Protonix 40 mg PO BID. Prescription sent to pharmacy. Pt made aware.  Pt verbalized understanding with all questions answered.

## 2022-06-24 NOTE — Telephone Encounter (Signed)
Routed to Dr. Tama Gander Moschos  Fax number [phone]  Recipients Sent On Sent By Routed Reports   Dr. Tama Gander Moschos   06/24/2022  2:40 PM Gillermina Hu, RN IN BASKET: STANDARD HEADER      Cover Page Message : Please review and advise

## 2022-06-24 NOTE — Telephone Encounter (Signed)
[facility] Pre-operative Risk Assessment     Bailey Nguyen 03-14-1946 [id number]  Procedure: Upper Endoscopy Anesthesia type:  MAC Procedure Date: 08/20/2022 Provider: Dr. Lyndel Safe  Type of Clearance needed: Pharmacy  Medication(s) needing held: Eliquis   Length of time for medication to be held: 2  Please review request and advise by either responding to this message or by sending your response to the fax # provided below.  Thank you,  [facility]  Phone: [phone] Fax: [phone] ATTENTION: Gillermina Hu RN

## 2022-06-25 NOTE — Telephone Encounter (Signed)
Good work.  Thanks RG

## 2022-06-25 NOTE — Telephone Encounter (Signed)
Just making sure its not lost in translation.Per protocol if pt in on a blood thinner and has not been seen within a year pt needs to been since in the office with a provider prior to procedure.   Dr. Garrel Ridgel please advise. Would you like her to come in for an OV prior to her procedure or ok proceeding as is once the clearance is received. Please advise. Thank you.

## 2022-06-25 NOTE — Telephone Encounter (Signed)
Okay to proceed after clearance is obtained As per notes, she is to be scheduled at [facility] (home O2) RG

## 2022-06-26 NOTE — Telephone Encounter (Signed)
Fax received from Dr. Tama Gander Moschos for clearance for pt to hold Eliquis 2 days prior. Copy made and sent to be scanned into Epic. Pt made aware. Pt verbalized understanding with all questions answered.

## 2022-06-29 ENCOUNTER — Encounter: Payer: Medicare Other | Admitting: Gastroenterology

## 2022-07-07 DIAGNOSIS — H401132 Primary open-angle glaucoma, bilateral, moderate stage: Secondary | ICD-10-CM | POA: Diagnosis not present

## 2022-07-07 DIAGNOSIS — H04123 Dry eye syndrome of bilateral lacrimal glands: Secondary | ICD-10-CM | POA: Diagnosis not present

## 2022-07-15 ENCOUNTER — Ambulatory Visit (AMBULATORY_SURGERY_CENTER): Payer: Medicare Other

## 2022-07-15 ENCOUNTER — Other Ambulatory Visit: Payer: Self-pay

## 2022-07-15 VITALS — Ht 65.0 in | Wt 149.0 lb

## 2022-07-15 DIAGNOSIS — K259 Gastric ulcer, unspecified as acute or chronic, without hemorrhage or perforation: Secondary | ICD-10-CM

## 2022-07-15 NOTE — Progress Notes (Signed)
Denies allergies to eggs or soy products. Denies complication of anesthesia or sedation. Denies use of weight loss medication. Denies use of O2.   Emmi instructions given for colonoscopy.  

## 2022-07-31 DIAGNOSIS — H401132 Primary open-angle glaucoma, bilateral, moderate stage: Secondary | ICD-10-CM | POA: Diagnosis not present

## 2022-08-13 ENCOUNTER — Encounter (HOSPITAL_COMMUNITY): Payer: Self-pay | Admitting: Gastroenterology

## 2022-08-13 DIAGNOSIS — G629 Polyneuropathy, unspecified: Secondary | ICD-10-CM | POA: Diagnosis not present

## 2022-08-13 DIAGNOSIS — R768 Other specified abnormal immunological findings in serum: Secondary | ICD-10-CM | POA: Diagnosis not present

## 2022-08-18 ENCOUNTER — Telehealth: Payer: Self-pay | Admitting: Gastroenterology

## 2022-08-18 NOTE — Progress Notes (Signed)
Pt called to cancel procedure for this Thursday at the hospital . Patient stated she was going to call dr. Urban Gibson office to reschedule.

## 2022-08-18 NOTE — Telephone Encounter (Signed)
Patient called to inform that she canceled her endoscopy at [facility] on 08/20/2022.

## 2022-08-18 NOTE — Telephone Encounter (Unsigned)
Pt stated that she canceled her procedure at [facility] due to family visiting her. Pt was notified that she would be put on a wait list and we will contact her with Dr. Chales Abrahams availability.  Pt verbalized understanding with all questions answered.

## 2022-08-19 NOTE — Telephone Encounter (Signed)
Left message for pt to call back: Dr. Chales Abrahams has availability on 12/17/2022 at [facility]  Pt is on Blood Thinner.  Left message for pt to call back

## 2022-08-20 ENCOUNTER — Ambulatory Visit (HOSPITAL_COMMUNITY): Admission: RE | Admit: 2022-08-20 | Payer: Medicare Other | Source: Home / Self Care | Admitting: Gastroenterology

## 2022-08-20 SURGERY — ESOPHAGOGASTRODUODENOSCOPY (EGD) WITH PROPOFOL
Anesthesia: Monitor Anesthesia Care

## 2022-08-31 ENCOUNTER — Telehealth: Payer: Self-pay | Admitting: Gastroenterology

## 2022-08-31 DIAGNOSIS — K259 Gastric ulcer, unspecified as acute or chronic, without hemorrhage or perforation: Secondary | ICD-10-CM

## 2022-08-31 DIAGNOSIS — K449 Diaphragmatic hernia without obstruction or gangrene: Secondary | ICD-10-CM

## 2022-08-31 DIAGNOSIS — K219 Gastro-esophageal reflux disease without esophagitis: Secondary | ICD-10-CM

## 2022-08-31 MED ORDER — AMBULATORY NON FORMULARY MEDICATION: 2 refills | Status: AC

## 2022-08-31 NOTE — Telephone Encounter (Signed)
Good idea Please do RG

## 2022-08-31 NOTE — Telephone Encounter (Signed)
Medications sent to [facility] per patient said they will fill it and told me it didn't need to go to [place]. Appt made for 6-18. She cancelled her EGD because her daughter was coming into town. Told her that she needs to keep this appt and once she has the Gi cocktail she can take and let us know after 2 weeks how she is doing and to call before then if she has trouble. It has been faxed sucessfully

## 2022-08-31 NOTE — Telephone Encounter (Signed)
Can I do the Gi cocktail for this patient?

## 2022-08-31 NOTE — Telephone Encounter (Signed)
Inbound call from patient, would like refill for GI Cocktail. Please advise.

## 2022-08-31 NOTE — Telephone Encounter (Signed)
Inbound call from patient requesting to follow up on EGD, advised patient of voicemail Viviann Spare left her. Stated she would like to speak with him in regards to hiatal hernia.

## 2022-09-04 DIAGNOSIS — J9611 Chronic respiratory failure with hypoxia: Secondary | ICD-10-CM | POA: Diagnosis not present

## 2022-09-04 DIAGNOSIS — R32 Unspecified urinary incontinence: Secondary | ICD-10-CM | POA: Diagnosis not present

## 2022-09-04 DIAGNOSIS — Z9981 Dependence on supplemental oxygen: Secondary | ICD-10-CM | POA: Diagnosis not present

## 2022-09-04 DIAGNOSIS — I890 Lymphedema, not elsewhere classified: Secondary | ICD-10-CM | POA: Diagnosis not present

## 2022-09-04 DIAGNOSIS — C7B1 Secondary Merkel cell carcinoma: Secondary | ICD-10-CM | POA: Diagnosis not present

## 2022-09-04 DIAGNOSIS — C4A9 Merkel cell carcinoma, unspecified: Secondary | ICD-10-CM | POA: Diagnosis not present

## 2022-09-07 ENCOUNTER — Telehealth: Payer: Self-pay | Admitting: Gastroenterology

## 2022-09-07 NOTE — Telephone Encounter (Signed)
Left message for pt to call back  °

## 2022-09-07 NOTE — Telephone Encounter (Signed)
Patient returning phone call from today 04/29. Requesting a call back. Please advise.

## 2022-09-07 NOTE — Telephone Encounter (Signed)
PT is returning call. Requesting call back. 

## 2022-09-08 DIAGNOSIS — H04123 Dry eye syndrome of bilateral lacrimal glands: Secondary | ICD-10-CM | POA: Diagnosis not present

## 2022-09-08 DIAGNOSIS — H401132 Primary open-angle glaucoma, bilateral, moderate stage: Secondary | ICD-10-CM | POA: Diagnosis not present

## 2022-09-08 NOTE — Telephone Encounter (Signed)
Pt was notified that we could reschedule her for her EGD on 12/17/2022 at [facility]. . Pt recently canceled her procedure due to having family in town, pt stated that she is having lower abdominal pain and a burning feeling in her lower abdomen. Pt requesting to be done by one of Dr. Chales Abrahams partner's for a sooner date.  Please advise

## 2022-09-08 NOTE — Telephone Encounter (Signed)
Left message for pt to call back  °

## 2022-09-08 NOTE — Telephone Encounter (Signed)
Spoke with pt. See other phone note. Pt verbalized understanding with all questions answered.

## 2022-09-09 MED ORDER — PANTOPRAZOLE SODIUM 40 MG PO TBEC
40.0000 mg | DELAYED_RELEASE_TABLET | Freq: Two times a day (BID) | ORAL | 1 refills | Status: DC
Start: 2022-09-09 — End: 2022-10-08

## 2022-09-09 NOTE — Telephone Encounter (Signed)
Patent called requesting a refill on Protonix said she only has about one week left and she is taking two a day. To be sent to [facility] pharmacy.

## 2022-09-09 NOTE — Addendum Note (Signed)
Addended by: Alberteen Sam E on: 09/09/2022 09:21 AM   Modules accepted: Orders

## 2022-09-09 NOTE — Telephone Encounter (Signed)
Done

## 2022-09-10 NOTE — Telephone Encounter (Signed)
Not so sure if anybody else has any openings. Can you work her into my schedule somehow RG

## 2022-09-21 ENCOUNTER — Telehealth: Payer: Self-pay

## 2022-09-21 ENCOUNTER — Other Ambulatory Visit: Payer: Self-pay

## 2022-09-21 DIAGNOSIS — R112 Nausea with vomiting, unspecified: Secondary | ICD-10-CM

## 2022-09-21 DIAGNOSIS — K449 Diaphragmatic hernia without obstruction or gangrene: Secondary | ICD-10-CM

## 2022-09-21 DIAGNOSIS — K259 Gastric ulcer, unspecified as acute or chronic, without hemorrhage or perforation: Secondary | ICD-10-CM

## 2022-09-21 DIAGNOSIS — R1310 Dysphagia, unspecified: Secondary | ICD-10-CM

## 2022-09-21 NOTE — Telephone Encounter (Signed)
Pt was made aware that she was scheduled for an EGD with Dr. Chales Abrahams on 10/08/2022 at [facility] at 8:00 AM. Pt to arrive at 6:30 AM. Pt made aware. Prep Instructions were sent to pt via my chart. Pt made aware. Ambulatory referral to GI placed in EPIC.  Pt verbalized understanding with all questions answered.

## 2022-09-21 NOTE — Telephone Encounter (Signed)
Pt was scheduled for an EGD at [facility] with Dr. Chales Abrahams on 10/08/2022 at 8:00 AM. New Case ID number [id number]: Left message to call back.

## 2022-09-24 DIAGNOSIS — B370 Candidal stomatitis: Secondary | ICD-10-CM | POA: Diagnosis not present

## 2022-09-24 DIAGNOSIS — E7800 Pure hypercholesterolemia, unspecified: Secondary | ICD-10-CM | POA: Diagnosis not present

## 2022-09-24 DIAGNOSIS — E1165 Type 2 diabetes mellitus with hyperglycemia: Secondary | ICD-10-CM | POA: Diagnosis not present

## 2022-09-24 DIAGNOSIS — Z139 Encounter for screening, unspecified: Secondary | ICD-10-CM | POA: Diagnosis not present

## 2022-09-24 DIAGNOSIS — K219 Gastro-esophageal reflux disease without esophagitis: Secondary | ICD-10-CM | POA: Diagnosis not present

## 2022-09-24 DIAGNOSIS — J9611 Chronic respiratory failure with hypoxia: Secondary | ICD-10-CM | POA: Diagnosis not present

## 2022-09-24 DIAGNOSIS — I700 Atherosclerosis of aorta: Secondary | ICD-10-CM | POA: Diagnosis not present

## 2022-09-24 DIAGNOSIS — E876 Hypokalemia: Secondary | ICD-10-CM | POA: Diagnosis not present

## 2022-09-24 DIAGNOSIS — Z9981 Dependence on supplemental oxygen: Secondary | ICD-10-CM | POA: Diagnosis not present

## 2022-09-24 DIAGNOSIS — J449 Chronic obstructive pulmonary disease, unspecified: Secondary | ICD-10-CM | POA: Diagnosis not present

## 2022-09-24 DIAGNOSIS — Z6823 Body mass index (BMI) 23.0-23.9, adult: Secondary | ICD-10-CM | POA: Diagnosis not present

## 2022-09-28 DIAGNOSIS — R300 Dysuria: Secondary | ICD-10-CM | POA: Diagnosis not present

## 2022-09-29 ENCOUNTER — Encounter (HOSPITAL_COMMUNITY): Payer: Self-pay | Admitting: Gastroenterology

## 2022-09-29 DIAGNOSIS — C4A72 Merkel cell carcinoma of left lower limb, including hip: Secondary | ICD-10-CM | POA: Diagnosis not present

## 2022-09-29 NOTE — Progress Notes (Signed)
Attempted to obtain medical history via telephone, unable to reach at this time. HIPAA compliant voicemail message left requesting return call to pre surgical testing department. 

## 2022-10-07 NOTE — Anesthesia Preprocedure Evaluation (Signed)
Anesthesia Evaluation  Patient identified by MRN, date of birth, ID band Patient awake    Reviewed: Allergy & Precautions, NPO status , Patient's Chart, lab work & pertinent test results  History of Anesthesia Complications Negative for: history of anesthetic complications  Airway Mallampati: II  TM Distance: >3 FB Neck ROM: Full    Dental  (+) Edentulous Lower, Edentulous Upper   Pulmonary COPD, former smoker  Lung cancer s/p lobectomy PRN oxygen    Pulmonary exam normal breath sounds clear to auscultation       Cardiovascular + CAD  Normal cardiovascular exam Rhythm:Regular Rate:Normal     Neuro/Psych  PSYCHIATRIC DISORDERS Anxiety     TIACVA    GI/Hepatic Neg liver ROS, hiatal hernia,GERD  Medicated and Controlled,,  Endo/Other  diabetes   Pre-DM   Renal/GU negative Renal ROS     Musculoskeletal  (+) Arthritis ,    Abdominal   Peds  Hematology  Splenic infarct    Anesthesia Other Findings   Reproductive/Obstetrics                             Anesthesia Physical Anesthesia Plan  ASA: 3  Anesthesia Plan: MAC   Post-op Pain Management: Minimal or no pain anticipated   Induction:   PONV Risk Score and Plan: 2 and Propofol infusion, Treatment may vary due to age or medical condition and TIVA  Airway Management Planned: Nasal Cannula and Natural Airway  Additional Equipment: None  Intra-op Plan:   Post-operative Plan:   Informed Consent: I have reviewed the patients History and Physical, chart, labs and discussed the procedure including the risks, benefits and alternatives for the proposed anesthesia with the patient or authorized representative who has indicated his/her understanding and acceptance.     Dental advisory given  Plan Discussed with: CRNA  Anesthesia Plan Comments:         Anesthesia Quick Evaluation

## 2022-10-07 NOTE — Progress Notes (Signed)
Attempted to obtain medical history via telephone, unable to reach at this time. HIPAA compliant voicemail message left requesting return call to pre surgical testing department. 

## 2022-10-08 ENCOUNTER — Ambulatory Visit (HOSPITAL_COMMUNITY): Payer: Medicare Other | Admitting: Anesthesiology

## 2022-10-08 ENCOUNTER — Ambulatory Visit (HOSPITAL_COMMUNITY)
Admission: RE | Admit: 2022-10-08 | Discharge: 2022-10-08 | Disposition: A | Discharge status: Home / Self Care | Payer: Medicare Other | Attending: Gastroenterology | Admitting: Gastroenterology

## 2022-10-08 ENCOUNTER — Other Ambulatory Visit: Payer: Self-pay

## 2022-10-08 ENCOUNTER — Encounter (HOSPITAL_COMMUNITY)
Admission: RE | Disposition: A | Discharge status: Home / Self Care | Payer: Self-pay | Source: Home / Self Care | Attending: Gastroenterology

## 2022-10-08 ENCOUNTER — Encounter (HOSPITAL_COMMUNITY): Payer: Self-pay | Admitting: Gastroenterology

## 2022-10-08 ENCOUNTER — Ambulatory Visit (HOSPITAL_BASED_OUTPATIENT_CLINIC_OR_DEPARTMENT_OTHER): Payer: Medicare Other | Admitting: Anesthesiology

## 2022-10-08 DIAGNOSIS — K2289 Other specified disease of esophagus: Secondary | ICD-10-CM | POA: Diagnosis not present

## 2022-10-08 DIAGNOSIS — Z7901 Long term (current) use of anticoagulants: Secondary | ICD-10-CM | POA: Insufficient documentation

## 2022-10-08 DIAGNOSIS — Z85118 Personal history of other malignant neoplasm of bronchus and lung: Secondary | ICD-10-CM | POA: Insufficient documentation

## 2022-10-08 DIAGNOSIS — K2970 Gastritis, unspecified, without bleeding: Secondary | ICD-10-CM

## 2022-10-08 DIAGNOSIS — Z08 Encounter for follow-up examination after completed treatment for malignant neoplasm: Secondary | ICD-10-CM | POA: Insufficient documentation

## 2022-10-08 DIAGNOSIS — J449 Chronic obstructive pulmonary disease, unspecified: Secondary | ICD-10-CM

## 2022-10-08 DIAGNOSIS — Z87891 Personal history of nicotine dependence: Secondary | ICD-10-CM | POA: Diagnosis not present

## 2022-10-08 DIAGNOSIS — K253 Acute gastric ulcer without hemorrhage or perforation: Secondary | ICD-10-CM

## 2022-10-08 DIAGNOSIS — R1310 Dysphagia, unspecified: Secondary | ICD-10-CM

## 2022-10-08 DIAGNOSIS — J439 Emphysema, unspecified: Secondary | ICD-10-CM | POA: Insufficient documentation

## 2022-10-08 DIAGNOSIS — Z79891 Long term (current) use of opiate analgesic: Secondary | ICD-10-CM | POA: Diagnosis not present

## 2022-10-08 DIAGNOSIS — K3189 Other diseases of stomach and duodenum: Secondary | ICD-10-CM | POA: Diagnosis not present

## 2022-10-08 DIAGNOSIS — K259 Gastric ulcer, unspecified as acute or chronic, without hemorrhage or perforation: Secondary | ICD-10-CM | POA: Insufficient documentation

## 2022-10-08 DIAGNOSIS — Z8673 Personal history of transient ischemic attack (TIA), and cerebral infarction without residual deficits: Secondary | ICD-10-CM | POA: Diagnosis not present

## 2022-10-08 DIAGNOSIS — K219 Gastro-esophageal reflux disease without esophagitis: Secondary | ICD-10-CM | POA: Insufficient documentation

## 2022-10-08 DIAGNOSIS — I2510 Atherosclerotic heart disease of native coronary artery without angina pectoris: Secondary | ICD-10-CM | POA: Diagnosis not present

## 2022-10-08 DIAGNOSIS — Z09 Encounter for follow-up examination after completed treatment for conditions other than malignant neoplasm: Secondary | ICD-10-CM | POA: Insufficient documentation

## 2022-10-08 DIAGNOSIS — Q399 Congenital malformation of esophagus, unspecified: Secondary | ICD-10-CM

## 2022-10-08 HISTORY — PX: BIOPSY: SHX5522

## 2022-10-08 HISTORY — PX: MALONEY DILATION: SHX5535

## 2022-10-08 HISTORY — PX: ESOPHAGOGASTRODUODENOSCOPY (EGD) WITH PROPOFOL: SHX5813

## 2022-10-08 LAB — POCT I-STAT, CHEM 8
BUN: 10 mg/dL (ref 8–23)
Calcium, Ion: 1.15 mmol/L (ref 1.15–1.40)
Chloride: 103 mmol/L (ref 98–111)
Creatinine, Ser: 0.6 mg/dL (ref 0.44–1.00)
Glucose, Bld: 120 mg/dL — ABNORMAL HIGH (ref 70–99)
HCT: 43 % (ref 36.0–46.0)
Hemoglobin: 14.6 g/dL (ref 12.0–15.0)
Potassium: 3.9 mmol/L (ref 3.5–5.1)
Sodium: 136 mmol/L (ref 135–145)
TCO2: 23 mmol/L (ref 22–32)

## 2022-10-08 LAB — GLUCOSE, CAPILLARY: Glucose-Capillary: 108 mg/dL — ABNORMAL HIGH (ref 70–99)

## 2022-10-08 SURGERY — ESOPHAGOGASTRODUODENOSCOPY (EGD) WITH PROPOFOL
Anesthesia: Monitor Anesthesia Care

## 2022-10-08 MED ORDER — LIDOCAINE 2% (20 MG/ML) 5 ML SYRINGE
INTRAMUSCULAR | Status: DC | PRN
  Administered 2022-10-08: 40 mg via INTRAVENOUS

## 2022-10-08 MED ORDER — LACTATED RINGERS IV SOLN: INTRAVENOUS | Status: DC

## 2022-10-08 MED ORDER — SODIUM CHLORIDE 0.9 % IV SOLN: INTRAVENOUS | Status: DC

## 2022-10-08 MED ORDER — PROPOFOL 500 MG/50ML IV EMUL
INTRAVENOUS | Status: DC | PRN
  Administered 2022-10-08: 120 ug/kg/min via INTRAVENOUS

## 2022-10-08 MED ORDER — PROPOFOL 10 MG/ML IV BOLUS
INTRAVENOUS | Status: DC | PRN
  Administered 2022-10-08 (×2): 20 mg via INTRAVENOUS
  Administered 2022-10-08: 30 mg via INTRAVENOUS

## 2022-10-08 MED ORDER — PANTOPRAZOLE SODIUM 40 MG PO TBEC
40.0000 mg | DELAYED_RELEASE_TABLET | Freq: Every day | ORAL | 1 refills | Status: DC
Start: 2022-10-08 — End: 2022-12-04

## 2022-10-08 SURGICAL SUPPLY — 15 items

## 2022-10-08 NOTE — Discharge Instructions (Signed)
YOU HAD AN ENDOSCOPIC PROCEDURE TODAY: Refer to the procedure report and other information in the discharge instructions given to you for any specific questions about what was found during the examination. If this information does not answer your questions, please call [place] office at [phone] to clarify.  ° °YOU SHOULD EXPECT: Some feelings of bloating in the abdomen. Passage of more gas than usual. Walking can help get rid of the air that was put into your GI tract during the procedure and reduce the bloating. If you had a lower endoscopy (such as a colonoscopy or flexible sigmoidoscopy) you may notice spotting of blood in your stool or on the toilet paper. Some abdominal soreness may be present for a day or two, also. ° °DIET: Your first meal following the procedure should be a light meal and then it is ok to progress to your normal diet. A half-sandwich or bowl of soup is an example of a good first meal. Heavy or fried foods are harder to digest and may make you feel nauseous or bloated. Drink plenty of fluids but you should avoid alcoholic beverages for 24 hours. If you had a esophageal dilation, please see attached instructions for diet.   ° °ACTIVITY: Your care partner should take you home directly after the procedure. You should plan to take it easy, moving slowly for the rest of the day. You can resume normal activity the day after the procedure however YOU SHOULD NOT DRIVE, use power tools, machinery or perform tasks that involve climbing or major physical exertion for 24 hours (because of the sedation medicines used during the test).  ° °SYMPTOMS TO REPORT IMMEDIATELY: °A gastroenterologist can be reached at any hour. Please call [phone]  for any of the following symptoms:  °Following lower endoscopy (colonoscopy, flexible sigmoidoscopy) °Excessive amounts of blood in the stool  °Significant tenderness, worsening of abdominal pains  °Swelling of the abdomen that is new, acute  °Fever of 100° or  higher  °Following upper endoscopy (EGD, EUS, ERCP, esophageal dilation) °Vomiting of blood or coffee ground material  °New, significant abdominal pain  °New, significant chest pain or pain under the shoulder blades  °Painful or persistently difficult swallowing  °New shortness of breath  °Black, tarry-looking or red, bloody stools ° °FOLLOW UP:  °If any biopsies were taken you will be contacted by phone or by letter within the next 1-3 weeks. Call [phone]  if you have not heard about the biopsies in 3 weeks.  °Please also call with any specific questions about appointments or follow up tests. ° °

## 2022-10-08 NOTE — Transfer of Care (Signed)
Immediate Anesthesia Transfer of Care Note  Patient: Bailey Nguyen  Procedure(s) Performed: ESOPHAGOGASTRODUODENOSCOPY (EGD) WITH PROPOFOL MALONEY DILATION BIOPSY  Patient Location: PACU  Anesthesia Type:MAC  Level of Consciousness: awake, alert , oriented, and patient cooperative  Airway & Oxygen Therapy: Patient Spontanous Breathing and Patient connected to face mask oxygen  Post-op Assessment: Report given to RN and Post -op Vital signs reviewed and stable  Post vital signs: Reviewed and stable  Last Vitals:  Vitals Value Taken Time  BP    Temp    Pulse    Resp    SpO2      Last Pain:  Vitals:   10/08/22 0819  TempSrc: Tympanic  PainSc: Asleep         Complications: No notable events documented.

## 2022-10-08 NOTE — Anesthesia Postprocedure Evaluation (Signed)
Anesthesia Post Note  Patient: Bailey Nguyen  Procedure(s) Performed: ESOPHAGOGASTRODUODENOSCOPY (EGD) WITH PROPOFOL MALONEY DILATION BIOPSY     Patient location during evaluation: PACU Anesthesia Type: MAC Level of consciousness: awake and alert Pain management: pain level controlled Vital Signs Assessment: post-procedure vital signs reviewed and stable Respiratory status: spontaneous breathing Cardiovascular status: stable Anesthetic complications: no   No notable events documented.  Last Vitals:  Vitals:   10/08/22 0829 10/08/22 0839  BP: (!) 115/58 (!) 137/59  Pulse: 73 71  Resp: 15 18  Temp:    SpO2: 97% 98%    Last Pain:  Vitals:   10/08/22 0839  TempSrc:   PainSc: 0-No pain                 Lewie Loron

## 2022-10-08 NOTE — H&P (Signed)
[facility] History and Physical   Primary Care Physician:  Bailey Ravel, MD   Reason for Procedure:   FU GU, dysphagia  Plan:    EGD with dil/Bx     HPI: Bailey Nguyen is a 77 y.o. female    Past Medical History:  Diagnosis Date   Anxiety    Arthritis    Back ache    Cataract    il cataracts removed   Chest wall pain    Chronic constipation    Clotting disorder (HCC)    COPD (chronic obstructive pulmonary disease) (HCC)    Coronary artery calcification seen on CT scan 01/01/2016   Diabetes mellitus without complication (HCC)    Diverticula of intestine    sigmond   Dyskinesia of esophagus    Emphysema of lung (HCC)    GERD (gastroesophageal reflux disease)    Glaucoma    History of COVID-19 05/2020   Hx of colonic polyps    Hx of Lyme disease    Hx of TIA (transient ischemic attack) and stroke    Hypercholesteremia    Lung cancer (HCC)    Removed upper right portion of lung    Neck pain    Neuromuscular disorder (HCC)    Osteoporosis    Oxygen deficiency    Prediabetes    Splenic infarct    Stroke [facility])    TIA   Tobacco use disorder     Past Surgical History:  Procedure Laterality Date   BACK SURGERY     BIOPSY  07/09/2020   Procedure: BIOPSY;  Surgeon: Bailey Bologna, MD;  Location: [facility];  Service: Endoscopy;;   BIOPSY  03/17/2022   Procedure: BIOPSY;  Surgeon: Bailey Bologna, MD;  Location: [facility];  Service: Gastroenterology;;   CHOLECYSTECTOMY     COLONOSCOPY  09/22/2013   Moderate predominantly sigmoid diverticulosis. Moderate internal hemorrhoids. Otherwise normal colonoscopy. Limited due to qualityt of prep/.   COLONOSCOPY WITH PROPOFOL N/A 03/17/2022   Procedure: COLONOSCOPY WITH PROPOFOL;  Surgeon: Bailey Bologna, MD;  Location: [facility];  Service: Gastroenterology;  Laterality: N/A;   ESOPHAGOGASTRODUODENOSCOPY  03/05/2017   Peptic esophageal stricture. Gastritis.    ESOPHAGOGASTRODUODENOSCOPY (EGD) WITH  PROPOFOL N/A 07/09/2020   Procedure: ESOPHAGOGASTRODUODENOSCOPY (EGD) WITH PROPOFOL;  Surgeon: Bailey Bologna, MD;  Location: [facility];  Service: Endoscopy;  Laterality: N/A;   ESOPHAGOGASTRODUODENOSCOPY (EGD) WITH PROPOFOL N/A 03/17/2022   Procedure: ESOPHAGOGASTRODUODENOSCOPY (EGD) WITH PROPOFOL;  Surgeon: Bailey Bologna, MD;  Location: [facility];  Service: Gastroenterology;  Laterality: N/A;   LUNG REMOVAL, PARTIAL Right 01/2015   LUNG SURGERY Left 07/24/2018   MALONEY Nguyen  07/09/2020   Procedure: MALONEY Nguyen;  Surgeon: Bailey Bologna, MD;  Location: [facility];  Service: Endoscopy;;   Bailey Nguyen  03/17/2022   Procedure: Bailey Nguyen;  Surgeon: Bailey Bologna, MD;  Location: [facility];  Service: Gastroenterology;;   POLYPECTOMY  03/17/2022   Procedure: POLYPECTOMY;  Surgeon: Bailey Bologna, MD;  Location: [facility];  Service: Gastroenterology;;   TUBAL LIGATION      Prior to Admission medications   Medication Sig Start Date End Date Taking? Authorizing Provider  albuterol (VENTOLIN HFA) 108 (90 Base) MCG/ACT inhaler Inhale 2 puffs into the lungs every 6 (six) hours as needed for wheezing or shortness of breath.   Yes [provider]  apixaban (ELIQUIS) 5 MG TABS tablet Take 5 mg by mouth 2 (two) times daily.   Yes [provider]  CEQUA 0.09 % SOLN Apply 1 drop  to eye 2 (two) times daily. 09/10/22  Yes [provider]  formoterol (PERFOROMIST) 20 MCG/2ML nebulizer solution Inhale 20 mcg into the lungs 2 (two) times daily as needed (wheezing). 03/14/21  Yes [provider]  nateglinide (STARLIX) 60 MG tablet Take 60 mg by mouth 2 (two) times daily with a meal. Take 1-30 minutes before meals 10/28/21  Yes [provider]  oxyCODONE (OXY IR/ROXICODONE) 5 MG immediate release tablet Take 5 mg by mouth every 4 (four) hours as needed for moderate pain. 02/19/22  Yes [provider]  OXYGEN Inhale 2 L into the lungs at  bedtime.   Yes [provider]  pantoprazole (PROTONIX) 40 MG tablet Take 1 tablet (40 mg total) by mouth 2 (two) times daily before a meal. 09/09/22  Yes Bailey Bologna, MD  polyethylene glycol (MIRALAX / GLYCOLAX) packet Take 17 g by mouth daily as needed for constipation. 02/08/15  Yes [provider]  potassium chloride (KLOR-CON) 10 MEQ tablet Take 10 mEq by mouth daily.   Yes [provider]  pregabalin (LYRICA) 50 MG capsule Take 50 mg by mouth 2 (two) times daily.   Yes [provider]  promethazine (PHENERGAN) 25 MG tablet Take 12.5 mg by mouth every 8 (eight) hours as needed for nausea or vomiting.   Yes [provider]  rosuvastatin (CRESTOR) 40 MG tablet Take 40 mg by mouth daily.   Yes [provider]  AMBULATORY NON FORMULARY MEDICATION Medication Name: GI Cocktail equal parts maalox, dicyclomine, and 2% viscous lidocaine Take 15ml every 8 hours as needed 08/31/22   Bailey Bologna, MD  EPINEPHrine 0.3 mg/0.3 mL IJ SOAJ injection Inject 0.3 mg into the muscle as needed for anaphylaxis. 02/21/20   [provider]    Current Facility-Administered Medications  Medication Dose Route Frequency Provider Last Rate Last Admin   0.9 %  sodium chloride infusion   Intravenous Continuous Bailey Bologna, MD       lactated ringers infusion   Intravenous Continuous Bailey Bologna, MD 10 mL/hr at 10/08/22 0710 New Bag at 10/08/22 0710    Allergies as of 09/21/2022   (No Known Allergies)    Family History  Problem Relation Age of Onset   Diabetes Sister    Diabetes Brother    Lung cancer Brother    Diabetes Father    Pancreatic cancer Father    Coronary artery disease Mother 21   Thyroid cancer Daughter    Colon cancer Neg Hx    Esophageal cancer Neg Hx    Stomach cancer Neg Hx    Rectal cancer Neg Hx     Social History   Socioeconomic History   Marital status: Married    Spouse name: Not on file   Number of children: 5    Years of education: Not on file   Highest education level: Not on file  Occupational History   Not on file  Tobacco Use   Smoking status: Former    Packs/day: 1    Types: Cigarettes    Quit date: 02/01/2015    Years since quitting: 7.6   Smokeless tobacco: Never  Vaping Use   Vaping Use: Former   Devices: Tried it before for a week  Substance and Sexual Activity   Alcohol use: No    Alcohol/week: 0.0 standard drinks of alcohol   Drug use: No   Sexual activity: Not on file  Other Topics Concern   Not on file  Social History Narrative  Not on file   Social Determinants of Health   Financial Resource Strain: Not on file  Food Insecurity: Not on file  Transportation Needs: Not on file  Physical Activity: Not on file  Stress: Not on file  Social Connections: Not on file  Intimate Partner Violence: Not on file    Review of Systems: Positive for none All other review of systems negative except as mentioned in the HPI.  Physical Exam: Vital signs in last 24 hours: Temp:  [97.2 F (36.2 C)] 97.2 F (36.2 C) (05/30 0703) Pulse Rate:  [71] 71 (05/30 0703) Resp:  [15] 15 (05/30 0703) BP: (153)/(73) 153/73 (05/30 0703) SpO2:  [100 %] 100 % (05/30 0703) Weight:  [68 kg] 68 kg (05/30 0703)   General:   Alert,  Well-developed, well-nourished, pleasant and cooperative in NAD Lungs:  Clear throughout to auscultation.   Heart:  Regular rate and rhythm; no murmurs, clicks, rubs,  or gallops. Abdomen:  Soft, nontender and nondistended. Normal bowel sounds.   Neuro/Psych:  Alert and cooperative. Normal mood and affect. A and O x 3    No significant changes were identified.  The patient continues to be an appropriate candidate for the planned procedure and anesthesia.   Edman Circle, MD. [facility] 10/08/2022 7:58 AM@

## 2022-10-08 NOTE — Op Note (Signed)
[facility] Patient Name: Bailey Nguyen Procedure Date: 10/08/2022 MRN: [id number] Attending MD: Lynann Bologna , MD, [id number] Date of Birth: 11/08/1945 CSN: [id number] Age: 77 Admit Type: Outpatient Procedure:                Upper GI endoscopy Indications:              Dysphagia. FU Gastric ulcer Providers:                Lynann Bologna, MD, Margaree Mackintosh, RN,                            Kandice Robinsons, Technician Referring MD:              Medicines:                Monitored Anesthesia Care Complications:            No immediate complications. Estimated Blood Loss:     Estimated blood loss: none. Procedure:                Pre-Anesthesia Assessment:                           - Prior to the procedure, a History and Physical                            was performed, and patient medications and                            allergies were reviewed. The patient's tolerance of                            previous anesthesia was also reviewed. The risks                            and benefits of the procedure and the sedation                            options and risks were discussed with the patient.                            All questions were answered, and informed consent                            was obtained. Prior Anticoagulants: Eliquis was                            held 3 days prior. ASA Grade Assessment: III - A                            patient with severe systemic disease. After                            reviewing the risks and benefits, the patient was  deemed in satisfactory condition to undergo the                            procedure.                           After obtaining informed consent, the endoscope was                            passed under direct vision. Throughout the                            procedure, the patient's blood pressure, pulse, and                            oxygen saturations were monitored  continuously. The                            GIF-H190 ([id number]) Olympus endoscope was introduced                            through the mouth, and advanced to the second part                            of duodenum. The upper GI endoscopy was                            accomplished without difficulty. The patient                            tolerated the procedure well. Scope In: Scope Out: Findings:      The lower third of the esophagus was moderately tortuous with       well-defined Z-line at 35 cm, examined by NBI. Biopsies were obtained       from the proximal and distal esophagus with cold forceps for histology       of suspected eosinophilic esophagitis. The scope was withdrawn. Dilation       was performed with a Maloney dilator with mild resistance at 52 Fr and       54 Fr.      The antral ulcer had completely healed. Localized mild inflammation       characterized by erythema was found in the gastric antrum. Biopsies were       taken with a cold forceps for histology.      The examined duodenum was normal. Impression:               - Tortuous esophagus s/o Presbyesophagus. Dilated.                           - Gastritis. Biopsied. Complete healing of gastric                            ulcer. Moderate Sedation:      Not Applicable - Patient had care per Anesthesia. Recommendation:           - Patient has a contact number available  for                            emergencies. The signs and symptoms of potential                            delayed complications were discussed with the                            patient. Return to normal activities tomorrow.                            Written discharge instructions were provided to the                            patient.                           - Resume previous diet.                           - Reduce Protonix to 40 mg p.o. daily                           - Continue present medications.                           - Resume Eliquis  (apixaban) at prior dose tomorrow.                           - Await pathology results.                           - I have instructed patient that she needs to chew                            her foods especially meats and breads well and eat                            slowly.                           - Follow-up as needed.                           - The findings and recommendations were discussed                            with the patient's family. Procedure Code(s):        --- Professional ---                           [phone], Esophagogastroduodenoscopy, flexible,                            transoral; with biopsy, single or multiple  43450, Dilation of esophagus, by unguided sound or                            bougie, single or multiple passes Diagnosis Code(s):        --- Professional ---                           Q39.9, Congenital malformation of esophagus,                            unspecified                           K29.70, Gastritis, unspecified, without bleeding                           R13.10, Dysphagia, unspecified CPT copyright 2022 American Medical Association. All rights reserved. The codes documented in this report are preliminary and upon coder review may  be revised to meet current compliance requirements. Lynann Bologna, MD 10/08/2022 8:22:13 AM This report has been signed electronically. Number of Addenda: 0

## 2022-10-09 LAB — SURGICAL PATHOLOGY

## 2022-10-11 ENCOUNTER — Encounter (HOSPITAL_COMMUNITY): Payer: Self-pay | Admitting: Gastroenterology

## 2022-10-14 ENCOUNTER — Encounter: Payer: Self-pay | Admitting: Gastroenterology

## 2022-10-14 ENCOUNTER — Other Ambulatory Visit: Payer: Self-pay

## 2022-10-14 DIAGNOSIS — K200 Eosinophilic esophagitis: Secondary | ICD-10-CM

## 2022-10-14 MED ORDER — FLUTICASONE PROPIONATE HFA 110 MCG/ACT IN AERO
1.0000 | INHALATION_SPRAY | Freq: Two times a day (BID) | RESPIRATORY_TRACT | 2 refills | Status: DC
Start: 2022-10-14 — End: 2023-08-02

## 2022-10-14 NOTE — Progress Notes (Signed)
Please inform the patient. Pt with increased eosinophils on biopsies despite PPIs suggestive of mild eosinophilic esophagitis  Plan: Start Flovent inhalers (122mcg/inh) twice daily for 1 month, then daily for 2 months or Advair 250/50 inhalations twice daily for 1 month and then once a day for 1 month or any steroid inhaler covered by insurance.  Must swallow.  Do not eat or drink or rinse for 20-30 minutes thereafter.  Then can rinse with warm water.  Food allergy testing from Dr. Kathyrn Lass or any Allergist's office.  Continue Protonix 40mg  po QD

## 2022-10-15 DIAGNOSIS — I890 Lymphedema, not elsewhere classified: Secondary | ICD-10-CM | POA: Diagnosis not present

## 2022-10-22 DIAGNOSIS — I890 Lymphedema, not elsewhere classified: Secondary | ICD-10-CM | POA: Diagnosis not present

## 2022-10-23 ENCOUNTER — Telehealth: Payer: Self-pay | Admitting: Gastroenterology

## 2022-10-23 NOTE — Telephone Encounter (Signed)
Inbound call from patient stating she wanted to cancel her appointment with Dr. Chales Abrahams on 6/18. She also wanted to know if the inhaler that Dr. Chales Abrahams wanted her to use had been ordered. Please advise.

## 2022-10-23 NOTE — Telephone Encounter (Signed)
Patient is advised that her flovent prescription was sent to [facility] on 10/14/22. She verbalizes understanding.

## 2022-10-27 ENCOUNTER — Ambulatory Visit: Payer: Medicare Other | Admitting: Gastroenterology

## 2022-10-29 DIAGNOSIS — I890 Lymphedema, not elsewhere classified: Secondary | ICD-10-CM | POA: Diagnosis not present

## 2022-11-05 DIAGNOSIS — I890 Lymphedema, not elsewhere classified: Secondary | ICD-10-CM | POA: Diagnosis not present

## 2022-11-26 DIAGNOSIS — Z79899 Other long term (current) drug therapy: Secondary | ICD-10-CM | POA: Diagnosis not present

## 2022-11-26 DIAGNOSIS — E119 Type 2 diabetes mellitus without complications: Secondary | ICD-10-CM | POA: Diagnosis not present

## 2022-11-26 DIAGNOSIS — R310 Gross hematuria: Secondary | ICD-10-CM | POA: Diagnosis not present

## 2022-11-26 DIAGNOSIS — I2510 Atherosclerotic heart disease of native coronary artery without angina pectoris: Secondary | ICD-10-CM | POA: Diagnosis not present

## 2022-11-26 DIAGNOSIS — J449 Chronic obstructive pulmonary disease, unspecified: Secondary | ICD-10-CM | POA: Diagnosis not present

## 2022-11-26 DIAGNOSIS — N398 Other specified disorders of urinary system: Secondary | ICD-10-CM | POA: Diagnosis not present

## 2022-11-26 DIAGNOSIS — Z8673 Personal history of transient ischemic attack (TIA), and cerebral infarction without residual deficits: Secondary | ICD-10-CM | POA: Diagnosis not present

## 2022-11-26 DIAGNOSIS — Z7901 Long term (current) use of anticoagulants: Secondary | ICD-10-CM | POA: Diagnosis not present

## 2022-11-28 DIAGNOSIS — E119 Type 2 diabetes mellitus without complications: Secondary | ICD-10-CM | POA: Diagnosis not present

## 2022-11-28 DIAGNOSIS — Z7951 Long term (current) use of inhaled steroids: Secondary | ICD-10-CM | POA: Diagnosis not present

## 2022-11-28 DIAGNOSIS — J449 Chronic obstructive pulmonary disease, unspecified: Secondary | ICD-10-CM | POA: Diagnosis not present

## 2022-11-28 DIAGNOSIS — K219 Gastro-esophageal reflux disease without esophagitis: Secondary | ICD-10-CM | POA: Diagnosis not present

## 2022-11-28 DIAGNOSIS — N390 Urinary tract infection, site not specified: Secondary | ICD-10-CM | POA: Diagnosis not present

## 2022-11-28 DIAGNOSIS — I2510 Atherosclerotic heart disease of native coronary artery without angina pectoris: Secondary | ICD-10-CM | POA: Diagnosis not present

## 2022-11-28 DIAGNOSIS — R319 Hematuria, unspecified: Secondary | ICD-10-CM | POA: Diagnosis not present

## 2022-11-28 DIAGNOSIS — R339 Retention of urine, unspecified: Secondary | ICD-10-CM | POA: Diagnosis not present

## 2022-11-28 DIAGNOSIS — Z8673 Personal history of transient ischemic attack (TIA), and cerebral infarction without residual deficits: Secondary | ICD-10-CM | POA: Diagnosis not present

## 2022-11-28 DIAGNOSIS — Z7901 Long term (current) use of anticoagulants: Secondary | ICD-10-CM | POA: Diagnosis not present

## 2022-11-28 DIAGNOSIS — Z79899 Other long term (current) drug therapy: Secondary | ICD-10-CM | POA: Diagnosis not present

## 2022-11-28 DIAGNOSIS — Z87891 Personal history of nicotine dependence: Secondary | ICD-10-CM | POA: Diagnosis not present

## 2022-11-29 DIAGNOSIS — Z85118 Personal history of other malignant neoplasm of bronchus and lung: Secondary | ICD-10-CM | POA: Diagnosis not present

## 2022-11-29 DIAGNOSIS — Z8673 Personal history of transient ischemic attack (TIA), and cerebral infarction without residual deficits: Secondary | ICD-10-CM | POA: Diagnosis not present

## 2022-11-29 DIAGNOSIS — R319 Hematuria, unspecified: Secondary | ICD-10-CM | POA: Diagnosis not present

## 2022-11-29 DIAGNOSIS — Z7901 Long term (current) use of anticoagulants: Secondary | ICD-10-CM | POA: Diagnosis not present

## 2022-11-29 DIAGNOSIS — Z7951 Long term (current) use of inhaled steroids: Secondary | ICD-10-CM | POA: Diagnosis not present

## 2022-11-29 DIAGNOSIS — N390 Urinary tract infection, site not specified: Secondary | ICD-10-CM | POA: Diagnosis not present

## 2022-11-29 DIAGNOSIS — K219 Gastro-esophageal reflux disease without esophagitis: Secondary | ICD-10-CM | POA: Diagnosis not present

## 2022-11-29 DIAGNOSIS — Z87891 Personal history of nicotine dependence: Secondary | ICD-10-CM | POA: Diagnosis not present

## 2022-11-29 DIAGNOSIS — E0590 Thyrotoxicosis, unspecified without thyrotoxic crisis or storm: Secondary | ICD-10-CM | POA: Diagnosis not present

## 2022-11-29 DIAGNOSIS — I2510 Atherosclerotic heart disease of native coronary artery without angina pectoris: Secondary | ICD-10-CM | POA: Diagnosis not present

## 2022-11-29 DIAGNOSIS — Z79899 Other long term (current) drug therapy: Secondary | ICD-10-CM | POA: Diagnosis not present

## 2022-11-29 DIAGNOSIS — J449 Chronic obstructive pulmonary disease, unspecified: Secondary | ICD-10-CM | POA: Diagnosis not present

## 2022-11-29 DIAGNOSIS — E119 Type 2 diabetes mellitus without complications: Secondary | ICD-10-CM | POA: Diagnosis not present

## 2022-11-29 DIAGNOSIS — Z792 Long term (current) use of antibiotics: Secondary | ICD-10-CM | POA: Diagnosis not present

## 2022-11-29 DIAGNOSIS — Z466 Encounter for fitting and adjustment of urinary device: Secondary | ICD-10-CM | POA: Diagnosis not present

## 2022-12-02 ENCOUNTER — Telehealth: Payer: Self-pay | Admitting: Gastroenterology

## 2022-12-02 NOTE — Telephone Encounter (Signed)
Patient called requesting to speak with a nurse states she is having a really hard time swallowing her food.

## 2022-12-03 ENCOUNTER — Other Ambulatory Visit: Payer: Self-pay

## 2022-12-03 NOTE — Telephone Encounter (Signed)
Patient called in with complaints of difficulty swallowing & pain in the middle of her chest. Feels that food is getting stuck again. She says at times she will drink something to get food down & it will come back up in her mouth. She's taking pantoprazole 40 mg every day & flovent inhaler BID. She was unsure if pain was r/t to reflux or a heart attack, but she had taken GI cocktail which provided relief. Advised her that per [facility] she does have two more refills and to continue taking & that if pain becomes severe she should go to ED. She was last seen with Dr. Chales Abrahams for EGD w/dilation on 10/08/22. Will route to MD for further recommendations.

## 2022-12-03 NOTE — Telephone Encounter (Signed)
Left message for patient to call back  

## 2022-12-04 ENCOUNTER — Other Ambulatory Visit: Payer: Self-pay

## 2022-12-04 DIAGNOSIS — R1310 Dysphagia, unspecified: Secondary | ICD-10-CM

## 2022-12-04 MED ORDER — OMEPRAZOLE 40 MG PO CPDR: 40.0000 mg | DELAYED_RELEASE_CAPSULE | Freq: Two times a day (BID) | ORAL | 2 refills | Status: DC

## 2022-12-04 NOTE — Addendum Note (Signed)
Addended by: Sharyon Medicus H on: 12/04/2022 11:15 AM   Modules accepted: Orders

## 2022-12-04 NOTE — Telephone Encounter (Signed)
Spoke with patient regarding MD recommendations. Prescription has been sent to [facility] pharmacy per pt request. I did confirm with [facility] yesterday that refills were available for GI cocktail & patient confirmed as well. Barium swallow ordered & faxed to [facility] [phone]. Per Melissa with [facility], they will reach out to schedule patient once order has been received. Pt provided number to their office & advised to call by next week if she has not heard from their office.

## 2022-12-04 NOTE — Telephone Encounter (Signed)
Lets -  change Protonix to omeprazole 40 mg p.o. twice daily #60, 2RF.  Can open capsule and take it with applesauce.  Do not chew. - continue GI cocktail as needed.  [facility]  So she must be getting from [facility] drug - proceed with barium swallow with barium tablet  ASAP (Here or [facility]) - can take Ensure 1 can p.o. twice daily  RG

## 2022-12-11 ENCOUNTER — Telehealth: Payer: Self-pay | Admitting: Gastroenterology

## 2022-12-11 DIAGNOSIS — C4A9 Merkel cell carcinoma, unspecified: Secondary | ICD-10-CM | POA: Diagnosis not present

## 2022-12-11 DIAGNOSIS — Z7901 Long term (current) use of anticoagulants: Secondary | ICD-10-CM | POA: Diagnosis not present

## 2022-12-11 DIAGNOSIS — C4A72 Merkel cell carcinoma of left lower limb, including hip: Secondary | ICD-10-CM | POA: Diagnosis not present

## 2022-12-11 DIAGNOSIS — N398 Other specified disorders of urinary system: Secondary | ICD-10-CM | POA: Diagnosis not present

## 2022-12-11 DIAGNOSIS — R339 Retention of urine, unspecified: Secondary | ICD-10-CM | POA: Diagnosis not present

## 2022-12-11 DIAGNOSIS — R300 Dysuria: Secondary | ICD-10-CM | POA: Diagnosis not present

## 2022-12-11 DIAGNOSIS — I2510 Atherosclerotic heart disease of native coronary artery without angina pectoris: Secondary | ICD-10-CM | POA: Diagnosis not present

## 2022-12-11 DIAGNOSIS — M792 Neuralgia and neuritis, unspecified: Secondary | ICD-10-CM | POA: Diagnosis not present

## 2022-12-11 DIAGNOSIS — E119 Type 2 diabetes mellitus without complications: Secondary | ICD-10-CM | POA: Diagnosis not present

## 2022-12-11 DIAGNOSIS — J449 Chronic obstructive pulmonary disease, unspecified: Secondary | ICD-10-CM | POA: Diagnosis not present

## 2022-12-11 NOTE — Telephone Encounter (Signed)
Left detailed message for patient that orders have been faxed to [facility]. Provided imaging number for patient & advised she call if she has not heard from their office by next week. Also advised she call back with any further questions/concerns.

## 2022-12-11 NOTE — Telephone Encounter (Signed)
PT was told she needed to be scheduled for a barium swallow. She wants to  know if she can have it done at [place]. Please leave a voicemail if she does not answer

## 2022-12-15 ENCOUNTER — Other Ambulatory Visit: Payer: Self-pay

## 2022-12-15 ENCOUNTER — Encounter: Payer: Self-pay | Admitting: Allergy and Immunology

## 2022-12-15 ENCOUNTER — Ambulatory Visit (INDEPENDENT_AMBULATORY_CARE_PROVIDER_SITE_OTHER): Payer: Medicare Other | Admitting: Allergy and Immunology

## 2022-12-15 VITALS — BP 122/70 | HR 70 | Temp 97.9°F | Resp 18 | Ht 65.0 in | Wt 157.4 lb

## 2022-12-15 DIAGNOSIS — K200 Eosinophilic esophagitis: Secondary | ICD-10-CM | POA: Diagnosis not present

## 2022-12-15 DIAGNOSIS — K2100 Gastro-esophageal reflux disease with esophagitis, without bleeding: Secondary | ICD-10-CM

## 2022-12-15 MED ORDER — DUPILUMAB 300 MG/2ML ~~LOC~~ SOSY
300.0000 mg | PREFILLED_SYRINGE | Freq: Once | SUBCUTANEOUS | Status: AC
  Administered 2022-12-15: 300 mg via SUBCUTANEOUS

## 2022-12-15 NOTE — Progress Notes (Unsigned)
Immunotherapy   Patient Details  Name: Bailey Nguyen MRN: [id number] Date of Birth: 1945-10-21  12/15/2022  Bailey Nguyen started injections for  Dupixent  Frequency:1 time per week Consent signed and patient instructions given. Patient received a 300mg  sample of Dupixent today for EOE. Patient is due back next week for another dupixent injection per Dr. Lucie Leather. Patient signed consent form and waited 30 minutes in office with no reaction from the injection.   Orson Aloe 12/15/2022, 6:53 PM

## 2022-12-15 NOTE — Progress Notes (Unsigned)
[place] - [place] - [place]   Dear Chales Abrahams,  Thank you for referring Bailey Nguyen to the [facility] [place] on 12/15/2022.   Below is a summation of this patient's evaluation and recommendations.  Thank you for your referral. I will keep you informed about this patient's response to treatment.   If you have any questions please do not hesitate to contact me.   Sincerely,  Jessica Priest, MD Allergy / Immunology [facility]   ______________________________________________________________________    NEW PATIENT NOTE  Referring Provider: Lynann Bologna, MD Primary Provider: Ailene Ravel, MD Date of office visit: 12/15/2022    Subjective:   Chief Complaint:  Bailey Nguyen (DOB: [birth year]) is a 77 y.o. female who presents to the clinic on 12/15/2022 with a chief complaint of Allergies (Swollen throat, difficult to swallow.), Urticaria (She breaks out in hives every now and then. She don't known what causes her to break out. She uses epi-pen), and Establish Care .     HPI: Bailey Nguyen presents to this clinic in evaluation of swallowing problems.  She tells me that she has received 5 upper endoscopies and dilations with an average frequency velocity once a year for issues with obstruction to swallowing.  Her most recent dilation was with Dr. Chales Abrahams in April 2024 and she did not really get the relief that she expected with this dilation.  She still has food that gets stuck and then she starts drinking to try and push it down into her stomach and then it sounds as though she ends up choking with the fluid in sometimes developing emesis.  She also has lots of regurgitation on a regular basis.  Even though she is having therapy with a proton pump inhibitor she still continues to have problems with burning and regurgitation.  She was placed on Flovent to swallow but she does not really  use that very often because she never really thought it helped her very much.  She drinks 1 coffee per day and eats as much chocolate as possible.  Past Medical History:  Diagnosis Date   Anxiety    Arthritis    Back ache    Cataract    il cataracts removed   Chest wall pain    Chronic constipation    Clotting disorder (HCC)    COPD (chronic obstructive pulmonary disease) (HCC)    Coronary artery calcification seen on CT scan 01/01/2016   Diabetes mellitus without complication (HCC)    Diverticula of intestine    sigmond   Dyskinesia of esophagus    Emphysema of lung (HCC)    GERD (gastroesophageal reflux disease)    Glaucoma    History of COVID-19 05/2020   Hx of colonic polyps    Hx of Lyme disease    Hx of TIA (transient ischemic attack) and stroke    Hypercholesteremia    Lung cancer (HCC)    Removed upper right portion of lung    Neck pain    Neuromuscular disorder (HCC)    Osteoporosis    Oxygen deficiency    Prediabetes    Splenic infarct    Stroke [facility])    TIA   Tobacco use disorder     Past Surgical History:  Procedure Laterality Date   BACK SURGERY     BIOPSY  07/09/2020   Procedure: BIOPSY;  Surgeon: Lynann Bologna, MD;  Location: [facility];  Service: Endoscopy;;   BIOPSY  03/17/2022   Procedure: BIOPSY;  Surgeon: Lynann Bologna, MD;  Location: [facility];  Service: Gastroenterology;;   BIOPSY  10/08/2022   Procedure: BIOPSY;  Surgeon: Lynann Bologna, MD;  Location: [facility];  Service: Gastroenterology;;   CHOLECYSTECTOMY     COLONOSCOPY  09/22/2013   Moderate predominantly sigmoid diverticulosis. Moderate internal hemorrhoids. Otherwise normal colonoscopy. Limited due to qualityt of prep/.   COLONOSCOPY WITH PROPOFOL N/A 03/17/2022   Procedure: COLONOSCOPY WITH PROPOFOL;  Surgeon: Lynann Bologna, MD;  Location: [facility];  Service: Gastroenterology;  Laterality: N/A;   ESOPHAGOGASTRODUODENOSCOPY  03/05/2017   Peptic esophageal stricture. Gastritis.     ESOPHAGOGASTRODUODENOSCOPY (EGD) WITH PROPOFOL N/A 07/09/2020   Procedure: ESOPHAGOGASTRODUODENOSCOPY (EGD) WITH PROPOFOL;  Surgeon: Lynann Bologna, MD;  Location: [facility];  Service: Endoscopy;  Laterality: N/A;   ESOPHAGOGASTRODUODENOSCOPY (EGD) WITH PROPOFOL N/A 03/17/2022   Procedure: ESOPHAGOGASTRODUODENOSCOPY (EGD) WITH PROPOFOL;  Surgeon: Lynann Bologna, MD;  Location: [facility];  Service: Gastroenterology;  Laterality: N/A;   ESOPHAGOGASTRODUODENOSCOPY (EGD) WITH PROPOFOL N/A 10/08/2022   Procedure: ESOPHAGOGASTRODUODENOSCOPY (EGD) WITH PROPOFOL;  Surgeon: Lynann Bologna, MD;  Location: [facility];  Service: Gastroenterology;  Laterality: N/A;   LUNG REMOVAL, PARTIAL Right 01/2015   LUNG SURGERY Left 07/24/2018   MALONEY DILATION  07/09/2020   Procedure: MALONEY DILATION;  Surgeon: Lynann Bologna, MD;  Location: [facility];  Service: Endoscopy;;   [name] DILATION  03/17/2022   Procedure: [name] DILATION;  Surgeon: Lynann Bologna, MD;  Location: [facility];  Service: Gastroenterology;;   [name] DILATION  10/08/2022   Procedure: Alvy Beal;  Surgeon: Lynann Bologna, MD;  Location: [facility];  Service: Gastroenterology;;   POLYPECTOMY  03/17/2022   Procedure: POLYPECTOMY;  Surgeon: Lynann Bologna, MD;  Location: [facility];  Service: Gastroenterology;;   TUBAL LIGATION      Allergies as of 12/15/2022   No Known Allergies      Medication List    albuterol 108 (90 Base) MCG/ACT inhaler Commonly known as: VENTOLIN HFA Inhale 2 puffs into the lungs every 6 (six) hours as needed for wheezing or shortness of breath.   AMBULATORY NON FORMULARY MEDICATION Medication Name: GI Cocktail equal parts maalox, dicyclomine, and 2% viscous lidocaine Take 15ml every 8 hours as needed   Cequa 0.09 % Soln Generic drug: cycloSPORINE (PF) Apply 1 drop to eye 2 (two) times daily.   Eliquis 5 MG Tabs tablet Generic drug: apixaban Take 5 mg by mouth 2 (two) times daily.    EPINEPHrine 0.3 mg/0.3 mL Soaj injection Commonly known as: EPI-PEN Inject 0.3 mg into the muscle as needed for anaphylaxis.   fluticasone 110 MCG/ACT inhaler Commonly known as: FLOVENT HFA Inhale 1 puff into the lungs 2 (two) times daily. For one month and then daily for two months MUST SWALLOW. Do not eat or drink or rinse for 20-30 minutes thereafter. Then can rinse with warm water.   formoterol 20 MCG/2ML nebulizer solution Commonly known as: PERFOROMIST Inhale 20 mcg into the lungs 2 (two) times daily as needed (wheezing).   nateglinide 60 MG tablet Commonly known as: STARLIX Take 60 mg by mouth 2 (two) times daily with a meal. Take 1-30 minutes before meals   omeprazole 40 MG capsule Commonly known as: PRILOSEC Take 1 capsule (40 mg total) by mouth 2 (two) times daily.   oxyCODONE 5 MG immediate release tablet Commonly known as: Oxy IR/ROXICODONE Take 5 mg by mouth every 4 (four) hours as needed for moderate pain.  OXYGEN Inhale 2 L into the lungs at bedtime.   polyethylene glycol 17 g packet Commonly known as: MIRALAX / GLYCOLAX Take 17 g by mouth daily as needed for constipation.   potassium chloride 10 MEQ tablet Commonly known as: KLOR-CON Take 10 mEq by mouth daily.   pregabalin 50 MG capsule Commonly known as: LYRICA Take 50 mg by mouth 2 (two) times daily.   promethazine 25 MG tablet Commonly known as: PHENERGAN Take 12.5 mg by mouth every 8 (eight) hours as needed for nausea or vomiting.   rosuvastatin 40 MG tablet Commonly known as: CRESTOR Take 40 mg by mouth daily.    Review of systems negative except as noted in HPI / PMHx or noted below:  Review of Systems  Constitutional: Negative.   HENT: Negative.    Eyes: Negative.   Respiratory:         Has a history of COPD from tobacco smoking which she discontinued in 2021.  Has an albuterol inhaler and albuterol nebulization which she uses less than 1 time per month.  Cardiovascular: Negative.    Gastrointestinal: Negative.   Genitourinary: Negative.   Musculoskeletal: Negative.   Skin:        Has a history of developing hives since her 15s.  Her frequency is once every other year or so and she will have an episode of hives unassociated with any systemic or constitutional symptoms.  She has been given an EpiPen for this issue in the past.  Neurological: Negative.   Endo/Heme/Allergies: Negative.   Psychiatric/Behavioral: Negative.      Family History  Problem Relation Age of Onset   Coronary artery disease Mother 16   Diabetes Father    Pancreatic cancer Father    Diabetes Sister    Diabetes Brother    Lung cancer Brother    Thyroid cancer Daughter    Colon cancer Neg Hx    Esophageal cancer Neg Hx    Stomach cancer Neg Hx    Rectal cancer Neg Hx     Social History   Socioeconomic History   Marital status: Married    Spouse name: Not on file   Number of children: 5   Years of education: Not on file   Highest education level: Not on file  Occupational History   Not on file  Tobacco Use   Smoking status: Former    Current packs/day: 0.00    Types: Cigarettes    Quit date: 02/01/2015    Years since quitting: 7.8   Smokeless tobacco: Never  Vaping Use   Vaping status: Never Used  Substance and Sexual Activity   Alcohol use: No    Alcohol/week: 0.0 standard drinks of alcohol   Drug use: No   Sexual activity: Not on file  Other Topics Concern   Not on file  Social History Narrative   Not on file   Social Determinants of Health   Financial Resource Strain: Low Risk  (06/07/2020)   Received [facility], [facility]   Overall Financial Resource Strain (CARDIA)    Difficulty of Paying Living Expenses: Not very hard  Food Insecurity: No Food Insecurity (06/07/2020)   Received from [facility], [facility]   Hunger Vital Sign    Worried About Running Out of Food in the Last Year: Never true    Ran Out of Food in the Last Year: Never true   Transportation Needs: No Transportation Needs (06/07/2020)   Received from  [facility], [facility]   PRAPARE - Transportation    Lack of Transportation (Medical): No    Lack of Transportation (Non-Medical): No  Physical Activity: Inactive (06/07/2020)   Received from [facility], [facility]   Exercise Vital Sign    Days of Exercise per Week: 0 days    Minutes of Exercise per Session: 0 min  Stress: No Stress Concern Present (06/07/2020)   Received from [facility], [facility] of Occupational Health - Occupational Stress Questionnaire    Feeling of Stress : Not at all  Social Connections: Moderately Isolated (06/07/2020)   Received from [facility], [facility]   Social Connection and Isolation Panel [NHANES]    Frequency of Communication with Friends and Family: More than three times a week    Frequency of Social Gatherings with Friends and Family: More than three times a week    Attends Religious Services: Never    Database administrator or Organizations: No    Attends Banker Meetings: Never    Marital Status: Married  Catering manager Violence: Not At Risk (06/07/2020)   Received from [facility], [facility]   Humiliation, Afraid, Rape, and Kick questionnaire    Fear of Current or Ex-Partner: No    Emotionally Abused: No    Physically Abused: No    Sexually Abused: No    Environmental and Social history  Lives in a house with a dry environment, no animals located inside the household, carpet in the bedroom, no plastic on the bed, no plastic on the pillow, no smoking ongoing with inside the household.  Objective:   Vitals:   12/15/22 1418  BP: 122/70  Pulse: 70  Resp: 18  Temp: 97.9 F (36.6 C)  SpO2: 94%     Weight: 157 lb 6.4 oz (71.4 kg)  Physical Exam Constitutional:      Appearance: She is not diaphoretic.  HENT:     Head: Normocephalic.     Right Ear: Tympanic membrane, ear canal  and external ear normal.     Left Ear: Tympanic membrane, ear canal and external ear normal.     Nose: Nose normal. No mucosal edema or rhinorrhea.     Mouth/Throat:     Pharynx: Uvula midline. No oropharyngeal exudate.  Eyes:     Conjunctiva/sclera: Conjunctivae normal.  Neck:     Thyroid: No thyromegaly.     Trachea: Trachea normal. No tracheal tenderness or tracheal deviation.  Cardiovascular:     Rate and Rhythm: Normal rate and regular rhythm.     Heart sounds: Normal heart sounds, S1 normal and S2 normal. No murmur heard. Pulmonary:     Effort: No respiratory distress.     Breath sounds: Normal breath sounds. No stridor. No wheezing or rales.  Lymphadenopathy:     Head:     Right side of head: No tonsillar adenopathy.     Left side of head: No tonsillar adenopathy.     Cervical: No cervical adenopathy.  Skin:    Findings: No erythema or rash.     Nails: There is no clubbing.  Neurological:     Mental Status: She is alert.     Diagnostics: Allergy skin tests were performed.   Results of a upper endoscopy biopsy performed 08 Oct 2022 identified the following:  A. STOMACH, BIOPSY:  Gastric mucosa with hyperemia and reactive changes.  Negative for Helicobacter  pylori.   B. ESOPHAGUS, PROXIMAL, DISTAL, BIOPSY:  Squamous mucosa with focally increased intra epithelial eosinophils.  Focally greater than 20 per high-power field.   Results of blood tests obtained 29 November 2022 identified WBC 5.7, absolute eosinophil 0, absolute basophil 0, absolute lymphocyte 1100, hemoglobin 13.7, platelet 268   Assessment and Plan:    1. Eosinophilic esophagitis     Patient Instructions   1. Allergen avoidance measures  2. Slowly decrease caffeine and chocolate consumption  3. Pantoprazole 40 mg - 1 tablet 2 times per day  4. Start Dupilumab 300 mg every week . First dose now and then every week.   5. Return to clinic in 4 weeks or earlier if problem.   Jessica Priest,  MD Allergy / Immunology [facility] [place]

## 2022-12-15 NOTE — Patient Instructions (Signed)
  1. Slowly decrease caffeine and chocolate consumption  2. Pantoprazole 40 mg - 1 tablet 2 times per day  3. Start Dupilumab 300 mg every week . First dose now and then every week.   4. Return to clinic in 4 weeks or earlier if problem.

## 2022-12-16 ENCOUNTER — Telehealth: Payer: Self-pay | Admitting: *Deleted

## 2022-12-16 ENCOUNTER — Encounter: Payer: Self-pay | Admitting: Allergy and Immunology

## 2022-12-16 MED ORDER — DUPIXENT 300 MG/2ML ~~LOC~~ SOSY: 300.0000 mg | PREFILLED_SYRINGE | SUBCUTANEOUS | 3 refills | Status: AC

## 2022-12-16 NOTE — Telephone Encounter (Signed)
Called patient and advised that will send 90 day supply to [facility] due to her champva coverage. Patient requested to get another injection next week since she is due once weekly and [facility] can take up to 20 daysto send rx,  I advised her we could only do one more due to limitation on samples.

## 2022-12-16 NOTE — Telephone Encounter (Signed)
L/m for patient to contact me regarding Dupixent to Med by Mail for EOE

## 2022-12-17 NOTE — Addendum Note (Signed)
Addended by: Briant Cedar L on: 12/17/2022 10:01 AM   Modules accepted: Orders

## 2022-12-17 NOTE — Addendum Note (Signed)
Addended by: Orson Aloe on: 12/17/2022 01:27 PM   Modules accepted: Orders

## 2022-12-18 DIAGNOSIS — M7989 Other specified soft tissue disorders: Secondary | ICD-10-CM | POA: Diagnosis not present

## 2022-12-18 DIAGNOSIS — G629 Polyneuropathy, unspecified: Secondary | ICD-10-CM | POA: Diagnosis not present

## 2022-12-18 DIAGNOSIS — N319 Neuromuscular dysfunction of bladder, unspecified: Secondary | ICD-10-CM | POA: Diagnosis not present

## 2022-12-18 DIAGNOSIS — R339 Retention of urine, unspecified: Secondary | ICD-10-CM | POA: Diagnosis not present

## 2022-12-19 DIAGNOSIS — N319 Neuromuscular dysfunction of bladder, unspecified: Secondary | ICD-10-CM | POA: Diagnosis not present

## 2022-12-19 DIAGNOSIS — R339 Retention of urine, unspecified: Secondary | ICD-10-CM | POA: Diagnosis not present

## 2022-12-21 DIAGNOSIS — R339 Retention of urine, unspecified: Secondary | ICD-10-CM | POA: Diagnosis not present

## 2022-12-22 ENCOUNTER — Ambulatory Visit: Payer: Medicare Other | Admitting: *Deleted

## 2022-12-22 DIAGNOSIS — K200 Eosinophilic esophagitis: Secondary | ICD-10-CM

## 2022-12-22 MED ORDER — DUPILUMAB 300 MG/2ML ~~LOC~~ SOSY
300.0000 mg | PREFILLED_SYRINGE | Freq: Once | SUBCUTANEOUS | Status: AC
  Administered 2022-12-22: 300 mg via SUBCUTANEOUS

## 2022-12-28 DIAGNOSIS — R399 Unspecified symptoms and signs involving the genitourinary system: Secondary | ICD-10-CM | POA: Diagnosis not present

## 2023-01-13 DIAGNOSIS — R310 Gross hematuria: Secondary | ICD-10-CM | POA: Diagnosis not present

## 2023-01-13 DIAGNOSIS — K5790 Diverticulosis of intestine, part unspecified, without perforation or abscess without bleeding: Secondary | ICD-10-CM | POA: Diagnosis not present

## 2023-01-13 DIAGNOSIS — F419 Anxiety disorder, unspecified: Secondary | ICD-10-CM | POA: Diagnosis not present

## 2023-01-13 DIAGNOSIS — Z79899 Other long term (current) drug therapy: Secondary | ICD-10-CM | POA: Diagnosis not present

## 2023-01-13 DIAGNOSIS — E039 Hypothyroidism, unspecified: Secondary | ICD-10-CM | POA: Diagnosis not present

## 2023-01-13 DIAGNOSIS — R300 Dysuria: Secondary | ICD-10-CM | POA: Diagnosis not present

## 2023-01-13 DIAGNOSIS — I2510 Atherosclerotic heart disease of native coronary artery without angina pectoris: Secondary | ICD-10-CM | POA: Diagnosis not present

## 2023-01-13 DIAGNOSIS — H539 Unspecified visual disturbance: Secondary | ICD-10-CM | POA: Diagnosis not present

## 2023-01-13 DIAGNOSIS — Z7901 Long term (current) use of anticoagulants: Secondary | ICD-10-CM | POA: Diagnosis not present

## 2023-01-13 DIAGNOSIS — K219 Gastro-esophageal reflux disease without esophagitis: Secondary | ICD-10-CM | POA: Diagnosis not present

## 2023-01-13 DIAGNOSIS — R339 Retention of urine, unspecified: Secondary | ICD-10-CM | POA: Diagnosis not present

## 2023-01-13 DIAGNOSIS — C4A9 Merkel cell carcinoma, unspecified: Secondary | ICD-10-CM | POA: Diagnosis not present

## 2023-01-13 DIAGNOSIS — N362 Urethral caruncle: Secondary | ICD-10-CM | POA: Diagnosis not present

## 2023-01-13 DIAGNOSIS — E11620 Type 2 diabetes mellitus with diabetic dermatitis: Secondary | ICD-10-CM | POA: Diagnosis not present

## 2023-01-13 DIAGNOSIS — I639 Cerebral infarction, unspecified: Secondary | ICD-10-CM | POA: Diagnosis not present

## 2023-01-13 DIAGNOSIS — J449 Chronic obstructive pulmonary disease, unspecified: Secondary | ICD-10-CM | POA: Diagnosis not present

## 2023-01-13 DIAGNOSIS — R1310 Dysphagia, unspecified: Secondary | ICD-10-CM | POA: Diagnosis not present

## 2023-01-13 DIAGNOSIS — N398 Other specified disorders of urinary system: Secondary | ICD-10-CM | POA: Diagnosis not present

## 2023-01-19 ENCOUNTER — Ambulatory Visit: Payer: Medicare Other | Admitting: Allergy and Immunology

## 2023-01-27 DIAGNOSIS — H04123 Dry eye syndrome of bilateral lacrimal glands: Secondary | ICD-10-CM | POA: Diagnosis not present

## 2023-01-27 DIAGNOSIS — H401132 Primary open-angle glaucoma, bilateral, moderate stage: Secondary | ICD-10-CM | POA: Diagnosis not present

## 2023-01-29 ENCOUNTER — Telehealth: Payer: Self-pay | Admitting: Gastroenterology

## 2023-01-29 NOTE — Telephone Encounter (Signed)
Inbound call from patient requesting to speak with a nurse to discuss symptoms and possibly a sooner apt. Patient scheduled for next available. Please advise.   Thank you

## 2023-01-29 NOTE — Telephone Encounter (Signed)
Patient called in with ongoing complaints of dysphagia. She is seeing the allergist & receiving weekly injections which has helped some. Last dilation was 10/08/22 & she doesn't think it was stretched enough. Takes omeprazole 40 mg BID. Dr. Chales Abrahams previously advised her to have a barium swallow w/tablet done & she has declined. States she has done it before & the tablet gets stuck, and doesn't want a test done that will just tell her she has trouble swallowing. Requesting EGD w/dilation. Advised her Dr. Chales Abrahams is currently out of the office & will return on 9/30. Discussed ED precautions with her if symptoms worsen in the meantime. She's currently scheduled to see Dr. Chales Abrahams for OV in December.

## 2023-01-30 DIAGNOSIS — J449 Chronic obstructive pulmonary disease, unspecified: Secondary | ICD-10-CM | POA: Diagnosis not present

## 2023-01-30 DIAGNOSIS — R300 Dysuria: Secondary | ICD-10-CM | POA: Diagnosis not present

## 2023-01-30 DIAGNOSIS — E109 Type 1 diabetes mellitus without complications: Secondary | ICD-10-CM | POA: Diagnosis not present

## 2023-01-30 DIAGNOSIS — R350 Frequency of micturition: Secondary | ICD-10-CM | POA: Diagnosis not present

## 2023-01-30 DIAGNOSIS — N390 Urinary tract infection, site not specified: Secondary | ICD-10-CM | POA: Diagnosis not present

## 2023-02-02 ENCOUNTER — Encounter: Payer: Self-pay | Admitting: Allergy and Immunology

## 2023-02-02 ENCOUNTER — Other Ambulatory Visit: Payer: Self-pay

## 2023-02-02 ENCOUNTER — Ambulatory Visit (INDEPENDENT_AMBULATORY_CARE_PROVIDER_SITE_OTHER): Payer: Medicare Other | Admitting: Allergy and Immunology

## 2023-02-02 VITALS — BP 112/62 | HR 68 | Temp 98.1°F | Resp 18

## 2023-02-02 DIAGNOSIS — K200 Eosinophilic esophagitis: Secondary | ICD-10-CM

## 2023-02-02 DIAGNOSIS — K2100 Gastro-esophageal reflux disease with esophagitis, without bleeding: Secondary | ICD-10-CM

## 2023-02-02 MED ORDER — OMEPRAZOLE 40 MG PO CPDR: 40.0000 mg | DELAYED_RELEASE_CAPSULE | Freq: Two times a day (BID) | ORAL | 3 refills | Status: DC

## 2023-02-02 NOTE — Progress Notes (Unsigned)
[place] - [place] - [place] - [place] - [place]   Follow-up Note  Referring Provider: Ailene Ravel, MD Primary Provider: Ailene Ravel, MD Date of Office Visit: 02/02/2023  Subjective:   Bailey Nguyen (DOB: 05-02-1946) is a 77 y.o. female [facility] in re-evaluation of the following:  HPI: Bailey Nguyen presents to this clinic in evaluation of eosinophilic esophagitis and reflux.  I last saw her in this clinic during her initial evaluation of 15 December 2022 at which point in time we started her on dupilumab.  She can definitely tell that her swallowing is a lot easier since she has been using her dupilumab.  She has received 6 injections of dupilumab.  She still has some problems with transient obstruction of food that lasts less than a minute but that has also decreased in both intensity and frequency.  Allergies as of 02/02/2023   No Known Allergies      Medication List    albuterol 108 (90 Base) MCG/ACT inhaler Commonly known as: VENTOLIN HFA Inhale 2 puffs into the lungs every 6 (six) hours as needed for wheezing or shortness of breath.   AMBULATORY NON FORMULARY MEDICATION Medication Name: GI Cocktail equal parts maalox, dicyclomine, and 2% viscous lidocaine Take 15ml every 8 hours as needed   Cequa 0.09 % Soln Generic drug: cycloSPORINE (PF) Apply 1 drop to eye 2 (two) times daily.   Dupixent 300 MG/2ML prefilled syringe Generic drug: dupilumab Inject 300 mg into the skin every 7 (seven) days.   Eliquis 5 MG Tabs tablet Generic drug: apixaban Take 5 mg by mouth 2 (two) times daily.   EPINEPHrine 0.3 mg/0.3 mL Soaj injection Commonly known as: EPI-PEN Inject 0.3 mg into the muscle as needed for anaphylaxis.   fluticasone 110 MCG/ACT inhaler Commonly known as: FLOVENT HFA Inhale 1 puff into the lungs 2 (two) times daily. For one month and then daily for two months MUST SWALLOW. Do not eat or drink or rinse  for 20-30 minutes thereafter. Then can rinse with warm water.   formoterol 20 MCG/2ML nebulizer solution Commonly known as: PERFOROMIST Inhale 20 mcg into the lungs 2 (two) times daily as needed (wheezing).   nateglinide 60 MG tablet Commonly known as: STARLIX Take 60 mg by mouth 2 (two) times daily with a meal. Take 1-30 minutes before meals   nitrofurantoin (macrocrystal-monohydrate) 100 MG capsule Commonly known as: MACROBID Take 100 mg by mouth every 12 (twelve) hours.   nystatin 100000 UNIT/ML suspension Commonly known as: MYCOSTATIN Take by mouth.   omeprazole 40 MG capsule Commonly known as: PRILOSEC Take 1 capsule (40 mg total) by mouth 2 (two) times daily.   oxyCODONE 5 MG immediate release tablet Commonly known as: Oxy IR/ROXICODONE Take 5 mg by mouth every 4 (four) hours as needed for moderate pain.   OXYGEN Inhale 2 L into the lungs at bedtime.   polyethylene glycol 17 g packet Commonly known as: MIRALAX / GLYCOLAX Take 17 g by mouth daily as needed for constipation.   potassium chloride 10 MEQ tablet Commonly known as: KLOR-CON Take 10 mEq by mouth daily.   pregabalin 50 MG capsule Commonly known as: LYRICA Take 50 mg by mouth 2 (two) times daily.   promethazine 25 MG tablet Commonly known as: PHENERGAN Take 12.5 mg by mouth every 8 (eight) hours as needed for nausea or vomiting.   rosuvastatin 40 MG tablet Commonly known as: CRESTOR Take 40 mg  by mouth daily.   Tafluprost (PF) 0.0015 % Soln SMARTSIG:1 Drop(s) In Eye(s) Every Evening    Past Medical History:  Diagnosis Date   Anxiety    Arthritis    Back ache    Cataract    il cataracts removed   Chest wall pain    Chronic constipation    Clotting disorder (HCC)    COPD (chronic obstructive pulmonary disease) (HCC)    Coronary artery calcification seen on CT scan 01/01/2016   Diabetes mellitus without complication (HCC)    Diverticula of intestine    sigmond   Dyskinesia of esophagus     Emphysema of lung (HCC)    GERD (gastroesophageal reflux disease)    Glaucoma    History of COVID-19 05/2020   Hx of colonic polyps    Hx of Lyme disease    Hx of TIA (transient ischemic attack) and stroke    Hypercholesteremia    Lung cancer (HCC)    Removed upper right portion of lung    Neck pain    Neuromuscular disorder (HCC)    Osteoporosis    Oxygen deficiency    Prediabetes    Splenic infarct    Stroke [facility])    TIA   Tobacco use disorder     Past Surgical History:  Procedure Laterality Date   BACK SURGERY     BIOPSY  07/09/2020   Procedure: BIOPSY;  Surgeon: Lynann Bologna, MD;  Location: [facility];  Service: Endoscopy;;   BIOPSY  03/17/2022   Procedure: BIOPSY;  Surgeon: Lynann Bologna, MD;  Location: [facility];  Service: Gastroenterology;;   BIOPSY  10/08/2022   Procedure: BIOPSY;  Surgeon: Lynann Bologna, MD;  Location: [facility];  Service: Gastroenterology;;   CHOLECYSTECTOMY     COLONOSCOPY  09/22/2013   Moderate predominantly sigmoid diverticulosis. Moderate internal hemorrhoids. Otherwise normal colonoscopy. Limited due to qualityt of prep/.   COLONOSCOPY WITH PROPOFOL N/A 03/17/2022   Procedure: COLONOSCOPY WITH PROPOFOL;  Surgeon: Lynann Bologna, MD;  [facility];  Service: Gastroenterology;  Laterality: N/A;   ESOPHAGOGASTRODUODENOSCOPY  03/05/2017   Peptic esophageal stricture. Gastritis.    ESOPHAGOGASTRODUODENOSCOPY (EGD) WITH PROPOFOL N/A 07/09/2020   Procedure: ESOPHAGOGASTRODUODENOSCOPY (EGD) WITH PROPOFOL;  Surgeon: Lynann Bologna, MD;  [facility];  Service: Endoscopy;  Laterality: N/A;   ESOPHAGOGASTRODUODENOSCOPY (EGD) WITH PROPOFOL N/A 03/17/2022   Procedure: ESOPHAGOGASTRODUODENOSCOPY (EGD) WITH PROPOFOL;  Surgeon: Lynann Bologna, MD;  [facility];  Service: Gastroenterology;  Laterality: N/A;   ESOPHAGOGASTRODUODENOSCOPY (EGD) WITH PROPOFOL N/A 10/08/2022   Procedure: ESOPHAGOGASTRODUODENOSCOPY (EGD) WITH PROPOFOL;   Surgeon: Lynann Bologna, MD;  [facility];  Service: Gastroenterology;  Laterality: N/A;   LUNG REMOVAL, PARTIAL Right 01/2015   LUNG SURGERY Left 07/24/2018   MALONEY DILATION  07/09/2020   Procedure: MALONEY DILATION;  Surgeon: Lynann Bologna, MD;  [facility];  Service: Endoscopy;;   [name] DILATION  03/17/2022   Procedure: [name] DILATION;  Surgeon: Lynann Bologna, MD;  Location: [facility];  Service: Gastroenterology;;   [name] DILATION  10/08/2022   Procedure: Alvy Beal;  Surgeon: Lynann Bologna, MD;  [facility];  Service: Gastroenterology;;   POLYPECTOMY  03/17/2022   Procedure: POLYPECTOMY;  Surgeon: Lynann Bologna, MD;  [facility];  Service: Gastroenterology;;   TUBAL LIGATION      Review of systems negative except as noted in HPI / PMHx or noted below:  Review of Systems  Constitutional: Negative.   HENT: Negative.    Eyes: Negative.  Respiratory: Negative.    Cardiovascular: Negative.   Gastrointestinal: Negative.   Genitourinary: Negative.   Musculoskeletal: Negative.   Skin: Negative.   Neurological: Negative.   Endo/Heme/Allergies: Negative.   Psychiatric/Behavioral: Negative.       Objective:   Vitals:   02/02/23 1117  BP: 112/62  Pulse: 68  Resp: 18  Temp: 98.1 F (36.7 C)  SpO2: 96%          Physical Exam -deferred  Diagnostics: none  Assessment and Plan:   1. Eosinophilic esophagitis   2. Gastroesophageal reflux disease with esophagitis without hemorrhage    1. Slowly decrease caffeine and chocolate consumption  2. Pantoprazole or Omeprazole 40 mg - 1 tablet 2 times per day  3. Dupilumab 300 mg every week [facility] - [phone]  4. Return to clinic in Thanksgiving 2024 or earlier if problem.  5. Plan for fall flu vaccine  Bailey Nguyen appears to be doing better and I would like for her to remain on dupilumab until we regroup with each other in Thanksgiving 2024.  If she continues to have some  transient obstruction of swallowing then she may benefit from a dilation and we may need to send her back to Dr. Chales Abrahams to have that procedure performed.  If she fails dupilumab administration then we will have her use swallowed budesonide on a consistent basis.  Laurette Schimke, MD Allergy / Immunology [facility]

## 2023-02-02 NOTE — Patient Instructions (Addendum)
  1. Slowly decrease caffeine and chocolate consumption  2. Pantoprazole or Omeprazole 40 mg - 1 tablet 2 times per day  3. Dupilumab 300 mg every week Tammy - [phone]  4. Return to clinic in Thanksgiving 2024 or earlier if problem.  5. Plan for fall flu vaccine

## 2023-02-03 ENCOUNTER — Encounter: Payer: Self-pay | Admitting: Allergy and Immunology

## 2023-02-03 NOTE — Telephone Encounter (Signed)
Patient called requesting to speak with a nurse. Stated she is having some pain like about her rib cage and she has not been able to have a bowel movements for about a week now. York Spaniel is seeking a referral for an xray if possible to be done at the hospital. Please advise.

## 2023-02-03 NOTE — Telephone Encounter (Signed)
Pt stated that she feels like she is constipated and might have an obstruction and requesting an xray. Pt stated that her last bowel movement was this AM. Some upper abdominal pain with slight nausea with no emesis.  Pt was encouraged to do a Miralax purge. Instructions were provided with read back confirmed. Pt was notified to call our office tomorrow and ask to speak with Glendora Score RN for a symptom update.  Pt verbalized understanding with all questions answered.

## 2023-02-09 NOTE — Telephone Encounter (Signed)
Proceed with x-ray KUB-2 view If still with constipation, start MiraLAX 17 g p.o. twice daily until a large bowel movement, then once a day RG

## 2023-02-11 NOTE — Telephone Encounter (Signed)
Left message for patient to call back  

## 2023-02-12 NOTE — Telephone Encounter (Signed)
Left message for patient to call back  

## 2023-02-12 NOTE — Telephone Encounter (Signed)
Spoke with patient regarding MD recommendations. She prefers XR to be done at [facility] d/t location. She is feeling better since starting Miralax, but still having some ongoing abdominal pain. Advised her that it would be best to have XR done here for quicker results, however she prefers [facility]. XR ordered & faxed to [facility]. [phone].

## 2023-02-12 NOTE — Telephone Encounter (Signed)
Spoke with patient & clarified that Dr. Chales Abrahams had recommended she take omeprazole 40 mg BID. Pt verbalized all understanding, no further questions.

## 2023-02-12 NOTE — Telephone Encounter (Signed)
Inbound call from patient requesting a call back to discuss GERD medication. States she does not remember if she was advised to take omeprazole or pantoprazole. 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 Please advise, thank you.

## 2023-02-24 DIAGNOSIS — J449 Chronic obstructive pulmonary disease, unspecified: Secondary | ICD-10-CM | POA: Diagnosis not present

## 2023-02-24 DIAGNOSIS — J9611 Chronic respiratory failure with hypoxia: Secondary | ICD-10-CM | POA: Diagnosis not present

## 2023-02-28 DIAGNOSIS — R000 Tachycardia, unspecified: Secondary | ICD-10-CM | POA: Diagnosis not present

## 2023-02-28 DIAGNOSIS — M19011 Primary osteoarthritis, right shoulder: Secondary | ICD-10-CM | POA: Diagnosis not present

## 2023-02-28 DIAGNOSIS — K219 Gastro-esophageal reflux disease without esophagitis: Secondary | ICD-10-CM | POA: Diagnosis not present

## 2023-02-28 DIAGNOSIS — E119 Type 2 diabetes mellitus without complications: Secondary | ICD-10-CM | POA: Diagnosis not present

## 2023-02-28 DIAGNOSIS — I2510 Atherosclerotic heart disease of native coronary artery without angina pectoris: Secondary | ICD-10-CM | POA: Diagnosis not present

## 2023-02-28 DIAGNOSIS — Z7951 Long term (current) use of inhaled steroids: Secondary | ICD-10-CM | POA: Diagnosis not present

## 2023-02-28 DIAGNOSIS — Z87891 Personal history of nicotine dependence: Secondary | ICD-10-CM | POA: Diagnosis not present

## 2023-02-28 DIAGNOSIS — J449 Chronic obstructive pulmonary disease, unspecified: Secondary | ICD-10-CM | POA: Diagnosis not present

## 2023-02-28 DIAGNOSIS — Z7901 Long term (current) use of anticoagulants: Secondary | ICD-10-CM | POA: Diagnosis not present

## 2023-02-28 DIAGNOSIS — Z79899 Other long term (current) drug therapy: Secondary | ICD-10-CM | POA: Diagnosis not present

## 2023-02-28 DIAGNOSIS — M25511 Pain in right shoulder: Secondary | ICD-10-CM | POA: Diagnosis not present

## 2023-03-09 DIAGNOSIS — J449 Chronic obstructive pulmonary disease, unspecified: Secondary | ICD-10-CM | POA: Diagnosis not present

## 2023-03-09 DIAGNOSIS — E119 Type 2 diabetes mellitus without complications: Secondary | ICD-10-CM | POA: Diagnosis not present

## 2023-03-09 DIAGNOSIS — Z87891 Personal history of nicotine dependence: Secondary | ICD-10-CM | POA: Diagnosis not present

## 2023-03-09 DIAGNOSIS — R0602 Shortness of breath: Secondary | ICD-10-CM | POA: Diagnosis not present

## 2023-03-09 DIAGNOSIS — E039 Hypothyroidism, unspecified: Secondary | ICD-10-CM | POA: Diagnosis not present

## 2023-03-09 DIAGNOSIS — E785 Hyperlipidemia, unspecified: Secondary | ICD-10-CM | POA: Diagnosis not present

## 2023-03-09 DIAGNOSIS — R079 Chest pain, unspecified: Secondary | ICD-10-CM | POA: Diagnosis not present

## 2023-03-09 DIAGNOSIS — R062 Wheezing: Secondary | ICD-10-CM | POA: Diagnosis not present

## 2023-03-09 DIAGNOSIS — R0902 Hypoxemia: Secondary | ICD-10-CM | POA: Diagnosis not present

## 2023-03-09 DIAGNOSIS — C8590 Non-Hodgkin lymphoma, unspecified, unspecified site: Secondary | ICD-10-CM | POA: Diagnosis not present

## 2023-03-09 DIAGNOSIS — J441 Chronic obstructive pulmonary disease with (acute) exacerbation: Secondary | ICD-10-CM | POA: Diagnosis not present

## 2023-03-09 DIAGNOSIS — M549 Dorsalgia, unspecified: Secondary | ICD-10-CM | POA: Diagnosis not present

## 2023-03-14 DIAGNOSIS — N390 Urinary tract infection, site not specified: Secondary | ICD-10-CM | POA: Diagnosis not present

## 2023-03-14 DIAGNOSIS — R300 Dysuria: Secondary | ICD-10-CM | POA: Diagnosis not present

## 2023-03-14 DIAGNOSIS — E109 Type 1 diabetes mellitus without complications: Secondary | ICD-10-CM | POA: Diagnosis not present

## 2023-03-14 DIAGNOSIS — J449 Chronic obstructive pulmonary disease, unspecified: Secondary | ICD-10-CM | POA: Diagnosis not present

## 2023-03-29 DIAGNOSIS — I700 Atherosclerosis of aorta: Secondary | ICD-10-CM | POA: Diagnosis not present

## 2023-03-29 DIAGNOSIS — E1165 Type 2 diabetes mellitus with hyperglycemia: Secondary | ICD-10-CM | POA: Diagnosis not present

## 2023-03-29 DIAGNOSIS — R110 Nausea: Secondary | ICD-10-CM | POA: Diagnosis not present

## 2023-03-29 DIAGNOSIS — Z1331 Encounter for screening for depression: Secondary | ICD-10-CM | POA: Diagnosis not present

## 2023-03-29 DIAGNOSIS — B370 Candidal stomatitis: Secondary | ICD-10-CM | POA: Diagnosis not present

## 2023-03-29 DIAGNOSIS — J449 Chronic obstructive pulmonary disease, unspecified: Secondary | ICD-10-CM | POA: Diagnosis not present

## 2023-03-29 DIAGNOSIS — E876 Hypokalemia: Secondary | ICD-10-CM | POA: Diagnosis not present

## 2023-03-29 DIAGNOSIS — E559 Vitamin D deficiency, unspecified: Secondary | ICD-10-CM | POA: Diagnosis not present

## 2023-03-29 DIAGNOSIS — Z9981 Dependence on supplemental oxygen: Secondary | ICD-10-CM | POA: Diagnosis not present

## 2023-03-29 DIAGNOSIS — E7800 Pure hypercholesterolemia, unspecified: Secondary | ICD-10-CM | POA: Diagnosis not present

## 2023-03-29 DIAGNOSIS — J9611 Chronic respiratory failure with hypoxia: Secondary | ICD-10-CM | POA: Diagnosis not present

## 2023-03-29 DIAGNOSIS — K219 Gastro-esophageal reflux disease without esophagitis: Secondary | ICD-10-CM | POA: Diagnosis not present

## 2023-03-30 DIAGNOSIS — C4A72 Merkel cell carcinoma of left lower limb, including hip: Secondary | ICD-10-CM | POA: Diagnosis not present

## 2023-04-06 ENCOUNTER — Ambulatory Visit: Payer: Medicare Other | Admitting: Allergy and Immunology

## 2023-04-14 DIAGNOSIS — H04123 Dry eye syndrome of bilateral lacrimal glands: Secondary | ICD-10-CM | POA: Diagnosis not present

## 2023-04-14 DIAGNOSIS — H401132 Primary open-angle glaucoma, bilateral, moderate stage: Secondary | ICD-10-CM | POA: Diagnosis not present

## 2023-04-20 ENCOUNTER — Ambulatory Visit: Payer: Medicare Other | Admitting: Gastroenterology

## 2023-04-20 DIAGNOSIS — N390 Urinary tract infection, site not specified: Secondary | ICD-10-CM | POA: Diagnosis not present

## 2023-04-21 DIAGNOSIS — N390 Urinary tract infection, site not specified: Secondary | ICD-10-CM | POA: Diagnosis not present

## 2023-05-07 DIAGNOSIS — E876 Hypokalemia: Secondary | ICD-10-CM | POA: Diagnosis not present

## 2023-05-07 DIAGNOSIS — I2510 Atherosclerotic heart disease of native coronary artery without angina pectoris: Secondary | ICD-10-CM | POA: Diagnosis not present

## 2023-05-07 DIAGNOSIS — E119 Type 2 diabetes mellitus without complications: Secondary | ICD-10-CM | POA: Diagnosis not present

## 2023-05-07 DIAGNOSIS — J439 Emphysema, unspecified: Secondary | ICD-10-CM | POA: Diagnosis not present

## 2023-05-07 DIAGNOSIS — J449 Chronic obstructive pulmonary disease, unspecified: Secondary | ICD-10-CM | POA: Diagnosis not present

## 2023-05-07 DIAGNOSIS — R1030 Lower abdominal pain, unspecified: Secondary | ICD-10-CM | POA: Diagnosis not present

## 2023-05-07 DIAGNOSIS — Z87891 Personal history of nicotine dependence: Secondary | ICD-10-CM | POA: Diagnosis not present

## 2023-05-07 DIAGNOSIS — Z7901 Long term (current) use of anticoagulants: Secondary | ICD-10-CM | POA: Diagnosis not present

## 2023-05-07 DIAGNOSIS — Z79899 Other long term (current) drug therapy: Secondary | ICD-10-CM | POA: Diagnosis not present

## 2023-05-10 DIAGNOSIS — E876 Hypokalemia: Secondary | ICD-10-CM | POA: Diagnosis not present

## 2023-05-13 ENCOUNTER — Ambulatory Visit: Payer: Medicare Other | Admitting: Allergy and Immunology

## 2023-06-02 DIAGNOSIS — R339 Retention of urine, unspecified: Secondary | ICD-10-CM | POA: Diagnosis not present

## 2023-06-02 DIAGNOSIS — N398 Other specified disorders of urinary system: Secondary | ICD-10-CM | POA: Diagnosis not present

## 2023-06-02 DIAGNOSIS — I2510 Atherosclerotic heart disease of native coronary artery without angina pectoris: Secondary | ICD-10-CM | POA: Diagnosis not present

## 2023-06-02 DIAGNOSIS — J449 Chronic obstructive pulmonary disease, unspecified: Secondary | ICD-10-CM | POA: Diagnosis not present

## 2023-06-02 DIAGNOSIS — K5790 Diverticulosis of intestine, part unspecified, without perforation or abscess without bleeding: Secondary | ICD-10-CM | POA: Diagnosis not present

## 2023-06-02 DIAGNOSIS — Z87891 Personal history of nicotine dependence: Secondary | ICD-10-CM | POA: Diagnosis not present

## 2023-06-02 DIAGNOSIS — E039 Hypothyroidism, unspecified: Secondary | ICD-10-CM | POA: Diagnosis not present

## 2023-06-02 DIAGNOSIS — N390 Urinary tract infection, site not specified: Secondary | ICD-10-CM | POA: Diagnosis not present

## 2023-06-02 DIAGNOSIS — Z8673 Personal history of transient ischemic attack (TIA), and cerebral infarction without residual deficits: Secondary | ICD-10-CM | POA: Diagnosis not present

## 2023-06-02 DIAGNOSIS — Z7901 Long term (current) use of anticoagulants: Secondary | ICD-10-CM | POA: Diagnosis not present

## 2023-06-02 DIAGNOSIS — Z79899 Other long term (current) drug therapy: Secondary | ICD-10-CM | POA: Diagnosis not present

## 2023-06-02 DIAGNOSIS — R399 Unspecified symptoms and signs involving the genitourinary system: Secondary | ICD-10-CM | POA: Diagnosis not present

## 2023-06-02 DIAGNOSIS — E119 Type 2 diabetes mellitus without complications: Secondary | ICD-10-CM | POA: Diagnosis not present

## 2023-06-02 DIAGNOSIS — Z859 Personal history of malignant neoplasm, unspecified: Secondary | ICD-10-CM | POA: Diagnosis not present

## 2023-06-02 DIAGNOSIS — Z7951 Long term (current) use of inhaled steroids: Secondary | ICD-10-CM | POA: Diagnosis not present

## 2023-06-02 DIAGNOSIS — K219 Gastro-esophageal reflux disease without esophagitis: Secondary | ICD-10-CM | POA: Diagnosis not present

## 2023-06-02 DIAGNOSIS — L309 Dermatitis, unspecified: Secondary | ICD-10-CM | POA: Diagnosis not present

## 2023-06-02 DIAGNOSIS — F419 Anxiety disorder, unspecified: Secondary | ICD-10-CM | POA: Diagnosis not present

## 2023-06-24 DIAGNOSIS — R202 Paresthesia of skin: Secondary | ICD-10-CM | POA: Diagnosis not present

## 2023-06-24 DIAGNOSIS — R4781 Slurred speech: Secondary | ICD-10-CM | POA: Diagnosis not present

## 2023-06-24 DIAGNOSIS — J984 Other disorders of lung: Secondary | ICD-10-CM | POA: Diagnosis not present

## 2023-07-07 ENCOUNTER — Ambulatory Visit: Payer: Medicare Other | Admitting: Allergy and Immunology

## 2023-07-14 DIAGNOSIS — R339 Retention of urine, unspecified: Secondary | ICD-10-CM | POA: Diagnosis not present

## 2023-08-02 ENCOUNTER — Encounter: Payer: Self-pay | Admitting: Allergy and Immunology

## 2023-08-02 ENCOUNTER — Ambulatory Visit (INDEPENDENT_AMBULATORY_CARE_PROVIDER_SITE_OTHER): Admitting: Allergy and Immunology

## 2023-08-02 VITALS — BP 124/82 | HR 82 | Resp 16

## 2023-08-02 DIAGNOSIS — K200 Eosinophilic esophagitis: Secondary | ICD-10-CM

## 2023-08-02 DIAGNOSIS — K2100 Gastro-esophageal reflux disease with esophagitis, without bleeding: Secondary | ICD-10-CM

## 2023-08-02 NOTE — Progress Notes (Unsigned)
 [place] - [place] - [place] - [place] - [place]   Follow-up Note  Referring Provider: Ailene Ravel, MD Primary Provider: Ailene Ravel, MD Date of Office Visit: 08/02/2023  Subjective:   Bailey Nguyen (DOB: 1945-10-10) is a 78 y.o. female who [facility]23 in re-evaluation of the following:  HPI: [name] returns to this clinic in evaluation of eosinophilic esophagitis/reflux.  I last saw her in this clinic 02 February 2023.  She is about 60% better regarding her obstructive events when eating.  And she has some nausea especially over the course of the past 4 weeks that is a pretty consistent issue even though she takes omeprazole.  Omeprazole does help with some of her classic reflux symptoms.  She continues on dupilumab injections every week and she has missed the last 2 because of an insurance issue.  Allergies as of 08/02/2023   No Known Allergies      Medication List    albuterol 108 (90 Base) MCG/ACT inhaler Commonly known as: VENTOLIN HFA Inhale 2 puffs into the lungs every 6 (six) hours as needed for wheezing or shortness of breath.   AMBULATORY NON FORMULARY MEDICATION Medication Name: GI Cocktail equal parts maalox, dicyclomine, and 2% viscous lidocaine Take 15ml every 8 hours as needed   Cequa 0.09 % Soln Generic drug: cycloSPORINE (PF) Apply 1 drop to eye 2 (two) times daily.   Dupixent 300 MG/2ML prefilled syringe Generic drug: dupilumab Inject 300 mg into the skin every 7 (seven) days.   Eliquis 5 MG Tabs tablet Generic drug: apixaban Take 5 mg by mouth 2 (two) times daily.   EPINEPHrine 0.3 mg/0.3 mL Soaj injection Commonly known as: EPI-PEN Inject 0.3 mg into the muscle as needed for anaphylaxis.   formoterol 20 MCG/2ML nebulizer solution Commonly known as: PERFOROMIST Inhale 20 mcg into the lungs 2 (two) times daily as needed (wheezing).   nateglinide 60 MG tablet Commonly known as:  STARLIX Take 60 mg by mouth 2 (two) times daily with a meal. Take 1-30 minutes before meals   nitrofurantoin (macrocrystal-monohydrate) 100 MG capsule Commonly known as: MACROBID Take 100 mg by mouth every 12 (twelve) hours.   nystatin 100000 UNIT/ML suspension Commonly known as: MYCOSTATIN Take by mouth.   omeprazole 40 MG capsule Commonly known as: PRILOSEC Take 1 capsule (40 mg total) by mouth 2 (two) times daily.   omeprazole 40 MG capsule Commonly known as: PRILOSEC Take 1 capsule (40 mg total) by mouth in the morning and at bedtime.   OXYGEN Inhale 2 L into the lungs at bedtime.   polyethylene glycol 17 g packet Commonly known as: MIRALAX / GLYCOLAX Take 17 g by mouth daily as needed for constipation.   potassium chloride 10 MEQ tablet Commonly known as: KLOR-CON Take 10 mEq by mouth daily.   pregabalin 50 MG capsule Commonly known as: LYRICA Take 50 mg by mouth 2 (two) times daily.   promethazine 25 MG tablet Commonly known as: PHENERGAN Take 12.5 mg by mouth every 8 (eight) hours as needed for nausea or vomiting.   rosuvastatin 40 MG tablet Commonly known as: CRESTOR Take 40 mg by mouth daily.   Tafluprost (PF) 0.0015 % Soln SMARTSIG:1 Drop(s) In Eye(s) Every Evening    Past Medical History:  Diagnosis Date   Anxiety    Arthritis    Back ache    Cataract    il cataracts removed   Chest wall pain  Chronic constipation    Clotting disorder (HCC)    COPD (chronic obstructive pulmonary disease) (HCC)    Coronary artery calcification seen on CT scan 01/01/2016   Diabetes mellitus without complication (HCC)    Diverticula of intestine    sigmond   Dyskinesia of esophagus    Emphysema of lung (HCC)    GERD (gastroesophageal reflux disease)    Glaucoma    History of COVID-19 05/2020   Hx of colonic polyps    Hx of Lyme disease    Hx of TIA (transient ischemic attack) and stroke    Hypercholesteremia    Lung cancer (HCC)    Removed upper right  portion of lung    Neck pain    Neuromuscular disorder (HCC)    Osteoporosis    Oxygen deficiency    Prediabetes    Splenic infarct    Stroke [facility])    TIA   Tobacco use disorder     Past Surgical History:  Procedure Laterality Date   BACK SURGERY     BIOPSY  07/09/2020   Procedure: BIOPSY;  Surgeon: Lynann Bologna, MD;  [facility];  Service: Endoscopy;;   BIOPSY  03/17/2022   Procedure: BIOPSY;  Surgeon: Lynann Bologna, MD;  Location: [facility];  Service: Gastroenterology;;   BIOPSY  10/08/2022   Procedure: BIOPSY;  Surgeon: Lynann Bologna, MD;  [facility];  Service: Gastroenterology;;   CHOLECYSTECTOMY     COLONOSCOPY  09/22/2013   Moderate predominantly sigmoid diverticulosis. Moderate internal hemorrhoids. Otherwise normal colonoscopy. Limited due to qualityt of prep/.   COLONOSCOPY WITH PROPOFOL N/A 03/17/2022   Procedure: COLONOSCOPY WITH PROPOFOL;  Surgeon: Lynann Bologna, MD;  [facility];  Service: Gastroenterology;  Laterality: N/A;   ESOPHAGOGASTRODUODENOSCOPY  03/05/2017   Peptic esophageal stricture. Gastritis.    ESOPHAGOGASTRODUODENOSCOPY (EGD) WITH PROPOFOL N/A 07/09/2020   Procedure: ESOPHAGOGASTRODUODENOSCOPY (EGD) WITH PROPOFOL;  Surgeon: Lynann Bologna, MD;  [facility];  Service: Endoscopy;  Laterality: N/A;   ESOPHAGOGASTRODUODENOSCOPY (EGD) WITH PROPOFOL N/A 03/17/2022   Procedure: ESOPHAGOGASTRODUODENOSCOPY (EGD) WITH PROPOFOL;  Surgeon: Lynann Bologna, MD;  [facility];  Service: Gastroenterology;  Laterality: N/A;   ESOPHAGOGASTRODUODENOSCOPY (EGD) WITH PROPOFOL N/A 10/08/2022   Procedure: ESOPHAGOGASTRODUODENOSCOPY (EGD) WITH PROPOFOL;  Surgeon: Lynann Bologna, MD;  [facility];  Service: Gastroenterology;  Laterality: N/A;   LUNG REMOVAL, PARTIAL Right 01/2015   LUNG SURGERY Left 07/24/2018   MALONEY DILATION  07/09/2020   Procedure: MALONEY DILATION;  Surgeon: Lynann Bologna, MD;  [facility];  Service: Endoscopy;;   [name] DILATION  03/17/2022   Procedure: [name] DILATION;  Surgeon: Lynann Bologna, MD;  Location: [facility];  Service: Gastroenterology;;   [name] DILATION  10/08/2022   Procedure: Alvy Beal;  Surgeon: Lynann Bologna, MD;  [facility];  Service: Gastroenterology;;   POLYPECTOMY  03/17/2022   Procedure: POLYPECTOMY;  Surgeon: Lynann Bologna, MD;  [facility];  Service: Gastroenterology;;   TUBAL LIGATION      Review of systems negative except as noted in HPI / PMHx or noted below:  Review of Systems  Constitutional: Negative.   HENT: Negative.    Eyes: Negative.   Respiratory: Negative.    Cardiovascular: Negative.   Gastrointestinal: Negative.   Genitourinary: Negative.   Musculoskeletal: Negative.   Skin: Negative.   Neurological: Negative.   Endo/Heme/Allergies: Negative.   Psychiatric/Behavioral: Negative.       Objective:   Vitals:   08/02/23 1346  BP: 124/82  Pulse: 82  Resp: 16  SpO2: 98%          Physical Exam Constitutional:      Appearance: She is not diaphoretic.  HENT:     Head: Normocephalic.     Right Ear: Tympanic membrane, ear canal and external ear normal.     Left Ear: Tympanic membrane, ear canal and external ear normal.     Nose: Nose normal. No mucosal edema or rhinorrhea.     Mouth/Throat:     Pharynx: Uvula midline. No oropharyngeal exudate.  Eyes:     Conjunctiva/sclera: Conjunctivae normal.  Neck:     Thyroid: No thyromegaly.     Trachea: Trachea normal. No tracheal tenderness or tracheal deviation.  Cardiovascular:     Rate and Rhythm: Normal rate and regular rhythm.     Heart sounds: Normal heart sounds, S1 normal and S2 normal. No murmur heard. Pulmonary:     Effort: No respiratory distress.     Breath sounds: Normal breath sounds. No stridor. No wheezing or rales.  Lymphadenopathy:     Head:     Right side of head: No tonsillar adenopathy.     Left side of head:  No tonsillar adenopathy.     Cervical: No cervical adenopathy.  Skin:    Findings: No erythema or rash.     Nails: There is no clubbing.  Neurological:     Mental Status: She is alert.     Diagnostics: none  Assessment and Plan:   1. Eosinophilic esophagitis   2. Gastroesophageal reflux disease with esophagitis without hemorrhage    1. Omeprazole 40 mg - 1 tablet 2 times per day  2. Dupilumab 300 mg every week   3. Revisit with Dr. Chales Abrahams for upper endoscopy. Different treatment for EOE???  4. Influenza - Tamiflu. Covid = Paxlovid  5. Further treatment???  I think we need to have Dr. Chales Abrahams perform a upper endoscopy with biopsy of Pat's esophagus to determine whether or not dupilumab is working or is not working.  If it is not working we will start her on swallowed steroids.  Until she has an appointment with Dr. Chales Abrahams he will continue on a combination of dupilumab and omeprazole.  Laurette Schimke, MD Allergy / Immunology [facility]

## 2023-08-02 NOTE — Patient Instructions (Addendum)
  1. Omeprazole 40 mg - 1 tablet 2 times per day  2. Dupilumab 300 mg every week   3. Revisit with Dr. Chales Abrahams for upper endoscopy. Different treatment for EOE???  4. Influenza - Tamiflu. Covid = Paxlovid  5. Further treatment???

## 2023-08-03 ENCOUNTER — Encounter: Payer: Self-pay | Admitting: Allergy and Immunology

## 2023-08-05 ENCOUNTER — Telehealth: Payer: Self-pay | Admitting: *Deleted

## 2023-08-05 NOTE — Telephone Encounter (Signed)
 L/m for patient she should still have refills left on Dupixent. She should check and if she has none she can reach back out to me and will send same in

## 2023-08-05 NOTE — Telephone Encounter (Signed)
-----   Message from Alexia M sent at 08/03/2023  4:31 PM EDT ----- Regarding: DUPIXENT Could you please send in a Dupixent refill for patient to [facility]?

## 2023-08-06 DIAGNOSIS — Z7901 Long term (current) use of anticoagulants: Secondary | ICD-10-CM | POA: Diagnosis not present

## 2023-08-06 DIAGNOSIS — E119 Type 2 diabetes mellitus without complications: Secondary | ICD-10-CM | POA: Diagnosis not present

## 2023-08-06 DIAGNOSIS — Z79899 Other long term (current) drug therapy: Secondary | ICD-10-CM | POA: Diagnosis not present

## 2023-08-06 DIAGNOSIS — Z87891 Personal history of nicotine dependence: Secondary | ICD-10-CM | POA: Diagnosis not present

## 2023-08-06 DIAGNOSIS — J449 Chronic obstructive pulmonary disease, unspecified: Secondary | ICD-10-CM | POA: Diagnosis not present

## 2023-08-06 DIAGNOSIS — Z789 Other specified health status: Secondary | ICD-10-CM | POA: Diagnosis not present

## 2023-08-06 DIAGNOSIS — R002 Palpitations: Secondary | ICD-10-CM | POA: Diagnosis not present

## 2023-08-06 DIAGNOSIS — K219 Gastro-esophageal reflux disease without esophagitis: Secondary | ICD-10-CM | POA: Diagnosis not present

## 2023-08-06 DIAGNOSIS — Z8673 Personal history of transient ischemic attack (TIA), and cerebral infarction without residual deficits: Secondary | ICD-10-CM | POA: Diagnosis not present

## 2023-08-06 DIAGNOSIS — R0600 Dyspnea, unspecified: Secondary | ICD-10-CM | POA: Diagnosis not present

## 2023-08-06 DIAGNOSIS — I2510 Atherosclerotic heart disease of native coronary artery without angina pectoris: Secondary | ICD-10-CM | POA: Diagnosis not present

## 2023-08-08 DIAGNOSIS — N390 Urinary tract infection, site not specified: Secondary | ICD-10-CM | POA: Diagnosis not present

## 2023-08-10 DIAGNOSIS — R300 Dysuria: Secondary | ICD-10-CM | POA: Diagnosis not present

## 2023-08-13 ENCOUNTER — Telehealth: Payer: Self-pay | Admitting: Gastroenterology

## 2023-08-13 NOTE — Telephone Encounter (Signed)
 Dr. Chales Abrahams please advise

## 2023-08-13 NOTE — Telephone Encounter (Signed)
 Patient called and stated that she is needing a refill for her GI Cocktail. Patient is requesting a call back.Please advise.

## 2023-08-14 DIAGNOSIS — J449 Chronic obstructive pulmonary disease, unspecified: Secondary | ICD-10-CM | POA: Diagnosis not present

## 2023-08-14 DIAGNOSIS — I6529 Occlusion and stenosis of unspecified carotid artery: Secondary | ICD-10-CM | POA: Diagnosis not present

## 2023-08-14 DIAGNOSIS — G4489 Other headache syndrome: Secondary | ICD-10-CM | POA: Diagnosis not present

## 2023-08-14 DIAGNOSIS — Z7901 Long term (current) use of anticoagulants: Secondary | ICD-10-CM | POA: Diagnosis not present

## 2023-08-14 DIAGNOSIS — R202 Paresthesia of skin: Secondary | ICD-10-CM | POA: Diagnosis not present

## 2023-08-14 DIAGNOSIS — R4789 Other speech disturbances: Secondary | ICD-10-CM | POA: Diagnosis not present

## 2023-08-14 DIAGNOSIS — R519 Headache, unspecified: Secondary | ICD-10-CM | POA: Diagnosis not present

## 2023-08-14 DIAGNOSIS — Z87891 Personal history of nicotine dependence: Secondary | ICD-10-CM | POA: Diagnosis not present

## 2023-08-14 DIAGNOSIS — E1165 Type 2 diabetes mellitus with hyperglycemia: Secondary | ICD-10-CM | POA: Diagnosis not present

## 2023-08-14 DIAGNOSIS — G459 Transient cerebral ischemic attack, unspecified: Secondary | ICD-10-CM | POA: Diagnosis not present

## 2023-08-14 DIAGNOSIS — E039 Hypothyroidism, unspecified: Secondary | ICD-10-CM | POA: Diagnosis not present

## 2023-08-14 DIAGNOSIS — N390 Urinary tract infection, site not specified: Secondary | ICD-10-CM | POA: Diagnosis not present

## 2023-08-14 DIAGNOSIS — Z8673 Personal history of transient ischemic attack (TIA), and cerebral infarction without residual deficits: Secondary | ICD-10-CM | POA: Diagnosis not present

## 2023-08-14 DIAGNOSIS — I6523 Occlusion and stenosis of bilateral carotid arteries: Secondary | ICD-10-CM | POA: Diagnosis not present

## 2023-08-14 DIAGNOSIS — Z79899 Other long term (current) drug therapy: Secondary | ICD-10-CM | POA: Diagnosis not present

## 2023-08-14 DIAGNOSIS — I2510 Atherosclerotic heart disease of native coronary artery without angina pectoris: Secondary | ICD-10-CM | POA: Diagnosis not present

## 2023-08-14 DIAGNOSIS — R4781 Slurred speech: Secondary | ICD-10-CM | POA: Diagnosis not present

## 2023-08-14 DIAGNOSIS — Z902 Acquired absence of lung [part of]: Secondary | ICD-10-CM | POA: Diagnosis not present

## 2023-08-14 DIAGNOSIS — E785 Hyperlipidemia, unspecified: Secondary | ICD-10-CM | POA: Diagnosis not present

## 2023-08-14 DIAGNOSIS — Z85118 Personal history of other malignant neoplasm of bronchus and lung: Secondary | ICD-10-CM | POA: Diagnosis not present

## 2023-08-14 DIAGNOSIS — E1142 Type 2 diabetes mellitus with diabetic polyneuropathy: Secondary | ICD-10-CM | POA: Diagnosis not present

## 2023-08-14 DIAGNOSIS — I959 Hypotension, unspecified: Secondary | ICD-10-CM | POA: Diagnosis not present

## 2023-08-15 DIAGNOSIS — R202 Paresthesia of skin: Secondary | ICD-10-CM | POA: Diagnosis not present

## 2023-08-16 DIAGNOSIS — R300 Dysuria: Secondary | ICD-10-CM | POA: Diagnosis not present

## 2023-08-16 DIAGNOSIS — N94819 Vulvodynia, unspecified: Secondary | ICD-10-CM | POA: Diagnosis not present

## 2023-08-16 DIAGNOSIS — N390 Urinary tract infection, site not specified: Secondary | ICD-10-CM | POA: Diagnosis not present

## 2023-08-16 NOTE — Telephone Encounter (Signed)
 OK to send in GI cocktail as before, 2RF RG

## 2023-08-17 MED ORDER — AMBULATORY NON FORMULARY MEDICATION: 2 refills | Status: AC

## 2023-08-17 NOTE — Telephone Encounter (Signed)
 Script written and will call and see where it can get filled

## 2023-08-17 NOTE — Telephone Encounter (Signed)
 Prescription sent to [facility] in [place] and patient was given address and number and made aware that insurance don't usually cover but I sent her insurance cards as well

## 2023-08-19 DIAGNOSIS — J020 Streptococcal pharyngitis: Secondary | ICD-10-CM | POA: Diagnosis not present

## 2023-08-19 DIAGNOSIS — J029 Acute pharyngitis, unspecified: Secondary | ICD-10-CM | POA: Diagnosis not present

## 2023-08-24 DIAGNOSIS — C4A8 Merkel cell carcinoma of overlapping sites: Secondary | ICD-10-CM | POA: Diagnosis not present

## 2023-09-08 DIAGNOSIS — R002 Palpitations: Secondary | ICD-10-CM | POA: Diagnosis not present

## 2023-09-14 ENCOUNTER — Ambulatory Visit: Admitting: Physician Assistant

## 2023-09-14 NOTE — Progress Notes (Deleted)
 09/14/2023 Bailey Nguyen [id number] 1945-08-12  Referring provider: Annette Barters, MD Primary GI doctor: Dr. Venice Gillis  ASSESSMENT AND PLAN:  GERD with small hiatal hernia, esophageal dysmotility Status post multiple dilations EGD 10/08/2022 torturous esophagus status post dilation gastritis complete healing of gastric ulcer seen on 03/17/2022, negative H. pylori negative malignancy  Lower abdominal pain Colonoscopy 03/17/2022 diminutive TA polyps  Merkel cell carcinoma Unknown primary followed by Dr.  Patria Bookbinder)  History of lung adenocarcinoma s/p RUL lobectomy 01/2015 Reoccurrence LLL lobe s/p wedge resection 07/2018  COPD/chronic respiratory failure with hypoxia  on home oxygen  on 2 L  CAD  History of DVT Via CT abdomen pelvis February 2023 left common femoral vein thrombosis Off Eliquis  Patient Care Team: Hamrick, Orest Bio, MD as PCP - General (Family Medicine)  HISTORY OF PRESENT ILLNESS: 78 y.o. female with a past medical history listed below presents for evaluation of dysphagia.   *** Discussed the use of AI scribe software for clinical note transcription with the patient, who gave verbal consent to proceed.  History of Present Illness            She  reports that she quit smoking about 8 years ago. Her smoking use included cigarettes. She has never used smokeless tobacco. She reports that she does not drink alcohol and does not use drugs.  RELEVANT GI HISTORY, IMAGING AND LABS: Results         EGD 10/08/2022 - Tortuous esophagus s/ o Presbyesophagus. Dilated. - Gastritis. Biopsied. Complete healing of gastric ulcer.  EGD 03/17/2022 - Presbyesophagus s/ p Dilated. - Non- bleeding gastric ulcer with no stigmata of bleeding. Biopsied.  Colonoscopy 03/17/2022 - Three 4 to 6 mm polyps in the proximal transverse colon, in the proximal ascending colon and in the mid ascending colon, removed with a cold snare. Resected and retrieved. - Moderate sigmoid  diverticulosis. - Non- bleeding internal hemorrhoids. - The examined portion of the ileum was normal. - The examination was otherwise normal on direct and retroflexion views.  PET 08/08/2021 - Largely unchanged postsurgical changes within the left inguinal region status post lymph node dissection in September 2022. Residual ill-defined soft tissue is persistently FDG avid and may reflect inflammatory/postsurgical changes versus malignancy. Continued attention on follow-up.  -Similar multiple subcentimeter moderately FDG avid mediastinal lymph nodes favored to be reactive, continued attention on follow-up.   EGD with dil 07/09/2020 -Moderate gastritis. -Presbyesophagus s/p esophageal dilatation 54Fr   CT 06/17/2021 AP with contrast 1.No acute abnormality within the abdomen or pelvis.  2.Partially occlusive thrombus of the left common femoral vein.  There is no central propagation, as the left external and common iliac veins are ligated.  3.Additional postsurgical changes in the left groin are grossly unchanged. No new lymphadenopathy.  4.Extensive dilated pelvic veins with large cross-pelvic collaterals.   CT Abdo/pelvis with contrast 05/25/2020 at [facility] -New enlarged left inguinal and left external iliac lymph nodes concerning for metastatic disease.  Otherwise no intra-abdominal abnormalities. -Diverticulosis -Small low-attenuation areas in the liver as before.   CT chest 06/07/20 No pulmonary embolism.   Interval development of extensive pulmonary infiltrate compatible  with given history of COVID-19 pneumonia predominantly throughout  the left lung. Associated shotty mediastinal and left hilar  adenopathy is reactive in nature.   Moderate to severe emphysema.   Aortic Atherosclerosis (ICD10-I70.0) and Emphysema (ICD10-J43.9).    -S/P EGDs with dil in 2013, 2016 and 2018, 2020 with good relief   Had colonoscopy 09/2013-limited due  to preparation, grossly negative except for  moderate sigmoid diverticulosis and internal hemorrhoids. Does not want repeat colonoscopy until now.   CT Chest 04/2019 1. No evidence of local tumor recurrence status post right upper lobectomy and medial left lower lobe wedge resection. 2. No findings of metastatic disease in the chest. 3. Three-vessel coronary atherosclerosis.   PET 06/2018 Abdomen/Pelvis: - Liver: Multiple subcentimeter hepatic hypodensities are unchanged from 2016 activity and exhibit no FDG uptake. Scattered parenchymal calcifications are noted. - Gallbladder: Surgically absent. - Spleen: No splenomegaly. No focal abnormalities. - Pancreas: No focal abnormality - Adrenal glands: Unremarkable - Kidneys: Unremarkable - GI Tract: Colonic diverticulosis. - GU Tract: Unremarkable - Adenopathy: None     ONCOLOGY HISTORY - 05/25/20; [facility] ED for above symptoms. CT abd/pelvis showed a new L inguinal and external iliac lymphadenopathy, the largest of which measures 4.9-cm.  -05/30/20; seen by PCP Dr. Enos Harts who noticed a mildly tender left inguinal mass midway between pubis symphysis and ASIS and was referred for biopsy  - 07/01/20; Dr. Creasie Doctor, interventional radiologist performed excisional biopsy of the left inguinal lymph node. Surgical pathology report was c/w poorly differentiated Merkel cell carcinoma [facility], [id number]).  - 07/25/20; Met with Dr. Jae Maya in surgical oncology at [facility] who recommended whole body PET/CT scan.  -07/29/20; PET/CT whole body was significant for a new FDG-avid 0.4cm L apex nodule. Stable, scattered, mildly FDG-avid mediastinal lymph nodes. Bulky FDG-avid L external iliac chain 2.2-cm lymph node. Smaller 0.8-cm FDG-avid L external iliac chain lymph node. An up to 3.7-cm FDG-avid L inguinal lymph nodes - 08/15/20; first avelumab  infusion  -09/06/20; second avelumab  infusion  -09/27/20; third avelumab  infusion -12/23/20; rechallenge with a single  avelumab  infusion (5th infusion). -02/03/21; left femoroinguinal lymph node dissection; pathology c/w metastatic for merkel cell carcinoma 6.5-cm in greatest dimension in 1/9 lymph node ([id number]). CBC    Component Value Date/Time   WBC 7.0 10/27/2020 2218   RBC 4.82 10/27/2020 2218   HGB 14.6 10/08/2022 0722   HCT 43.0 10/08/2022 0722   PLT 335 10/27/2020 2218   MCV 90.7 10/27/2020 2218   MCH 29.3 10/27/2020 2218   MCHC 32.3 10/27/2020 2218   RDW 12.9 10/27/2020 2218   Recent Labs    10/08/22 0722  HGB 14.6    CMP     Component Value Date/Time   NA 136 10/08/2022 0722   NA 142 08/29/2019 0947   K 3.9 10/08/2022 0722   CL 103 10/08/2022 0722   CO2 20 (L) 10/27/2020 2218   GLUCOSE 120 (H) 10/08/2022 0722   BUN 10 10/08/2022 0722   BUN 14 08/29/2019 0947   CREATININE 0.60 10/08/2022 0722   CALCIUM 9.7 10/27/2020 2218   PROT 7.4 08/29/2019 0947   ALBUMIN 4.6 08/29/2019 0947   AST 13 08/29/2019 0947   ALT 13 08/29/2019 0947   ALKPHOS 88 08/29/2019 0947   BILITOT 0.2 08/29/2019 0947   GFRNONAA >60 10/27/2020 2218   GFRAA 68 08/29/2019 0947      Latest Ref Rng & Units 08/29/2019    9:47 AM  Hepatic Function  Total Protein 6.0 - 8.5 g/dL 7.4   Albumin 3.7 - 4.7 g/dL 4.6   AST 0 - 40 IU/L 13   ALT 0 - 32 IU/L 13   Alk Phosphatase 39 - 117 IU/L 88   Total Bilirubin 0.0 - 1.2 mg/dL 0.2       Current Medications:   Current Outpatient Medications (Endocrine &  Metabolic):    nateglinide (STARLIX) 60 MG tablet, Take 60 mg by mouth 2 (two) times daily with a meal. Take 1-30 minutes before meals  Current Outpatient Medications (Cardiovascular):    EPINEPHrine  0.3 mg/0.3 mL IJ SOAJ injection, Inject 0.3 mg into the muscle as needed for anaphylaxis.   rosuvastatin (CRESTOR) 40 MG tablet, Take 40 mg by mouth daily.  Current Outpatient Medications (Respiratory):    albuterol  (VENTOLIN  HFA) 108 (90 Base) MCG/ACT inhaler, Inhale 2 puffs into the lungs every 6 (six) hours  as needed for wheezing or shortness of breath.   formoterol (PERFOROMIST) 20 MCG/2ML nebulizer solution, Inhale 20 mcg into the lungs 2 (two) times daily as needed (wheezing).   promethazine  (PHENERGAN ) 25 MG tablet, Take 12.5 mg by mouth every 8 (eight) hours as needed for nausea or vomiting.   Current Outpatient Medications (Hematological):    apixaban (ELIQUIS) 5 MG TABS tablet, Take 5 mg by mouth 2 (two) times daily.  Current Outpatient Medications (Other):    AMBULATORY NON FORMULARY MEDICATION, Medication Name: GI Cocktail equal parts maalox, dicyclomine, and 2% viscous lidocaine  Take 15ml every 8 hours as needed   AMBULATORY NON FORMULARY MEDICATION, Medication Name: GI Cocktail equal parts maalox, dicyclomine, and 2% viscous lidocaine   Take 15ml every 8 hours as needed   CEQUA 0.09 % SOLN, Apply 1 drop to eye 2 (two) times daily.   dupilumab  (DUPIXENT ) 300 MG/2ML prefilled syringe, Inject 300 mg into the skin every 7 (seven) days.   nitrofurantoin, macrocrystal-monohydrate, (MACROBID) 100 MG capsule, Take 100 mg by mouth every 12 (twelve) hours.   nystatin (MYCOSTATIN) 100000 UNIT/ML suspension, Take by mouth.   omeprazole  (PRILOSEC) 40 MG capsule, Take 1 capsule (40 mg total) by mouth 2 (two) times daily.   omeprazole  (PRILOSEC) 40 MG capsule, Take 1 capsule (40 mg total) by mouth in the morning and at bedtime.   OXYGEN , Inhale 2 L into the lungs at bedtime.   polyethylene glycol (MIRALAX / GLYCOLAX) packet, Take 17 g by mouth daily as needed for constipation.   potassium chloride  (KLOR-CON ) 10 MEQ tablet, Take 10 mEq by mouth daily.   pregabalin (LYRICA) 50 MG capsule, Take 50 mg by mouth 2 (two) times daily.   Tafluprost, PF, 0.0015 % SOLN, SMARTSIG:1 Drop(s) In Eye(s) Every Evening  Medical History:  Past Medical History:  Diagnosis Date   Anxiety    Arthritis    Back ache    Cataract    il cataracts removed   Chest wall pain    Chronic constipation    Clotting disorder  (HCC)    COPD (chronic obstructive pulmonary disease) (HCC)    Coronary artery calcification seen on CT scan 01/01/2016   Diabetes mellitus without complication (HCC)    Diverticula of intestine    sig[place]   Dyskinesia of esophagus    Emphysema of lung (HCC)    GERD (gastroesophageal reflux disease)    Glaucoma    History of COVID-19 05/2020   Hx of colonic polyps    Hx of Lyme disease    Hx of TIA (transient ischemic attack) and stroke    Hypercholesteremia    Lung cancer (HCC)    Removed upper right portion of lung    Neck pain    Neuromuscular disorder (HCC)    Osteoporosis    Oxygen  deficiency    Prediabetes    Splenic infarct    Stroke [facility])    TIA   Tobacco use disorder  Allergies: No Known Allergies   Surgical History:  She  has a past surgical history that includes Cholecystectomy; Back surgery; Tubal ligation; Lung removal, partial (Right, 01/2015); Esophagogastroduodenoscopy (03/05/2017); Colonoscopy (09/22/2013); Lung surgery (Left, 07/24/2018); Esophagogastroduodenoscopy (egd) with propofol  (N/A, 07/09/2020); biopsy (07/09/2020); maloney dilation (07/09/2020); Colonoscopy with propofol  (N/A, 03/17/2022); Esophagogastroduodenoscopy (egd) with propofol  (N/A, 03/17/2022); maloney dilation (03/17/2022); biopsy (03/17/2022); polypectomy (03/17/2022); Esophagogastroduodenoscopy (egd) with propofol  (N/A, 10/08/2022); maloney dilation (10/08/2022); and biopsy (10/08/2022). Family History:  Her family history includes Coronary artery disease (age of onset: 29) in her mother; Diabetes in her brother, father, and sister; Lung cancer in her brother; Pancreatic cancer in her father; Thyroid  cancer in her daughter.  REVIEW OF SYSTEMS  : All other systems reviewed and negative except where noted in the History of Present Illness.  PHYSICAL EXAM: There were no vitals taken for this visit. Physical Exam          Edmonia Gottron, PA-C 7:52 AM

## 2023-09-22 DIAGNOSIS — R300 Dysuria: Secondary | ICD-10-CM | POA: Diagnosis not present

## 2023-09-22 DIAGNOSIS — R109 Unspecified abdominal pain: Secondary | ICD-10-CM | POA: Diagnosis not present

## 2023-09-22 DIAGNOSIS — N390 Urinary tract infection, site not specified: Secondary | ICD-10-CM | POA: Diagnosis not present

## 2023-10-05 ENCOUNTER — Telehealth: Payer: Self-pay | Admitting: Gastroenterology

## 2023-10-05 NOTE — Telephone Encounter (Signed)
 Pt stated that she has been having difficulty swallowing recently and was wondering if there is anything that she could be doing prior to her office visit that was scheduled to see Santina Cull PA on 10/28/2023 at 2:30 PM. Pt was made aware that I would send her some very good information via her my chart for difficulty swallowing.  Pt verbalized understanding with all questions answered.

## 2023-10-05 NOTE — Telephone Encounter (Signed)
 PT Is calling to get some information to be proactive prior to EGD. Requesting to speak with nurse.

## 2023-10-06 ENCOUNTER — Telehealth: Payer: Self-pay | Admitting: Allergy and Immunology

## 2023-10-06 NOTE — Telephone Encounter (Signed)
 Patient states she is having a really hard time eating and drinking. She feels that everything gets stuck and is afraid of choking. She has an appointment with [facility] on 6/19 and wants to know if we can call GI and see if we can get her in sooner.

## 2023-10-07 DIAGNOSIS — I2510 Atherosclerotic heart disease of native coronary artery without angina pectoris: Secondary | ICD-10-CM | POA: Diagnosis not present

## 2023-10-07 DIAGNOSIS — E876 Hypokalemia: Secondary | ICD-10-CM | POA: Diagnosis not present

## 2023-10-07 DIAGNOSIS — E1142 Type 2 diabetes mellitus with diabetic polyneuropathy: Secondary | ICD-10-CM | POA: Diagnosis not present

## 2023-10-07 DIAGNOSIS — I639 Cerebral infarction, unspecified: Secondary | ICD-10-CM | POA: Diagnosis not present

## 2023-10-07 DIAGNOSIS — R1030 Lower abdominal pain, unspecified: Secondary | ICD-10-CM | POA: Diagnosis not present

## 2023-10-07 DIAGNOSIS — F419 Anxiety disorder, unspecified: Secondary | ICD-10-CM | POA: Diagnosis not present

## 2023-10-07 DIAGNOSIS — E1165 Type 2 diabetes mellitus with hyperglycemia: Secondary | ICD-10-CM | POA: Diagnosis not present

## 2023-10-07 DIAGNOSIS — J449 Chronic obstructive pulmonary disease, unspecified: Secondary | ICD-10-CM | POA: Diagnosis not present

## 2023-10-07 DIAGNOSIS — N3001 Acute cystitis with hematuria: Secondary | ICD-10-CM | POA: Diagnosis not present

## 2023-10-07 DIAGNOSIS — K219 Gastro-esophageal reflux disease without esophagitis: Secondary | ICD-10-CM | POA: Diagnosis not present

## 2023-10-07 DIAGNOSIS — E785 Hyperlipidemia, unspecified: Secondary | ICD-10-CM | POA: Diagnosis not present

## 2023-10-07 DIAGNOSIS — N201 Calculus of ureter: Secondary | ICD-10-CM | POA: Diagnosis not present

## 2023-10-07 DIAGNOSIS — Z79899 Other long term (current) drug therapy: Secondary | ICD-10-CM | POA: Diagnosis not present

## 2023-10-07 DIAGNOSIS — E039 Hypothyroidism, unspecified: Secondary | ICD-10-CM | POA: Diagnosis not present

## 2023-10-07 DIAGNOSIS — Z87891 Personal history of nicotine dependence: Secondary | ICD-10-CM | POA: Diagnosis not present

## 2023-10-07 DIAGNOSIS — I10 Essential (primary) hypertension: Secondary | ICD-10-CM | POA: Diagnosis not present

## 2023-10-07 DIAGNOSIS — Z7901 Long term (current) use of anticoagulants: Secondary | ICD-10-CM | POA: Diagnosis not present

## 2023-10-07 DIAGNOSIS — G8929 Other chronic pain: Secondary | ICD-10-CM | POA: Diagnosis not present

## 2023-10-07 NOTE — Telephone Encounter (Signed)
 Called [facility] and the 10-28-23 appointment is the first opening they have right now.  I called and informed patient. I did let her know that someone from [facility] had sent her some information regarding difficulty swallowing via her MyChart.  I encouraged her to to check that information out.    She did say that she does not think the Dupixent  is helping.  She wants to know if she can increase the frequency to every five days. Please advise ( she is ok waiting until Dr. Zenia Hight return.)

## 2023-10-07 NOTE — Telephone Encounter (Signed)
 Please let Bailey Nguyen know that we will try, but I am not sure if we will have much luck.

## 2023-10-10 DIAGNOSIS — N3001 Acute cystitis with hematuria: Secondary | ICD-10-CM | POA: Diagnosis not present

## 2023-10-13 MED ORDER — BUDESONIDE 0.5 MG/2ML IN SUSP: RESPIRATORY_TRACT | 1 refills | Status: AC

## 2023-10-13 NOTE — Telephone Encounter (Signed)
 Called and informed patient of Dr. Zenia Hight message. Sent ERX to [facility] by Mail.

## 2023-10-13 NOTE — Addendum Note (Signed)
 Addended by: Breniyah Romm K on: 10/13/2023 12:13 PM   Modules accepted: Orders

## 2023-10-14 DIAGNOSIS — R339 Retention of urine, unspecified: Secondary | ICD-10-CM | POA: Diagnosis not present

## 2023-10-14 DIAGNOSIS — K219 Gastro-esophageal reflux disease without esophagitis: Secondary | ICD-10-CM | POA: Diagnosis not present

## 2023-10-14 DIAGNOSIS — Z7901 Long term (current) use of anticoagulants: Secondary | ICD-10-CM | POA: Diagnosis not present

## 2023-10-14 DIAGNOSIS — N3281 Overactive bladder: Secondary | ICD-10-CM | POA: Diagnosis not present

## 2023-10-14 DIAGNOSIS — E119 Type 2 diabetes mellitus without complications: Secondary | ICD-10-CM | POA: Diagnosis not present

## 2023-10-14 DIAGNOSIS — R3915 Urgency of urination: Secondary | ICD-10-CM | POA: Diagnosis not present

## 2023-10-14 DIAGNOSIS — N390 Urinary tract infection, site not specified: Secondary | ICD-10-CM | POA: Diagnosis not present

## 2023-10-14 DIAGNOSIS — Z87891 Personal history of nicotine dependence: Secondary | ICD-10-CM | POA: Diagnosis not present

## 2023-10-14 DIAGNOSIS — Z8673 Personal history of transient ischemic attack (TIA), and cerebral infarction without residual deficits: Secondary | ICD-10-CM | POA: Diagnosis not present

## 2023-10-14 DIAGNOSIS — N952 Postmenopausal atrophic vaginitis: Secondary | ICD-10-CM | POA: Diagnosis not present

## 2023-10-14 DIAGNOSIS — I2510 Atherosclerotic heart disease of native coronary artery without angina pectoris: Secondary | ICD-10-CM | POA: Diagnosis not present

## 2023-10-14 DIAGNOSIS — R338 Other retention of urine: Secondary | ICD-10-CM | POA: Diagnosis not present

## 2023-10-14 DIAGNOSIS — J449 Chronic obstructive pulmonary disease, unspecified: Secondary | ICD-10-CM | POA: Diagnosis not present

## 2023-10-14 DIAGNOSIS — Z833 Family history of diabetes mellitus: Secondary | ICD-10-CM | POA: Diagnosis not present

## 2023-10-14 DIAGNOSIS — Z79899 Other long term (current) drug therapy: Secondary | ICD-10-CM | POA: Diagnosis not present

## 2023-10-28 ENCOUNTER — Ambulatory Visit: Admitting: Physician Assistant

## 2023-11-13 DIAGNOSIS — R300 Dysuria: Secondary | ICD-10-CM | POA: Diagnosis not present

## 2023-11-23 DIAGNOSIS — Z1212 Encounter for screening for malignant neoplasm of rectum: Secondary | ICD-10-CM | POA: Diagnosis not present

## 2023-11-23 DIAGNOSIS — Z831 Family history of other infectious and parasitic diseases: Secondary | ICD-10-CM | POA: Diagnosis not present

## 2023-11-23 DIAGNOSIS — R140 Abdominal distension (gaseous): Secondary | ICD-10-CM | POA: Diagnosis not present

## 2023-11-23 DIAGNOSIS — R110 Nausea: Secondary | ICD-10-CM | POA: Diagnosis not present

## 2023-11-24 DIAGNOSIS — R140 Abdominal distension (gaseous): Secondary | ICD-10-CM | POA: Diagnosis not present

## 2023-11-28 DIAGNOSIS — M129 Arthropathy, unspecified: Secondary | ICD-10-CM | POA: Diagnosis not present

## 2023-11-28 DIAGNOSIS — G8929 Other chronic pain: Secondary | ICD-10-CM | POA: Diagnosis not present

## 2023-11-28 DIAGNOSIS — M545 Low back pain, unspecified: Secondary | ICD-10-CM | POA: Diagnosis not present

## 2023-11-28 DIAGNOSIS — G5793 Unspecified mononeuropathy of bilateral lower limbs: Secondary | ICD-10-CM | POA: Diagnosis not present

## 2023-11-28 DIAGNOSIS — Z6823 Body mass index (BMI) 23.0-23.9, adult: Secondary | ICD-10-CM | POA: Diagnosis not present

## 2023-11-28 DIAGNOSIS — Z79899 Other long term (current) drug therapy: Secondary | ICD-10-CM | POA: Diagnosis not present

## 2023-11-29 ENCOUNTER — Telehealth: Payer: Self-pay | Admitting: Gastroenterology

## 2023-11-29 NOTE — Telephone Encounter (Signed)
 Inbound call from patient, states she has been on promethazine  and Calcium and that those medications can cause stomach ulcers. Patient states she has been bleeding rectally and is unsure if she can get a sooner appointment. She is also unsure if she can continue taking both medications or if she should cease taking them due to the blood in her stool. Please advise.

## 2023-11-30 NOTE — Telephone Encounter (Signed)
 Pt stated that for over a month that she has been having a lower abdominal pain ( burning sensation) that going to her groin and rectum. Pt stated that her BM are OK. Last BM was yesterday.  Pt requested a sooner appointment to see Dr. Charlanne. Pt to be scheduled for 12/08/2023 at 11:00 AM with Dr. Charlanne. Pt made aware. 7 day hold.  Pt verbalized understanding with all questions answered.

## 2023-12-01 ENCOUNTER — Emergency Department (HOSPITAL_COMMUNITY)
Admission: EM | Admit: 2023-12-01 | Discharge: 2023-12-01 | Discharge status: Against Medical Advice | Attending: Emergency Medicine | Admitting: Emergency Medicine

## 2023-12-01 ENCOUNTER — Other Ambulatory Visit: Payer: Self-pay

## 2023-12-01 ENCOUNTER — Encounter (HOSPITAL_COMMUNITY): Payer: Self-pay

## 2023-12-01 DIAGNOSIS — Z5321 Procedure and treatment not carried out due to patient leaving prior to being seen by health care provider: Secondary | ICD-10-CM | POA: Insufficient documentation

## 2023-12-01 DIAGNOSIS — R1030 Lower abdominal pain, unspecified: Secondary | ICD-10-CM | POA: Insufficient documentation

## 2023-12-01 LAB — CBC
HCT: 41.2 % (ref 36.0–46.0)
Hemoglobin: 13.6 g/dL (ref 12.0–15.0)
MCH: 30.4 pg (ref 26.0–34.0)
MCHC: 33 g/dL (ref 30.0–36.0)
MCV: 92 fL (ref 80.0–100.0)
Platelets: 307 K/uL (ref 150–400)
RBC: 4.48 MIL/uL (ref 3.87–5.11)
RDW: 13.2 % (ref 11.5–15.5)
WBC: 6.1 K/uL (ref 4.0–10.5)
nRBC: 0 % (ref 0.0–0.2)

## 2023-12-01 LAB — COMPREHENSIVE METABOLIC PANEL WITH GFR
ALT: 10 U/L (ref 0–44)
AST: 16 U/L (ref 15–41)
Albumin: 3.6 g/dL (ref 3.5–5.0)
Alkaline Phosphatase: 89 U/L (ref 38–126)
Anion gap: 13 (ref 5–15)
BUN: 10 mg/dL (ref 8–23)
CO2: 21 mmol/L — ABNORMAL LOW (ref 22–32)
Calcium: 9.1 mg/dL (ref 8.9–10.3)
Chloride: 103 mmol/L (ref 98–111)
Creatinine, Ser: 0.91 mg/dL (ref 0.44–1.00)
GFR, Estimated: >60 mL/min (ref >60)
Glucose, Bld: 144 mg/dL — ABNORMAL HIGH (ref 70–99)
Potassium: 3.3 mmol/L — ABNORMAL LOW (ref 3.5–5.1)
Sodium: 137 mmol/L (ref 135–145)
Total Bilirubin: 0.7 mg/dL (ref 0.0–1.2)
Total Protein: 7.3 g/dL (ref 6.5–8.1)

## 2023-12-01 LAB — LIPASE, BLOOD: Lipase: 35 U/L (ref 11–51)

## 2023-12-01 NOTE — ED Notes (Signed)
 Pt states she has always had issues with her BM. She has to take medication to help her go.

## 2023-12-01 NOTE — Telephone Encounter (Signed)
 Pt scheduled  for 12/08/2023 at 11:00 AM with Dr. Charlanne.

## 2023-12-01 NOTE — ED Triage Notes (Signed)
 Pt bib POV c/o abdominal pain. Pt says it started at the end of May. Pt hurts in her lower abdomen, vagina, groin and rectum. The pain is shooting and burning.   Pt denies burning during urination but she uses I/O catheters.   Pt stated she had a positive stool sample for blood. Pt states her stool is dark but not black. She was given the results yesterday and informed to f/u with GI.  Pt has been taking 5 mg Oxycodone HCL with minimum relief.

## 2023-12-01 NOTE — ED Notes (Signed)
 Patient decided to leave even after I tried to encourage her to stay. She said she would be back in the morning.

## 2023-12-07 ENCOUNTER — Emergency Department (HOSPITAL_COMMUNITY)

## 2023-12-07 ENCOUNTER — Encounter (HOSPITAL_COMMUNITY): Payer: Self-pay

## 2023-12-07 ENCOUNTER — Emergency Department (HOSPITAL_COMMUNITY)
Admission: EM | Admit: 2023-12-07 | Discharge: 2023-12-07 | Disposition: A | Discharge status: Home / Self Care | Attending: Emergency Medicine | Admitting: Emergency Medicine

## 2023-12-07 DIAGNOSIS — Z7901 Long term (current) use of anticoagulants: Secondary | ICD-10-CM | POA: Diagnosis not present

## 2023-12-07 DIAGNOSIS — Z86718 Personal history of other venous thrombosis and embolism: Secondary | ICD-10-CM | POA: Insufficient documentation

## 2023-12-07 DIAGNOSIS — R0602 Shortness of breath: Secondary | ICD-10-CM | POA: Diagnosis not present

## 2023-12-07 DIAGNOSIS — R110 Nausea: Secondary | ICD-10-CM | POA: Insufficient documentation

## 2023-12-07 DIAGNOSIS — Z85118 Personal history of other malignant neoplasm of bronchus and lung: Secondary | ICD-10-CM | POA: Diagnosis not present

## 2023-12-07 DIAGNOSIS — R0609 Other forms of dyspnea: Secondary | ICD-10-CM | POA: Insufficient documentation

## 2023-12-07 DIAGNOSIS — E876 Hypokalemia: Secondary | ICD-10-CM | POA: Insufficient documentation

## 2023-12-07 DIAGNOSIS — Z7951 Long term (current) use of inhaled steroids: Secondary | ICD-10-CM | POA: Insufficient documentation

## 2023-12-07 DIAGNOSIS — I10 Essential (primary) hypertension: Secondary | ICD-10-CM | POA: Diagnosis not present

## 2023-12-07 DIAGNOSIS — R109 Unspecified abdominal pain: Secondary | ICD-10-CM | POA: Diagnosis not present

## 2023-12-07 DIAGNOSIS — R1013 Epigastric pain: Secondary | ICD-10-CM

## 2023-12-07 DIAGNOSIS — I700 Atherosclerosis of aorta: Secondary | ICD-10-CM | POA: Diagnosis not present

## 2023-12-07 DIAGNOSIS — R000 Tachycardia, unspecified: Secondary | ICD-10-CM | POA: Insufficient documentation

## 2023-12-07 DIAGNOSIS — J449 Chronic obstructive pulmonary disease, unspecified: Secondary | ICD-10-CM | POA: Diagnosis not present

## 2023-12-07 DIAGNOSIS — R918 Other nonspecific abnormal finding of lung field: Secondary | ICD-10-CM | POA: Diagnosis not present

## 2023-12-07 LAB — CBC WITH DIFFERENTIAL/PLATELET
Abs Immature Granulocytes: 0.02 K/uL (ref 0.00–0.07)
Basophils Absolute: 0 K/uL (ref 0.0–0.1)
Basophils Relative: 1 %
Eosinophils Absolute: 0 K/uL (ref 0.0–0.5)
Eosinophils Relative: 1 %
HCT: 41.6 % (ref 36.0–46.0)
Hemoglobin: 14 g/dL (ref 12.0–15.0)
Immature Granulocytes: 0 %
Lymphocytes Relative: 19 %
Lymphs Abs: 1 K/uL (ref 0.7–4.0)
MCH: 30.4 pg (ref 26.0–34.0)
MCHC: 33.7 g/dL (ref 30.0–36.0)
MCV: 90.2 fL (ref 80.0–100.0)
Monocytes Absolute: 0.6 K/uL (ref 0.1–1.0)
Monocytes Relative: 12 %
Neutro Abs: 3.6 K/uL (ref 1.7–7.7)
Neutrophils Relative %: 67 %
Platelets: 358 K/uL (ref 150–400)
RBC: 4.61 MIL/uL (ref 3.87–5.11)
RDW: 13.2 % (ref 11.5–15.5)
WBC: 5.3 K/uL (ref 4.0–10.5)
nRBC: 0 % (ref 0.0–0.2)

## 2023-12-07 LAB — URINALYSIS, ROUTINE W REFLEX MICROSCOPIC
Bilirubin Urine: NEGATIVE
Glucose, UA: NEGATIVE mg/dL
Hgb urine dipstick: NEGATIVE
Ketones, ur: NEGATIVE mg/dL
Leukocytes,Ua: NEGATIVE
Nitrite: NEGATIVE
Protein, ur: 30 mg/dL — AB
Specific Gravity, Urine: 1.016 (ref 1.005–1.030)
pH: 7 (ref 5.0–8.0)

## 2023-12-07 LAB — COMPREHENSIVE METABOLIC PANEL WITH GFR
ALT: 11 U/L (ref 0–44)
AST: 17 U/L (ref 15–41)
Albumin: 3.8 g/dL (ref 3.5–5.0)
Alkaline Phosphatase: 101 U/L (ref 38–126)
Anion gap: 13 (ref 5–15)
BUN: 10 mg/dL (ref 8–23)
CO2: 24 mmol/L (ref 22–32)
Calcium: 9.3 mg/dL (ref 8.9–10.3)
Chloride: 101 mmol/L (ref 98–111)
Creatinine, Ser: 0.99 mg/dL (ref 0.44–1.00)
GFR, Estimated: 59 mL/min — ABNORMAL LOW (ref >60)
Glucose, Bld: 132 mg/dL — ABNORMAL HIGH (ref 70–99)
Potassium: 3.1 mmol/L — ABNORMAL LOW (ref 3.5–5.1)
Sodium: 138 mmol/L (ref 135–145)
Total Bilirubin: 0.8 mg/dL (ref 0.0–1.2)
Total Protein: 7.7 g/dL (ref 6.5–8.1)

## 2023-12-07 LAB — RESP PANEL BY RT-PCR (RSV, FLU A&B, COVID)  RVPGX2
Influenza A by PCR: NEGATIVE
Influenza B by PCR: NEGATIVE
Resp Syncytial Virus by PCR: NEGATIVE
SARS Coronavirus 2 by RT PCR: NEGATIVE

## 2023-12-07 LAB — BRAIN NATRIURETIC PEPTIDE: B Natriuretic Peptide: 34.6 pg/mL (ref 0.0–100.0)

## 2023-12-07 LAB — TROPONIN I (HIGH SENSITIVITY)
Troponin I (High Sensitivity): 9 ng/L (ref <18)
Troponin I (High Sensitivity): 8 ng/L (ref <18)

## 2023-12-07 LAB — LIPASE, BLOOD: Lipase: 35 U/L (ref 11–51)

## 2023-12-07 MED ORDER — OXYCODONE-ACETAMINOPHEN 5-325 MG PO TABS
1.0000 | ORAL_TABLET | Freq: Once | ORAL | Status: AC
  Administered 2023-12-07: 1 via ORAL
  Filled 2023-12-07: qty 1

## 2023-12-07 MED ORDER — OXYCODONE-ACETAMINOPHEN 5-325 MG PO TABS: 1.0000 | ORAL_TABLET | Freq: Four times a day (QID) | ORAL | 0 refills | Status: AC | PRN

## 2023-12-07 MED ORDER — IOHEXOL 350 MG/ML SOLN
75.0000 mL | Freq: Once | INTRAVENOUS | Status: AC | PRN
  Administered 2023-12-07: 75 mL via INTRAVENOUS

## 2023-12-07 MED ORDER — POTASSIUM CHLORIDE CRYS ER 20 MEQ PO TBCR
40.0000 meq | EXTENDED_RELEASE_TABLET | Freq: Once | ORAL | Status: AC
  Administered 2023-12-07: 40 meq via ORAL
  Filled 2023-12-07: qty 2

## 2023-12-07 NOTE — ED Provider Notes (Signed)
 Accepted handoff at shift change from [place], [place]. Please see prior provider note for more detail.   Briefly: Patient is 78 y.o.   DDX: concern for constipation, COPD exacerbation, malignancy, fluid overload, bowel obstruction  Plan: Disposition per results of CT imaging  Physical Exam  BP 138/81   Pulse 92   Temp 98.1 F (36.7 C) (Oral)   Resp 19   Ht 5' 5 (1.651 m)   Wt 63.5 kg   SpO2 99%   BMI 23.30 kg/m   Physical Exam  Procedures  Procedures  ED Course / MDM    Medical Decision Making Amount and/or Complexity of Data Reviewed Labs: ordered. Radiology: ordered.  Risk Prescription drug management.   Patient handed off from [place], [place].  Please see her note for full HPI and physical exam findings.  In brief, patient here with concerns fo abdominal pain and chest discomfort. Describes abdominal pain as a burning sensation. Prior DVT and currently on Eliquis. Plan at this time is disposition per results of CT imaging. She is currently stable.  CT imaging is negative.  No evidence of PE or any obvious abdominal abnormality to explain her current symptoms.  Repeat troponin is flat.  Doubtful of ACS.  Will discharge home with instructions to follow-up with pulmonology regarding her COPD as well as GI regarding her abdominal discomfort.  Return precautions discussed such as concerns for new or worsening symptoms.  A small refill of her oxycodone  has been sent to her pharmacy.  Return precautions discussed such as concerns for new or worsening symptoms.  Discharged home in stable condition.         Kimmarie Pascale A, PA-C 12/07/23 1752    Neysa Caron PARAS, DO 12/07/23 [phone]

## 2023-12-07 NOTE — Discharge Instructions (Addendum)
 You were seen in the emergency department today for concerns of abdominal discomfort and difficulty breathing.  Your labs and imaging were thankfully reassuring.  I do suspect your symptoms may be due to increasing difficulty with your shortness of breath from COPD.  Please reach out to your pulmonologist regarding this.  Your abdominal pain may be due to some level of constipation but this is not clear as her CT scan was rather normal.  Please follow-up with your gastroenterologist for further assessment.  Return to the emergency department for any concerns of new or worsening symptoms.

## 2023-12-07 NOTE — ED Triage Notes (Signed)
 Pt bib EMS for 8/10 lower abdominal pain. Pt complains of a burning sensation. States it has been going on for a few months but got significantly worse today. Was seen last week for same. Hx of blood in stool. Hx of lung CA and is on 2L [place] at baseline. Hx of DM. Pt is A+Ox4, and groaning in pain.  Last had 5mg  oxycodone  last night for pain.

## 2023-12-07 NOTE — ED Notes (Signed)
 Patient transported to CT

## 2023-12-07 NOTE — ED Provider Notes (Signed)
 [place] [facility] AT [facility] Provider Note   CSN: [id number] Arrival date & time: 12/07/23  1053     Patient presents with: Abdominal Pain   Bailey Nguyen is a 78 y.o. female.   78 year old female presenting with abdominal pain.  Patient describes daily bouts of burning pain in her lower abdomen that radiates to her rectum, she takes MiraLAX most days as she is chronically constipated, she took a laxative on Sunday and subsequently had a loose bowel movement which she describes as burnt orange, like blood in color, she has been told by her gastroenterologist in the past that she had blood noted in her stool, it is unclear how long ago this was.  She also notes several days of worsening shortness of breath on exertion, she does wear oxygen  at home as needed but has relied on her oxygen  throughout the day for the past several days, she is on 2 L [place].  She reports that she is supposed to take azithromycin daily, admits she may take this just a few times a week.  She endorses nausea but no vomiting, denies chest pain.  History of COPD, history of left lower extremity DVT on Eliquis.   Abdominal Pain      Prior to Admission medications   Medication Sig Start Date End Date Taking? Authorizing Provider  albuterol  (VENTOLIN  HFA) 108 (90 Base) MCG/ACT inhaler Inhale 2 puffs into the lungs every 6 (six) hours as needed for wheezing or shortness of breath.    [provider]  AMBULATORY NON FORMULARY MEDICATION Medication Name: GI Cocktail equal parts maalox, dicyclomine, and 2% viscous lidocaine  Take 15ml every 8 hours as needed 08/31/22   Charlanne Groom, MD  AMBULATORY NON FORMULARY MEDICATION Medication Name: GI Cocktail equal parts maalox, dicyclomine, and 2% viscous lidocaine   Take 15ml every 8 hours as needed 08/17/23   Charlanne Groom, MD  apixaban (ELIQUIS) 5 MG TABS tablet Take 5 mg by mouth 2 (two) times daily.    [provider]  budesonide   (PULMICORT ) 0.5 MG/2ML nebulizer solution Mix the contents of one vial with one teaspoon of honey and swallow twice daily. Do not eat or drink for 10-15 minutes afterward. 10/13/23   Kozlow, Camellia PARAS, MD  CEQUA 0.09 % SOLN Apply 1 drop to eye 2 (two) times daily. 09/10/22   [provider]  dupilumab  (DUPIXENT ) 300 MG/2ML prefilled syringe Inject 300 mg into the skin every 7 (seven) days. 12/16/22   Kozlow, Camellia PARAS, MD  EPINEPHrine  0.3 mg/0.3 mL IJ SOAJ injection Inject 0.3 mg into the muscle as needed for anaphylaxis. 02/21/20   [provider]  formoterol (PERFOROMIST) 20 MCG/2ML nebulizer solution Inhale 20 mcg into the lungs 2 (two) times daily as needed (wheezing). 03/14/21   [provider]  nateglinide (STARLIX) 60 MG tablet Take 60 mg by mouth 2 (two) times daily with a meal. Take 1-30 minutes before meals 10/28/21   [provider]  nitrofurantoin, macrocrystal-monohydrate, (MACROBID) 100 MG capsule Take 100 mg by mouth every 12 (twelve) hours. 01/31/23   [provider]  nystatin (MYCOSTATIN) 100000 UNIT/ML suspension Take by mouth. 09/24/22   [provider]  omeprazole  (PRILOSEC) 40 MG capsule Take 1 capsule (40 mg total) by mouth 2 (two) times daily. 12/04/22   Charlanne Groom, MD  omeprazole  (PRILOSEC) 40 MG capsule Take 1 capsule (40 mg total) by mouth in the morning and at bedtime. 02/02/23   Kozlow, Camellia PARAS, MD  OXYGEN   Inhale 2 L into the lungs at bedtime.    [provider]  polyethylene glycol (MIRALAX / GLYCOLAX) packet Take 17 g by mouth daily as needed for constipation. 02/08/15   [provider]  potassium chloride  (KLOR-CON ) 10 MEQ tablet Take 10 mEq by mouth daily.    [provider]  pregabalin (LYRICA) 50 MG capsule Take 50 mg by mouth 2 (two) times daily.    [provider]  promethazine  (PHENERGAN ) 25 MG tablet Take 12.5 mg by mouth every 8 (eight) hours as needed for nausea or vomiting.    [provider]  rosuvastatin (CRESTOR) 40 MG tablet Take 40 mg by mouth daily.    [provider]  Tafluprost, PF, 0.0015 % SOLN SMARTSIG:1 Drop(s) In Eye(s) Every Evening    [provider]    Allergies: Patient has no known allergies.    Review of Systems  Gastrointestinal:  Positive for abdominal pain.    Updated Vital Signs  Vitals:   12/07/23 1100 12/07/23 1101 12/07/23 1400  BP: (!) 155/92 (!) 166/93 138/81  Pulse: 100 97 92  Resp: (!) 22 17 19   Temp: 97.8 F (36.6 C)    TempSrc: Oral    SpO2: 98% 99% 99%  Weight: 63.5 kg    Height: 5' 5 (1.651 m)       Physical Exam Vitals and nursing note reviewed.  HENT:     Head: Normocephalic.  Eyes:     Extraocular Movements: Extraocular movements intact.  Cardiovascular:     Rate and Rhythm: Regular rhythm. Tachycardia present.     Heart sounds: Normal heart sounds.  Pulmonary:     Effort: Pulmonary effort is normal.     Breath sounds: Wheezing (diffuse expiratory) and rhonchi present.     Comments: Productive cough during auscultation Abdominal:     Palpations: Abdomen is soft.     Tenderness: There is abdominal tenderness (Mild TTP in LLQ). There is no guarding.  Musculoskeletal:     Cervical back: Normal range of motion.     Right lower leg: No edema.     Left lower leg: No edema.     Comments: Moves all extremities spontaneously without difficulty  Skin:    General: Skin is warm and dry.  Neurological:     Mental Status: She is alert and oriented to person, place, and time.     (all labs ordered are listed, but only abnormal results are displayed) Labs Reviewed  COMPREHENSIVE METABOLIC PANEL WITH GFR - Abnormal; Notable for the following components:      Result Value   Potassium 3.1 (*)    Glucose, Bld 132 (*)    GFR, Estimated 59 (*)    All other components within normal limits  URINALYSIS, ROUTINE W REFLEX MICROSCOPIC - Abnormal; Notable for the following components:   APPearance  CLOUDY (*)    Protein, ur 30 (*)    Bacteria, UA RARE (*)    All other components within normal limits  RESP PANEL BY RT-PCR (RSV, FLU A&B, COVID)  RVPGX2  URINE CULTURE  LIPASE, BLOOD  CBC WITH DIFFERENTIAL/PLATELET  BRAIN NATRIURETIC PEPTIDE  MAGNESIUM  TROPONIN I (HIGH SENSITIVITY)  TROPONIN I (HIGH SENSITIVITY)    EKG: EKG Interpretation Date/Time:  Tuesday December 07 2023 12:21:23 EDT Ventricular Rate:  87 PR Interval:  205 QRS Duration:  85 QT Interval:  359 QTC Calculation: 432 R Axis:   80  Text Interpretation: Sinus rhythm Multiform ventricular premature complexes  Abnormal R-wave progression, late transition Confirmed by Ruthe Cornet [phone]) on 12/07/2023 12:25:34 PM  Radiology: DG Chest Portable 1 View Result Date: 12/07/2023 CLINICAL DATA:  Shortness of breath on exertion.  History of COPD EXAM: PORTABLE CHEST 1 VIEW COMPARISON:  X-ray 10/27/2020 FINDINGS: Hyperinflation. Stable blunting of the costophrenic angles, right greater than left. Small effusion versus pleural thickening. Chronic underlying lung changes as well consistent with history of COPD. Normal cardiopericardial silhouette. Calcified aorta. No pneumothorax, consolidation or edema. Overlapping cardiac leads. Degenerative changes are identified. IMPRESSION: No interval change. Hyperinflation with blunting of the costophrenic angles. Pleural effusion versus pleural thickening. Chronic lung changes. Electronically Signed   By: Ranell Bring M.D.   On: 12/07/2023 13:15     Procedures   Medications Ordered in the ED  oxyCODONE -acetaminophen  (PERCOCET/ROXICET) 5-325 MG per tablet 1 tablet (1 tablet Oral Given 12/07/23 1211)  potassium chloride  SA (KLOR-CON  M) CR tablet 40 mEq (40 mEq Oral Given 12/07/23 1357)                                    Medical Decision Making This patient presents to the ED for concern of abdominal pain/shortness of breath, this involves an extensive number of treatment options, and is  a complaint that carries with it a high risk of complications and morbidity.  The differential diagnosis includes constipation, COPD exacerbation, malignancy, fluid overload.   Co morbidities that complicate the patient evaluation  COPD, history of lung cancer   Additional history obtained:  Additional history obtained from record review External records from outside source obtained and reviewed including prior ED note   Lab Tests:  I Ordered, and personally interpreted labs.  The pertinent results include: CBC within normal limits.  CMP notable for mild hypokalemia with potassium of 3.1, otherwise unremarkable.  Lipase within normal limits.  Initial troponin of 9, repeat pending at time of shift change.  Urinalysis notable for some protein with rare bacteria, patient denies dysuria but does have history of recurrent urinary tract infections, will send for culture.  BNP within normal limits.   Imaging Studies ordered:  I ordered imaging studies including CXR, CT abdomen/pelvis   I independently visualized and interpreted imaging which showed  - CXR: No interval change. Hyperinflation with blunting of the costophrenic angles. Pleural effusion versus pleural thickening. Chronic lung changes. - CT PE study: pending at time of shift change - CT abdomen/pelvis: pending at time of shift change I agree with the radiologist interpretation   Cardiac Monitoring: / EKG:  The patient was maintained on a cardiac monitor.  I personally viewed and interpreted the cardiac monitored which showed an underlying rhythm of: NSR   Problem List / ED Course / Critical interventions / Medication management  I ordered medication including Percocet for pain, potassium for hypokalemia Reevaluation of the patient after these medicines showed that the patient improved I have reviewed the patients home medicines and have made adjustments as needed   Social Determinants of Health:  Tobacco use   Test /  Admission - Considered:  Physical exam notable as above.  Patient endorses several months of burning abdominal pain, she also endorses several days of worsening dyspnea on exertion, she uses oxygen  at home as needed but has been needing it more frequently, on 2L [place] in the room currently, during my assessment she was tachycardic between 100-105, will proceed with CT PE study given these findings  as well as history of left lower extremity DVT on Eliquis. See above for labs/imaging results.   Patient handed off to oncoming PA-C Ariel Zelaya at time of shift change pending repeat troponin, CT PE study, CT abdomen/pelvis.    Amount and/or Complexity of Data Reviewed Labs: ordered. Radiology: ordered.  Risk Prescription drug management.        Final diagnoses:  None    ED Discharge Orders     None          Glendia Rocky LOISE DEVONNA 12/07/23 1510    Ruthe Cornet, DO 12/07/23 1516

## 2023-12-08 ENCOUNTER — Ambulatory Visit (INDEPENDENT_AMBULATORY_CARE_PROVIDER_SITE_OTHER): Admitting: Gastroenterology

## 2023-12-08 ENCOUNTER — Encounter: Payer: Self-pay | Admitting: Gastroenterology

## 2023-12-08 VITALS — BP 116/70 | HR 107 | Ht 65.0 in | Wt 140.0 lb

## 2023-12-08 DIAGNOSIS — R1030 Lower abdominal pain, unspecified: Secondary | ICD-10-CM | POA: Diagnosis not present

## 2023-12-08 DIAGNOSIS — R1310 Dysphagia, unspecified: Secondary | ICD-10-CM | POA: Diagnosis not present

## 2023-12-08 DIAGNOSIS — K219 Gastro-esophageal reflux disease without esophagitis: Secondary | ICD-10-CM

## 2023-12-08 DIAGNOSIS — R195 Other fecal abnormalities: Secondary | ICD-10-CM | POA: Diagnosis not present

## 2023-12-08 DIAGNOSIS — K5900 Constipation, unspecified: Secondary | ICD-10-CM | POA: Diagnosis not present

## 2023-12-08 DIAGNOSIS — K449 Diaphragmatic hernia without obstruction or gangrene: Secondary | ICD-10-CM | POA: Diagnosis not present

## 2023-12-08 MED ORDER — OMEPRAZOLE 40 MG PO CPDR: 40.0000 mg | DELAYED_RELEASE_CAPSULE | Freq: Two times a day (BID) | ORAL | 1 refills | Status: AC

## 2023-12-08 NOTE — Patient Instructions (Addendum)
 _______________________________________________________  If your blood pressure at your visit was 140/90 or greater, please contact your primary care physician to follow up on this.  _______________________________________________________  If you are age 76 or older, your body mass index should be between 23-30. Your Body mass index is 23.3 kg/m. If this is out of the aforementioned range listed, please consider follow up with your Primary Care Provider.  If you are age 3 or younger, your body mass index should be between 19-25. Your Body mass index is 23.3 kg/m. If this is out of the aformentioned range listed, please consider follow up with your Primary Care Provider.   ________________________________________________________  The  GI providers would like to encourage you to use MYCHART to communicate with providers for non-urgent requests or questions.  Due to long hold times on the telephone, sending your provider a message by [facility] may be a faster and more efficient way to get a response.  Please allow 48 business hours for a response.  Please remember that this is for non-urgent requests.  _______________________________________________________  [facility] is using a team-based approach to care.  Your team is made up of your doctor and two to three APPS. Our APPS (Nurse Practitioners and Physician Assistants) work with your physician to ensure care continuity for you. They are fully qualified to address your health concerns and develop a treatment plan. They communicate directly with your gastroenterologist to care for you. Seeing the Advanced Practice Practitioners on your physician's team can help you by facilitating care more promptly, often allowing for earlier appointments, access to diagnostic testing, procedures, and other specialty referrals.    Continue protonix . Do miralax 17g 2 times a  day until large BM and then daily  You have been scheduled for an  endoscopy and colonoscopy. Please follow the written instructions given to you at your visit today.  If you use inhalers (even only as needed), please bring them with you on the day of your procedure.  DO NOT TAKE 7 DAYS PRIOR TO TEST- Trulicity (dulaglutide) Ozempic, Wegovy (semaglutide) Mounjaro (tirzepatide) Bydureon Bcise (exanatide extended release)  DO NOT TAKE 1 DAY PRIOR TO YOUR TEST Rybelsus (semaglutide) Adlyxin (lixisenatide) Victoza (liraglutide) Byetta (exanatide) ___________________________________________________________________________  Thank you,  Dr. Lynnie Bring

## 2023-12-08 NOTE — Progress Notes (Signed)
 Chief Complaint: FU  Referring Provider:  Stephanie Charlene CROME, MD      ASSESSMENT AND PLAN;   #1. H+ stools  #2. GERD with small HH with occ dysphagia d/t presbyesophagus s/p multiple eso dilatations previously. Last dil 54Fr 07/2020  #3. Lower abdo pain with neg CT AP 06/2021  #4. H/O Merkel cell carcinoma (unknown primary, followed by Dr Tiajuana), H/O Lung AdenoCa s/p RUL lobectomy 01/2015, another lung adeno CA  LLL lobe s/p wedge resection 07/2018.   #5. Associated COPD on home O2, 3V CAD, chronic neck and low back pain, HLD, anxiety.  Plan: -Omeprazole  40mg  po QD #90 4RF -EGD with dil/colon at [facility] (on Home O2) with 2 day prep, after holding eliquis x 2 days (clearence from Dr Stephanie) -Miralax 17g BID until a large BM -FU therafter   I discussed EGD/Colonoscopy- the indications, risks, alternatives and potential complications including, but not limited to bleeding, infection, reaction to meds, damage to internal organs, cardiac and/or pulmonary problems, and perforation requiring surgery. The possibility that significant findings could be missed was explained. All ? were answered. Pt consents to proceed. HPI:    Bailey Nguyen is a 78 y.o. female  With Merkel cell ca of unknown primary (Rxed with avelumab ), H/O COPD on O2, Ca lung as above, DVT off Eliquis  (Dr Tiajuana), TIA 11/2021, 3V CAD, chronic neck and low back pain, HLD, anxiety. History of Present Illness Bailey Nguyen is a 78 year old female with emphysema who presents with lower abdominal pain and constipation.  Seem in ED yesterday.  Had normal CBC, CMP, lipase.  Her EKG was negative.  Underwent CTA chest which showed COPD, scarring and was negative for PE.  Also underwent CT Abdo/pelvis which was unremarkable except for constipation.  For follow-up visit.  Also found to have heme positive stools.  Normal CBC with hemoglobin 13  She experiences severe abdominal pain, described as a burning and stinging  sensation in the lower abdomen, radiating to the vaginal area and rectum. The pain is sometimes accompanied by a sharp pain in the side and burning in the rectum, which is particularly distressing. A recent CT angiography did not reveal any clots.  She has a history of constipation, often going several days without a bowel movement despite using polyethylene glycol (Miralax) daily. She takes two cups in her coffee but still struggles with bowel movements, sometimes going four to five days without relief. When she does have a bowel movement, it is often loose and minimal. Imaging has shown a large amount of stool. She has a history of blood in her stool, confirmed by a stool sample test, although no bacteria were found. She is concerned about the possibility of a blockage due to the tightness in her abdomen and the presence of a large amount of stool seen on imaging.  She experiences difficulty swallowing, with food feeling 'hung up' and water sometimes coming back up.  Her past medical history includes emphysema, for which she was previously on home oxygen , and a history of lung surgery. She also has a history of diverticulosis, noted during a previous colonoscopy, but no diverticulitis. She is on Eliquis for a blood clot related to prior lymph node surgery.  Her family history is significant for pancreatic cancer, which her father had. She also mentions the recent loss of her son in a motorcycle accident, which has been emotionally challenging.     Has lost weight as below Wt Readings from Last  3 Encounters:  12/08/23 140 lb (63.5 kg)  12/07/23 139 lb 15.9 oz (63.5 kg)  12/01/23 140 lb (63.5 kg)       Previous GI work-up:  CT Abdo/pelvis with contrast 12/06/2023 with a CTA chest 1. No acute intrathoracic, abdominal, or pelvic pathology. No CT evidence of pulmonary artery embolus. 2. Sigmoid diverticulosis. No bowel obstruction. Normal appendix. 3. Aortic Atherosclerosis (ICD10-I70.0) and  Emphysema (ICD10-J43.9).     PET 08/08/2021 - Largely unchanged postsurgical changes within the left inguinal region status post lymph node dissection in September 2022. Residual ill-defined soft tissue is persistently FDG avid and may reflect inflammatory/postsurgical changes versus malignancy. Continued attention on follow-up.  -Similar multiple subcentimeter moderately FDG avid mediastinal lymph nodes favored to be reactive, continued attention on follow-up.  EGD with dil 07/09/2020 -Moderate gastritis. -Presbyesophagus s/p esophageal dilatation 54Fr  CT 06/17/2021 AP with contrast 1.No acute abnormality within the abdomen or pelvis.  2.Partially occlusive thrombus of the left common femoral vein.  There is no central propagation, as the left external and common iliac veins are ligated.  3.Additional postsurgical changes in the left groin are grossly unchanged. No new lymphadenopathy.  4.Extensive dilated pelvic veins with large cross-pelvic collaterals.  CT Abdo/pelvis with contrast 05/25/2020 at [facility] -New enlarged left inguinal and left external iliac lymph nodes concerning for metastatic disease.  Otherwise no intra-abdominal abnormalities. -Diverticulosis -Small low-attenuation areas in the liver as before.  CT chest 06/07/20 No pulmonary embolism.   Interval development of extensive pulmonary infiltrate compatible  with given history of COVID-19 pneumonia predominantly throughout  the left lung. Associated shotty mediastinal and left hilar  adenopathy is reactive in nature.   Moderate to severe emphysema.   Aortic Atherosclerosis (ICD10-I70.0) and Emphysema (ICD10-J43.9).   -S/P EGDs with dil in 2013, 2016 and 2018, 2020 with good relief  Had colonoscopy 09/2013-limited due to preparation, grossly negative except for moderate sigmoid diverticulosis and internal hemorrhoids. Does not want repeat colonoscopy until now.  CT Chest 04/2019 1. No evidence of local tumor  recurrence status post right upper lobectomy and medial left lower lobe wedge resection. 2. No findings of metastatic disease in the chest. 3. Three-vessel coronary atherosclerosis.  PET 06/2018 Abdomen/Pelvis: - Liver: Multiple subcentimeter hepatic hypodensities are unchanged from 2016 activity and exhibit no FDG uptake. Scattered parenchymal calcifications are noted. - Gallbladder: Surgically absent. - Spleen: No splenomegaly. No focal abnormalities. - Pancreas: No focal abnormality - Adrenal glands: Unremarkable - Kidneys: Unremarkable - GI Tract: Colonic diverticulosis. - GU Tract: Unremarkable - Adenopathy: None   ONCOLOGY HISTORY - 05/25/20; [facility] ED for above symptoms. CT abd/pelvis showed a new L inguinal and external iliac lymphadenopathy, the largest of which measures 4.9-cm.  -05/30/20; seen by PCP Dr. Charlene Single who noticed a mildly tender left inguinal mass midway between pubis symphysis and ASIS and was referred for biopsy  - 07/01/20; Dr. Ester Sides, interventional radiologist performed excisional biopsy of the left inguinal lymph node. Surgical pathology report was c/w poorly differentiated Merkel cell carcinoma [facility], [phone]).  - 07/25/20; Met with Dr. Alm Como in surgical oncology at [facility] who recommended whole body PET/CT scan.  -07/29/20; PET/CT whole body was significant for a new FDG-avid 0.4cm L apex nodule. Stable, scattered, mildly FDG-avid mediastinal lymph nodes. Bulky FDG-avid L external iliac chain 2.2-cm lymph node. Smaller 0.8-cm FDG-avid L external iliac chain lymph node. An up to 3.7-cm FDG-avid L inguinal lymph nodes - 08/15/20; first avelumab  infusion  -09/06/20; second  avelumab  infusion  -09/27/20; third avelumab  infusion -12/23/20; rechallenge with a single avelumab  infusion (5th infusion). -02/03/21; left femoroinguinal lymph node dissection; pathology c/w metastatic for merkel cell carcinoma 6.5-cm in greatest  dimension in 1/9 lymph node ([id number]).  Past Medical History:  Diagnosis Date   Anxiety    Arthritis    Back ache    Cataract    il cataracts removed   Chest wall pain    Chronic constipation    Clotting disorder (HCC)    COPD (chronic obstructive pulmonary disease) (HCC)    Coronary artery calcification seen on CT scan 01/01/2016   Diabetes mellitus without complication (HCC)    Diverticula of intestine    sigmond   Dyskinesia of esophagus    Emphysema of lung (HCC)    GERD (gastroesophageal reflux disease)    Glaucoma    History of COVID-19 05/2020   Hx of colonic polyps    Hx of Lyme disease    Hx of TIA (transient ischemic attack) and stroke    Hypercholesteremia    Lung cancer (HCC)    Removed upper right portion of lung    Neck pain    Neuromuscular disorder (HCC)    Osteoporosis    Oxygen  deficiency    Prediabetes    Splenic infarct    Stroke [facility])    TIA   Tobacco use disorder     Past Surgical History:  Procedure Laterality Date   BACK SURGERY     BIOPSY  07/09/2020   Procedure: BIOPSY;  Surgeon: Charlanne Groom, MD;  Location: [facility];  Service: Endoscopy;;   BIOPSY  03/17/2022   Procedure: BIOPSY;  Surgeon: Charlanne Groom, MD;  Location: [facility];  Service: Gastroenterology;;   BIOPSY  10/08/2022   Procedure: BIOPSY;  Surgeon: Charlanne Groom, MD;  Location: [facility];  Service: Gastroenterology;;   CHOLECYSTECTOMY     COLONOSCOPY  09/22/2013   Moderate predominantly sigmoid diverticulosis. Moderate internal hemorrhoids. Otherwise normal colonoscopy. Limited due to qualityt of prep/.   COLONOSCOPY WITH PROPOFOL  N/A 03/17/2022   Procedure: COLONOSCOPY WITH PROPOFOL ;  Surgeon: Charlanne Groom, MD;  Location: [facility];  Service: Gastroenterology;  Laterality: N/A;   ESOPHAGOGASTRODUODENOSCOPY  03/05/2017   Peptic esophageal stricture. Gastritis.    ESOPHAGOGASTRODUODENOSCOPY (EGD) WITH PROPOFOL  N/A 07/09/2020   Procedure:  ESOPHAGOGASTRODUODENOSCOPY (EGD) WITH PROPOFOL ;  Surgeon: Charlanne Groom, MD;  Location: [facility];  Service: Endoscopy;  Laterality: N/A;   ESOPHAGOGASTRODUODENOSCOPY (EGD) WITH PROPOFOL  N/A 03/17/2022   Procedure: ESOPHAGOGASTRODUODENOSCOPY (EGD) WITH PROPOFOL ;  Surgeon: Charlanne Groom, MD;  Location: [facility];  Service: Gastroenterology;  Laterality: N/A;   ESOPHAGOGASTRODUODENOSCOPY (EGD) WITH PROPOFOL  N/A 10/08/2022   Procedure: ESOPHAGOGASTRODUODENOSCOPY (EGD) WITH PROPOFOL ;  Surgeon: Charlanne Groom, MD;  Location: [facility];  Service: Gastroenterology;  Laterality: N/A;   LUNG REMOVAL, PARTIAL Right 01/2015   LUNG SURGERY Left 07/24/2018   MALONEY DILATION  07/09/2020   Procedure: MALONEY DILATION;  Surgeon: Charlanne Groom, MD;  Location: [facility];  Service: Endoscopy;;   AGAPITO DILATION  03/17/2022   Procedure: AGAPITO DILATION;  Surgeon: Charlanne Groom, MD;  Location: [facility];  Service: Gastroenterology;;   AGAPITO DILATION  10/08/2022   Procedure: AGAPITO HODGKIN;  Surgeon: Charlanne Groom, MD;  Location: [facility];  Service: Gastroenterology;;   POLYPECTOMY  03/17/2022   Procedure: POLYPECTOMY;  Surgeon: Charlanne Groom, MD;  Location: [facility];  Service: Gastroenterology;;   TUBAL LIGATION      Family History  Problem Relation Age of Onset   Coronary artery  disease Mother 1   Diabetes Father    Pancreatic cancer Father    Diabetes Sister    Diabetes Brother    Lung cancer Brother    Thyroid  cancer Daughter    Colon cancer Neg Hx    Esophageal cancer Neg Hx    Stomach cancer Neg Hx    Rectal cancer Neg Hx     Social History   Tobacco Use   Smoking status: Former    Current packs/day: 0.00    Types: Cigarettes    Quit date: 02/01/2015    Years since quitting: 8.8   Smokeless tobacco: Never  Vaping Use   Vaping status: Never Used  Substance Use Topics   Alcohol use: No    Alcohol/week: 0.0 standard drinks of alcohol   Drug use: No    Current  Outpatient Medications  Medication Sig Dispense Refill   albuterol  (VENTOLIN  HFA) 108 (90 Base) MCG/ACT inhaler Inhale 2 puffs into the lungs every 6 (six) hours as needed for wheezing or shortness of breath.     AMBULATORY NON FORMULARY MEDICATION Medication Name: GI Cocktail equal parts maalox, dicyclomine, and 2% viscous lidocaine  Take 15ml every 8 hours as needed 150 mL 2   AMBULATORY NON FORMULARY MEDICATION Medication Name: GI Cocktail equal parts maalox, dicyclomine, and 2% viscous lidocaine   Take 15ml every 8 hours as needed 150 mL 2   apixaban (ELIQUIS) 5 MG TABS tablet Take 5 mg by mouth 2 (two) times daily.     azithromycin (ZITHROMAX) 250 MG tablet Take 250 mg by mouth 3 (three) times a week.     budesonide  (PULMICORT ) 0.5 MG/2ML nebulizer solution Mix the contents of one vial with one teaspoon of honey and swallow twice daily. Do not eat or drink for 10-15 minutes afterward. 120 mL 1   CEQUA 0.09 % SOLN Apply 1 drop to eye 2 (two) times daily.     dupilumab  (DUPIXENT ) 300 MG/2ML prefilled syringe Inject 300 mg into the skin every 7 (seven) days. 24 mL 3   EPINEPHrine  0.3 mg/0.3 mL IJ SOAJ injection Inject 0.3 mg into the muscle as needed for anaphylaxis.     formoterol (PERFOROMIST) 20 MCG/2ML nebulizer solution Inhale 20 mcg into the lungs 2 (two) times daily as needed (wheezing).     fosfomycin (MONUROL) 3 g PACK Take 3 g by mouth once.     mirabegron ER (MYRBETRIQ) 50 MG TB24 tablet Take 50 mg by mouth daily.     nateglinide (STARLIX) 60 MG tablet Take 60 mg by mouth 2 (two) times daily with a meal. Take 1-30 minutes before meals     nystatin (MYCOSTATIN) 100000 UNIT/ML suspension Take by mouth.     omeprazole  (PRILOSEC) 40 MG capsule Take 1 capsule (40 mg total) by mouth in the morning and at bedtime. 60 capsule 3   oxyCODONE -acetaminophen  (PERCOCET/ROXICET) 5-325 MG tablet Take 1 tablet by mouth every 6 (six) hours as needed for severe pain (pain score 7-10). 6 tablet 0   OXYGEN   Inhale 2 L into the lungs at bedtime.     polyethylene glycol (MIRALAX / GLYCOLAX) packet Take 17 g by mouth daily as needed for constipation.     potassium chloride  (KLOR-CON ) 10 MEQ tablet Take 10 mEq by mouth daily.     pregabalin (LYRICA) 50 MG capsule Take 50 mg by mouth 2 (two) times daily.     promethazine  (PHENERGAN ) 25 MG tablet Take 12.5 mg by mouth every 8 (eight) hours as needed  for nausea or vomiting.     rosuvastatin (CRESTOR) 40 MG tablet Take 40 mg by mouth daily.     Tafluprost, PF, 0.0015 % SOLN SMARTSIG:1 Drop(s) In Eye(s) Every Evening     No current facility-administered medications for this visit.    No Known Allergies  Review of Systems:  Negative except for HPI.     Physical Exam:    BP 116/70 (BP Location: Left Arm, Patient Position: Sitting, Cuff Size: Normal)   Pulse (!) 107   Ht 5' 5 (1.651 m)   Wt 140 lb (63.5 kg)   BMI 23.30 kg/m  Filed Weights   12/08/23 1106  Weight: 140 lb (63.5 kg)   Constitutional:  Gets dyspneic on minimal exertion.  Cachectic female Psychiatric: Normal mood and affect. Behavior is normal. HEENT: Pupils normal.  Conjunctivae are normal. No scleral icterus.  No thrush. Neck supple.  Cardiovascular: Normal rate, regular rhythm. No edema Pulmonary/chest: Bilateral decreased breath sounds.  No wheezing or rhonchi. Abdominal: Soft, nondistended. Nontender. Bowel sounds active throughout. There are no masses palpable. No hepatomegaly. Left inguinal thickeness. Rectal:  defered Neurological: Alert and oriented to person place and time. Skin: Skin is warm and dry. No rashes noted.  Data Reviewed: I have personally reviewed following labs and imaging studies  CBC:    Latest Ref Rng & Units 12/07/2023   12:29 PM 12/01/2023    1:37 AM 10/08/2022    7:22 AM  CBC  WBC 4.0 - 10.5 K/uL 5.3  6.1    Hemoglobin 12.0 - 15.0 g/dL 85.9  86.3  85.3   Hematocrit 36.0 - 46.0 % 41.6  41.2  43.0   Platelets 150 - 400 K/uL 358  307       CMP:    Latest Ref Rng & Units 12/07/2023   12:29 PM 12/01/2023    1:37 AM 10/08/2022    7:22 AM  CMP  Glucose 70 - 99 mg/dL 867  855  879   BUN 8 - 23 mg/dL 10  10  10    Creatinine 0.44 - 1.00 mg/dL 9.00  9.08  9.39   Sodium 135 - 145 mmol/L 138  137  136   Potassium 3.5 - 5.1 mmol/L 3.1  3.3  3.9   Chloride 98 - 111 mmol/L 101  103  103   CO2 22 - 32 mmol/L 24  21    Calcium 8.9 - 10.3 mg/dL 9.3  9.1    Total Protein 6.5 - 8.1 g/dL 7.7  7.3    Total Bilirubin 0.0 - 1.2 mg/dL 0.8  0.7    Alkaline Phos 38 - 126 U/L 101  89    AST 15 - 41 U/L 17  16    ALT 0 - 44 U/L 11  10     CT Abdo/pelvis reviewed with the patient and reviewed independently.   Anselm Bring, MD 12/08/2023, 11:25 AM  Cc: Stephanie Charlene CROME, MD

## 2023-12-10 DIAGNOSIS — E7800 Pure hypercholesterolemia, unspecified: Secondary | ICD-10-CM | POA: Diagnosis not present

## 2023-12-10 DIAGNOSIS — B370 Candidal stomatitis: Secondary | ICD-10-CM | POA: Diagnosis not present

## 2023-12-10 DIAGNOSIS — Z86718 Personal history of other venous thrombosis and embolism: Secondary | ICD-10-CM | POA: Diagnosis not present

## 2023-12-10 DIAGNOSIS — Z23 Encounter for immunization: Secondary | ICD-10-CM | POA: Diagnosis not present

## 2023-12-10 DIAGNOSIS — E876 Hypokalemia: Secondary | ICD-10-CM | POA: Diagnosis not present

## 2023-12-10 DIAGNOSIS — Z9981 Dependence on supplemental oxygen: Secondary | ICD-10-CM | POA: Diagnosis not present

## 2023-12-10 DIAGNOSIS — J449 Chronic obstructive pulmonary disease, unspecified: Secondary | ICD-10-CM | POA: Diagnosis not present

## 2023-12-10 DIAGNOSIS — J9611 Chronic respiratory failure with hypoxia: Secondary | ICD-10-CM | POA: Diagnosis not present

## 2023-12-10 DIAGNOSIS — E559 Vitamin D deficiency, unspecified: Secondary | ICD-10-CM | POA: Diagnosis not present

## 2023-12-10 DIAGNOSIS — E1165 Type 2 diabetes mellitus with hyperglycemia: Secondary | ICD-10-CM | POA: Diagnosis not present

## 2023-12-10 DIAGNOSIS — R110 Nausea: Secondary | ICD-10-CM | POA: Diagnosis not present

## 2023-12-10 DIAGNOSIS — K219 Gastro-esophageal reflux disease without esophagitis: Secondary | ICD-10-CM | POA: Diagnosis not present

## 2023-12-23 ENCOUNTER — Ambulatory Visit: Admitting: Physician Assistant

## 2023-12-27 DIAGNOSIS — R300 Dysuria: Secondary | ICD-10-CM | POA: Diagnosis not present

## 2024-01-17 ENCOUNTER — Telehealth: Payer: Self-pay

## 2024-01-17 NOTE — Telephone Encounter (Signed)
 Per Dr Stephanie  Patient will hold Eliquis the day prior to the procedure and the day of the procedure and resume the day after the procedure. Patient made aware to stop 02-23-24 and voiced understanding

## 2024-01-18 DIAGNOSIS — R300 Dysuria: Secondary | ICD-10-CM | POA: Diagnosis not present

## 2024-01-18 DIAGNOSIS — R3915 Urgency of urination: Secondary | ICD-10-CM | POA: Diagnosis not present

## 2024-01-18 DIAGNOSIS — N398 Other specified disorders of urinary system: Secondary | ICD-10-CM | POA: Diagnosis not present

## 2024-01-18 DIAGNOSIS — N94819 Vulvodynia, unspecified: Secondary | ICD-10-CM | POA: Diagnosis not present

## 2024-01-18 DIAGNOSIS — N3281 Overactive bladder: Secondary | ICD-10-CM | POA: Diagnosis not present

## 2024-01-18 DIAGNOSIS — N763 Subacute and chronic vulvitis: Secondary | ICD-10-CM | POA: Diagnosis not present

## 2024-01-18 DIAGNOSIS — N952 Postmenopausal atrophic vaginitis: Secondary | ICD-10-CM | POA: Diagnosis not present

## 2024-01-18 DIAGNOSIS — R339 Retention of urine, unspecified: Secondary | ICD-10-CM | POA: Diagnosis not present

## 2024-01-27 DIAGNOSIS — H02409 Unspecified ptosis of unspecified eyelid: Secondary | ICD-10-CM | POA: Diagnosis not present

## 2024-01-27 DIAGNOSIS — R202 Paresthesia of skin: Secondary | ICD-10-CM | POA: Diagnosis not present

## 2024-01-27 DIAGNOSIS — R799 Abnormal finding of blood chemistry, unspecified: Secondary | ICD-10-CM | POA: Diagnosis not present

## 2024-01-27 DIAGNOSIS — G959 Disease of spinal cord, unspecified: Secondary | ICD-10-CM | POA: Diagnosis not present

## 2024-01-27 DIAGNOSIS — R200 Anesthesia of skin: Secondary | ICD-10-CM | POA: Diagnosis not present

## 2024-02-11 DIAGNOSIS — R300 Dysuria: Secondary | ICD-10-CM | POA: Diagnosis not present

## 2024-02-16 ENCOUNTER — Telehealth: Payer: Self-pay | Admitting: Gastroenterology

## 2024-02-16 NOTE — Telephone Encounter (Addendum)
 Procedure:Colonoscopy Procedure date: 02/24/24 Procedure location: [facility] Arrival Time: 9:45 am Spoke with the patient Y/N: Yes Any prep concerns? No  Has the patient obtained the prep from the pharmacy ? Yes Do you have a care partner and transportation: Yes Any additional concerns? No

## 2024-02-17 ENCOUNTER — Encounter (HOSPITAL_COMMUNITY): Payer: Self-pay | Admitting: Gastroenterology

## 2024-02-17 NOTE — Progress Notes (Signed)
 Attempted to obtain medical history for pre op call via telephone, unable to reach at this time. HIPAA compliant voicemail message left requesting return call to pre surgical testing department.

## 2024-02-23 NOTE — Telephone Encounter (Signed)
 Inbound call from patient spouse requesting to cancel upcoming hospital procedure. No specific reason.

## 2024-02-23 NOTE — Telephone Encounter (Signed)
 Pt husband stated that the pt wanted to cancel the procedure for tomorrow.  No specific reason.  Will add back to wait list.  Scheduling contacted and procedure was canceled.   Routed as FYI

## 2024-02-23 NOTE — Telephone Encounter (Signed)
 Left message for pt to call back

## 2024-02-24 ENCOUNTER — Encounter (HOSPITAL_COMMUNITY): Admission: RE | Payer: Self-pay | Source: Home / Self Care

## 2024-02-24 ENCOUNTER — Ambulatory Visit (HOSPITAL_COMMUNITY): Admission: RE | Admit: 2024-02-24 | Source: Home / Self Care | Admitting: Gastroenterology

## 2024-02-24 SURGERY — COLONOSCOPY
Anesthesia: Monitor Anesthesia Care

## 2024-03-07 DIAGNOSIS — J9611 Chronic respiratory failure with hypoxia: Secondary | ICD-10-CM | POA: Diagnosis not present

## 2024-03-07 DIAGNOSIS — J449 Chronic obstructive pulmonary disease, unspecified: Secondary | ICD-10-CM | POA: Diagnosis not present

## 2024-03-09 DIAGNOSIS — H401132 Primary open-angle glaucoma, bilateral, moderate stage: Secondary | ICD-10-CM | POA: Diagnosis not present

## 2024-03-09 DIAGNOSIS — H04123 Dry eye syndrome of bilateral lacrimal glands: Secondary | ICD-10-CM | POA: Diagnosis not present

## 2024-03-24 DIAGNOSIS — C4A8 Merkel cell carcinoma of overlapping sites: Secondary | ICD-10-CM | POA: Diagnosis not present

## 2024-03-24 DIAGNOSIS — M792 Neuralgia and neuritis, unspecified: Secondary | ICD-10-CM | POA: Diagnosis not present

## 2024-03-24 DIAGNOSIS — C4A9 Merkel cell carcinoma, unspecified: Secondary | ICD-10-CM | POA: Diagnosis not present
